# Patient Record
Sex: Female | Born: 1945
Health system: Southern US, Community
[De-identification: ages and names within clinical notes are randomized; demographics above are authoritative.]

## PROBLEM LIST (undated history)

## (undated) DIAGNOSIS — I10 Essential (primary) hypertension: Secondary | ICD-10-CM

## (undated) DIAGNOSIS — E0500 Thyrotoxicosis with diffuse goiter without thyrotoxic crisis or storm: Secondary | ICD-10-CM

## (undated) DIAGNOSIS — Z951 Presence of aortocoronary bypass graft: Secondary | ICD-10-CM

## (undated) DIAGNOSIS — I498 Other specified cardiac arrhythmias: Secondary | ICD-10-CM

## (undated) DIAGNOSIS — F329 Major depressive disorder, single episode, unspecified: Secondary | ICD-10-CM

## (undated) DIAGNOSIS — I2510 Atherosclerotic heart disease of native coronary artery without angina pectoris: Secondary | ICD-10-CM

## (undated) DIAGNOSIS — N183 Chronic kidney disease, stage 3 unspecified: Secondary | ICD-10-CM

## (undated) DIAGNOSIS — E7800 Pure hypercholesterolemia, unspecified: Secondary | ICD-10-CM

## (undated) DIAGNOSIS — J8410 Pulmonary fibrosis, unspecified: Secondary | ICD-10-CM

## (undated) DIAGNOSIS — I509 Heart failure, unspecified: Secondary | ICD-10-CM

## (undated) DIAGNOSIS — E119 Type 2 diabetes mellitus without complications: Secondary | ICD-10-CM

## (undated) DIAGNOSIS — F32A Depression, unspecified: Secondary | ICD-10-CM

## (undated) DIAGNOSIS — J45909 Unspecified asthma, uncomplicated: Secondary | ICD-10-CM

## (undated) DIAGNOSIS — I5030 Unspecified diastolic (congestive) heart failure: Secondary | ICD-10-CM

## (undated) DIAGNOSIS — E785 Hyperlipidemia, unspecified: Secondary | ICD-10-CM

## (undated) DIAGNOSIS — K219 Gastro-esophageal reflux disease without esophagitis: Secondary | ICD-10-CM

## (undated) HISTORY — DX: Pulmonary fibrosis, unspecified: J84.10

## (undated) HISTORY — DX: Presence of aortocoronary bypass graft: Z95.1

## (undated) HISTORY — DX: Thyrotoxicosis with diffuse goiter without thyrotoxic crisis or storm: E05.00

## (undated) HISTORY — DX: Depression, unspecified: F32.A

## (undated) HISTORY — DX: Type 2 diabetes mellitus without complications: E11.9

## (undated) HISTORY — DX: Major depressive disorder, single episode, unspecified: F32.9

## (undated) HISTORY — DX: Gastro-esophageal reflux disease without esophagitis: K21.9

## (undated) HISTORY — DX: Hyperlipidemia, unspecified: E78.5

## (undated) HISTORY — DX: Other specified cardiac arrhythmias: I49.8

## (undated) HISTORY — DX: Essential (primary) hypertension: I10

## (undated) HISTORY — DX: Pure hypercholesterolemia, unspecified: E78.00

---

## 2007-05-20 ENCOUNTER — Ambulatory Visit: Payer: Self-pay | Admitting: Cardiology

## 2007-05-21 ENCOUNTER — Inpatient Hospital Stay (HOSPITAL_COMMUNITY): Admission: AD | Admit: 2007-05-21 | Discharge: 2007-05-23 | Payer: Self-pay | Admitting: Internal Medicine

## 2007-05-21 ENCOUNTER — Ambulatory Visit: Payer: Self-pay | Admitting: Cardiology

## 2007-05-21 ENCOUNTER — Ambulatory Visit: Payer: Self-pay | Admitting: Internal Medicine

## 2007-05-22 ENCOUNTER — Ambulatory Visit: Payer: Self-pay | Admitting: Cardiothoracic Surgery

## 2007-05-22 ENCOUNTER — Encounter: Payer: Self-pay | Admitting: Cardiothoracic Surgery

## 2007-05-23 ENCOUNTER — Encounter: Payer: Self-pay | Admitting: Cardiothoracic Surgery

## 2007-05-29 ENCOUNTER — Inpatient Hospital Stay (HOSPITAL_COMMUNITY): Admission: RE | Admit: 2007-05-29 | Discharge: 2007-06-03 | Payer: Self-pay | Admitting: Cardiothoracic Surgery

## 2007-05-29 HISTORY — PX: CORONARY ARTERY BYPASS GRAFT: SHX141

## 2007-06-13 ENCOUNTER — Ambulatory Visit: Payer: Self-pay | Admitting: Cardiology

## 2007-06-18 ENCOUNTER — Ambulatory Visit: Payer: Self-pay | Admitting: Cardiology

## 2007-06-21 ENCOUNTER — Encounter: Admission: RE | Admit: 2007-06-21 | Discharge: 2007-06-21 | Payer: Self-pay | Admitting: Cardiothoracic Surgery

## 2007-06-21 ENCOUNTER — Ambulatory Visit: Payer: Self-pay | Admitting: Cardiothoracic Surgery

## 2007-08-22 ENCOUNTER — Encounter: Payer: Self-pay | Admitting: Cardiology

## 2007-08-27 ENCOUNTER — Ambulatory Visit: Payer: Self-pay | Admitting: Cardiology

## 2007-09-18 ENCOUNTER — Ambulatory Visit: Payer: Self-pay | Admitting: Cardiology

## 2007-10-03 ENCOUNTER — Ambulatory Visit: Payer: Self-pay | Admitting: Cardiology

## 2008-05-19 ENCOUNTER — Encounter: Payer: Self-pay | Admitting: Physician Assistant

## 2008-05-19 ENCOUNTER — Ambulatory Visit: Payer: Self-pay | Admitting: Cardiology

## 2008-06-02 ENCOUNTER — Encounter: Payer: Self-pay | Admitting: Physician Assistant

## 2008-06-09 IMAGING — CR DG CHEST 1V PORT
1 series · 1 of 1 positions shown · non-contrast
Comparison: 05/30/2007.

CLINICAL DATA: Status post CABG for coronary artery disease.

PORTABLE CHEST - 1 VIEW

[view not recorded]
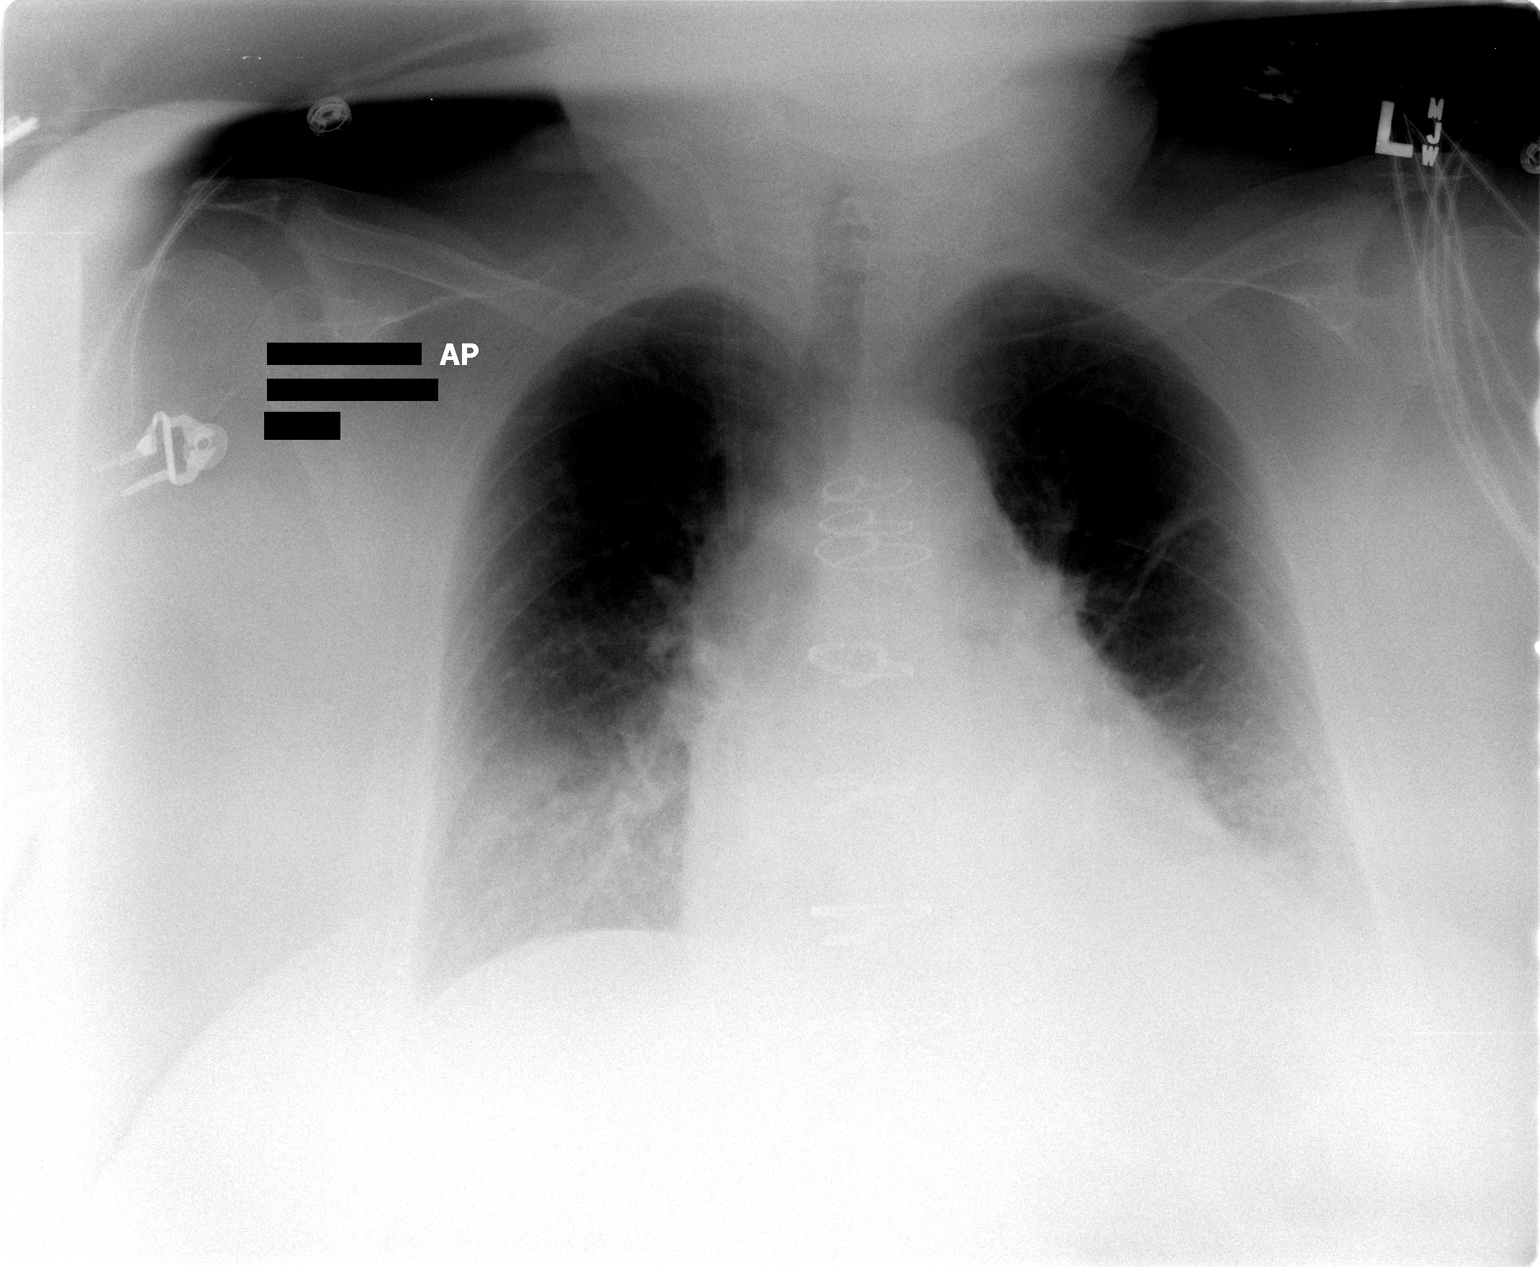

[1 of 1 positions shown; findings below may reference images not displayed]

FINDINGS: Interval removal of the mediastinal and left chest tubes, with no
pneumothorax. Right jugular Swan-Ganz catheter removal with the sheath
remaining. Interval mild left upper lung zone linear atelectasis. Mildly
enlarged cardiac silhouette. Post CABG changes. Stable mild prominence of the
interstitial markings. Increased left lower lobe airspace opacity.  

IMPRESSION

1. Increased left lower lobe atelectasis with interval mild left perihilar
atelectasis.
2. Mild cardiomegaly and mild chronic interstitial lung disease.

## 2009-03-22 ENCOUNTER — Ambulatory Visit: Payer: Self-pay | Admitting: Cardiology

## 2009-03-23 ENCOUNTER — Encounter: Payer: Self-pay | Admitting: Cardiology

## 2009-08-18 DIAGNOSIS — I498 Other specified cardiac arrhythmias: Secondary | ICD-10-CM

## 2009-08-18 DIAGNOSIS — E785 Hyperlipidemia, unspecified: Secondary | ICD-10-CM | POA: Insufficient documentation

## 2009-08-18 DIAGNOSIS — E0500 Thyrotoxicosis with diffuse goiter without thyrotoxic crisis or storm: Secondary | ICD-10-CM | POA: Insufficient documentation

## 2009-08-18 DIAGNOSIS — I10 Essential (primary) hypertension: Secondary | ICD-10-CM

## 2009-10-18 ENCOUNTER — Ambulatory Visit: Payer: Self-pay | Admitting: Cardiology

## 2009-10-18 DIAGNOSIS — J8410 Pulmonary fibrosis, unspecified: Secondary | ICD-10-CM

## 2009-10-18 DIAGNOSIS — R079 Chest pain, unspecified: Secondary | ICD-10-CM

## 2009-10-18 DIAGNOSIS — R609 Edema, unspecified: Secondary | ICD-10-CM | POA: Insufficient documentation

## 2009-10-19 ENCOUNTER — Encounter: Payer: Self-pay | Admitting: Cardiology

## 2009-10-29 ENCOUNTER — Encounter (INDEPENDENT_AMBULATORY_CARE_PROVIDER_SITE_OTHER): Payer: Self-pay | Admitting: *Deleted

## 2010-01-18 ENCOUNTER — Telehealth: Payer: Self-pay | Admitting: Cardiology

## 2010-01-19 ENCOUNTER — Encounter: Payer: Self-pay | Admitting: Cardiology

## 2010-01-19 DIAGNOSIS — E7800 Pure hypercholesterolemia, unspecified: Secondary | ICD-10-CM

## 2010-01-31 ENCOUNTER — Encounter: Payer: Self-pay | Admitting: Cardiology

## 2010-05-26 ENCOUNTER — Ambulatory Visit: Payer: Self-pay | Admitting: Cardiology

## 2010-05-26 ENCOUNTER — Encounter (INDEPENDENT_AMBULATORY_CARE_PROVIDER_SITE_OTHER): Payer: Self-pay | Admitting: *Deleted

## 2010-05-26 DIAGNOSIS — I2510 Atherosclerotic heart disease of native coronary artery without angina pectoris: Secondary | ICD-10-CM | POA: Insufficient documentation

## 2010-05-26 DIAGNOSIS — Z951 Presence of aortocoronary bypass graft: Secondary | ICD-10-CM

## 2010-10-24 ENCOUNTER — Encounter: Payer: Self-pay | Admitting: Cardiology

## 2010-12-16 ENCOUNTER — Encounter: Payer: Self-pay | Admitting: Cardiology

## 2010-12-28 NOTE — Letter (Signed)
Summary: Lexiscan or Dobutamine Pharmacist, community at [facility]  [address]   [place], [place] [address]   Phone: [phone]  Fax: [phone]      [facility]  Lexiscan or Dobutamine Cardiolite Strss Test    [facility]  Appointment Date:_  Appointment Time:_  Your doctor has ordered a CARDIOLITE STRESS TEST using a medication to stimulate exercise so that you will not have to walk on the treadmill to determine the condition of your heart during stress. If you take blood pressure medication, ask your doctor if you should take it the day of your test. You should not have anything to eat or drink at least 4 hours before your test is scheduled, and no caffeine, including decaffeinated tea and coffee, chocolate, and soft drinks for 24 hours before your test.  You will need to register at the Outpatient/Main Entrance at the hospital 15 minutes before your appointment time. It is a good idea to bring a copy of your order with you. They will direct you to the Diagnostic Imaging (Radiology) Department.  You will be asked to undress from the waist up and given a hospital gown to wear, so dress comfortably from the waist down for example: Sweat pants, shorts, or skirt Rubber soled lace up shoes (tennis shoes)  Plan on about three hours from registration to release from the hospital

## 2010-12-28 NOTE — Letter (Signed)
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 [address]   [place], [place] [address]   Phone: [phone]  Fax: [phone]    05/26/2010  TO: Leodis Sias IT MAY CONCERN   RE: [name] [address] [id number]   The above named individual is under my medical care and may return to work on:  Friday, June30, 2011.  She was seen for her 6 month follow up.   If you have any further questions or need additional information, please call.     Sincerely,    Hoover Brunette, LPN

## 2010-12-28 NOTE — Assessment & Plan Note (Signed)
Summary: 6 mo fu per may reminder-srs   Visit Type:  Follow-up Primary Provider:  Hasanaj   History of Present Illness: the patient is a 65 year old female with a history of coronary arteries, status post coronary bypass grafting. During her last office visit we discontinued her beta blocker due to bradycardia. Unfortunately her blood pressure has increased. She denies however any chest pain. 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 She is also not very active and most of the time sits at her job.she also has noticed some swelling in the lower extremities particular towards the evening. She denies any palpitations or syncope. The patient has diabetes and dyslipidemia which is followed by Dr. Olena Leatherwood.  Preventive Screening-Counseling & Management  Alcohol-Tobacco     Smoking Status: quit     Year Quit: 2003  Current Medications (verified): 1)  Lisinopril-Hydrochlorothiazide 20-25 Mg Tabs (Lisinopril-Hydrochlorothiazide) .... Take 1 Tablet By Mouth Once A Day 2)  Metformin Hcl 850 Mg Tabs (Metformin Hcl) .... Take 1 Tablet By Mouth Two Times A Day 3)  Simvastatin 40 Mg Tabs (Simvastatin) .... Take 1 Tablet By Mouth Once A Day 4)  Glimepiride 4 Mg Tabs (Glimepiride) .... Take 1 Tablet By Mouth Once A Day 5)  Aspirin 81 Mg Tbec (Aspirin) .... Take 1 Tablet By Mouth Once A Day 6)  Meloxicam 15 Mg Tabs (Meloxicam) .... Take 1 Tablet By Mouth Once A Day 7)  Fish Oil 1000 Mg Caps (Omega-3 Fatty Acids) .... Take 1 Tablet By Mouth Once A Day 8)  Ocuvite Preservision  Tabs (Multiple Vitamins-Minerals) .... Take 1 Tablet By Mouth Once A Day 9)  Levemir Flexpen 100 Unit/ml Soln (Insulin Detemir) .... Two Times A Day As Directed 10)  Pepcid 20 Mg Tabs (Famotidine) .... Take 1 Tablet By Mouth Once A Day 11)  Ibuprofen 200 Mg Tabs  (Ibuprofen) .... Take 1 Tablet By Mouth Once A Day 12)  Nitrostat 0.4 Mg Subl (Nitroglycerin) .... Use As Directed 13)  Isosorbide Mononitrate Cr 30 Mg Xr24h-Tab (Isosorbide Mononitrate) .... Take 1 Tablet By Mouth Once A Day 14)  Amlodipine Besylate 5 Mg Tabs (Amlodipine Besylate) .... Take 1 Tablet By Mouth Once A Day  Allergies (verified): No Known Drug Allergies  Comments:  Nurse/Medical Assistant: The patient's medication bottles and allergies were reviewed with the patient and were updated in the Medication and Allergy Lists.  Past History:  Past Medical History: Last updated: 10/18/2009 1. Gastroesophageal reflux disease with dysphagia and odynophagia,     resolved post-esophageal dilatation. 2. Coronary artery disease, status post coronary bypass graft in July     2008.     a.     Left internal mammary artery to the left anterior descending      artery, vein graft to the ramus intermediate and obtuse marginal,      vein graft to posterior descending artery. 3. Preserved left ventricular function with an ejection fraction of 60-     65%. 4. Hypertension. 5. Diabetes mellitus. 6. Dyslipidemia. 7. Depression. BRADYCARDIA (ICD-427.89) HYPERLIPIDEMIA-MIXED (ICD-272.4) HYPERTENSION, UNSPECIFIED (ICD-401.9) HYPERTHYROIDISM (GRAVE'S) (ICD-242.00) Depression Diabetes mellitus GERD  Family History: Last updated: 08/18/2009 Family History of Cancer:  Family History of Coronary Artery Disease:   Social History: Last updated: 08/18/2009 Full Time Married  Tobacco Use - Former.  Alcohol Use - no  Risk Factors: Smoking Status: quit (05/26/2010)  Review of Systems       The patient complains of shortness of breath.  The patient denies fatigue, malaise, fever, weight gain/loss, vision loss, decreased hearing, hoarseness, chest pain, palpitations, prolonged cough, wheezing, sleep apnea, coughing up blood, abdominal pain, blood in stool, nausea, vomiting, diarrhea,  heartburn, incontinence, blood in urine, muscle weakness, joint pain, leg swelling, rash, skin lesions, headache, fainting, dizziness, depression, anxiety, enlarged lymph nodes, easy bruising or bleeding, and environmental allergies.    Vital Signs:  Patient profile:   65 year old female Height:      60 inches Weight:      203 pounds O2 Sat:      97 % on Room air Pulse rate:   57 / minute BP sitting:   158 / 81  (left arm) Cuff size:   large  Vitals Entered By: Carlye Grippe (May 26, 2010 9:37 AM)  O2 Flow:  Room air  Physical Exam  Additional Exam:  General: Well-developed, well-nourished in no distress head: Normocephalic and atraumatic eyes PERRLA/EOMI intact, conjunctiva and lids normal nose: No deformity or lesions mouth normal dentition, normal posterior pharynx neck: Supple, no JVD.  No masses, thyromegaly or abnormal cervical nodes lungs: Normal breath sounds bilaterally without wheezing.  Normal percussion heart: regular rate and rhythm with normal S1 and S2, no S3 or S4.  PMI is normal.  No pathological murmurs abdomen: Normal bowel sounds, abdomen is soft and nontender without masses, organomegaly or hernias noted.  No hepatosplenomegaly musculoskeletal: Back normal, normal gait muscle strength and tone normal pulsus: Pulse is normal in all 4 extremities Extremities: No peripheral pitting edema neurologic: Alert and oriented x 3 skin: Intact without lesions or rashes cervical nodes: No significant adenopathy psychologic: Normal affect    Impression & Recommendations:  Problem # 1:  INTERSTITIAL LUNG DISEASE (ICD-515) no clear evidence of interstitial lung disease. The patient had a repeat chest x-ray done which only showed COPD.I do not think she needs a referral at the present time.  Problem # 2:  PURE HYPERCHOLESTEROLEMIA (ICD-272.0) follow up in Dr. Olena Leatherwood Her updated medication list for this problem includes:    Simvastatin 40 Mg Tabs (Simvastatin) .Marland Kitchen...  Take 1 tablet by mouth once a day  Problem # 3:  BRADYCARDIA (ICD-427.89) Assessment: Improved  The following medications were removed from the medication list:    Metoprolol Tartrate 25 Mg Tabs (Metoprolol tartrate) .Marland Kitchen... Take 1 tablet by mouth once a day Her updated medication list for this problem includes:    Lisinopril-hydrochlorothiazide 20-25 Mg Tabs (Lisinopril-hydrochlorothiazide) .Marland Kitchen... Take 1 tablet by mouth once a day    Aspirin 81 Mg Tbec (Aspirin) .Marland Kitchen... Take 1 tablet by mouth once a day    Nitrostat 0.4 Mg Subl (Nitroglycerin) ..... Use as directed    Isosorbide Mononitrate Cr 30 Mg Xr24h-tab (Isosorbide mononitrate) .Marland Kitchen... Take 1 tablet by mouth once a day    Amlodipine Besylate 5 Mg Tabs (Amlodipine besylate) .Marland Kitchen... Take 1 tablet by mouth once a day  Problem # 4:  HYPERTENSION, UNSPECIFIED (ICD-401.9) blood pressure appears to be uncontrolled. We will add amlodipine 5 mg p.o. q. day. The following medications were removed from the medication list:    Metoprolol Tartrate 25 Mg Tabs (Metoprolol tartrate) .Marland Kitchen... Take 1 tablet by mouth once a day Her updated medication list for this problem includes:    Lisinopril-hydrochlorothiazide 20-25 Mg Tabs (Lisinopril-hydrochlorothiazide) .Marland Kitchen... Take 1 tablet by mouth once a day    Aspirin 81 Mg Tbec (Aspirin) .Marland KitchenMarland KitchenMarland KitchenMarland Kitchen  Take 1 tablet by mouth once a day    Amlodipine Besylate 5 Mg Tabs (Amlodipine besylate) .Marland Kitchen... Take 1 tablet by mouth once a day  Problem # 5:  CORONARY ARTERY BYPASS GRAFT, HX OF (ICD-V45.81) the patient had coronary bypass grafting in 2008. She is due for a last stress study in 6 months.  Patient Instructions: 1)  Amlodipine 5 mg daily 2)  Lexiscan stress test in 6 months just before next office visit  3)  Follow up in  6 months Prescriptions: AMLODIPINE BESYLATE 5 MG TABS (AMLODIPINE BESYLATE) Take 1 tablet by mouth once a day  #30 x 6   Entered by:   Hoover Brunette, LPN   Authorized by:   Lewayne Bunting, MD, [facility]   Signed by:    Hoover Brunette, LPN on 16/08/9603   Method used:   Electronically to        [facility] (retail)       [address]       [place], [place]  [address]       Ph: [phone]       Fax: [phone]   RxID:   [id number]   Handout requested.

## 2010-12-28 NOTE — Miscellaneous (Signed)
Summary: Orders Update  Clinical Lists Changes  Problems: Added new problem of PURE HYPERCHOLESTEROLEMIA (ICD-272.0) Added new problem of ENCOUNTER FOR LONG-TERM USE OF OTHER MEDICATIONS (ICD-V58.69) Orders: Added new Test order of T-TSH [phone]) - Signed Added new Test order of T-T4, Free [phone]) - Signed

## 2010-12-28 NOTE — Progress Notes (Signed)
Summary: PHONE: THYROID TESTING  Phone Note Call from Patient Call back at Home Phone [phone]   Caller: Patient Summary of Call: Patient was told to call office in regards to having a thyroid test scheduled. She would like the order to be written and she wants to pick it up. She works for [employer] and she can get all blood work done free.  please call her [phone]. Initial call taken by: Zachary George,  January 18, 2010 2:56 PM  Follow-up for Phone Call        Order T4 and TSH Follow-up by: Lewayne Bunting, MD, [facility],  January 19, 2010 4:05 PM

## 2010-12-29 NOTE — Letter (Signed)
Summary: APPT WITH INST FOR LEXISCAN TEST  APPT WITH INST FOR LEXISCAN TEST   Imported By: Zachary George 12/16/2010 15:19:34  _____________________________________________________________________  External Attachment:    Type:   Image     Comment:   External Document

## 2010-12-29 NOTE — Miscellaneous (Signed)
Summary: Orders Update  Clinical Lists Changes  Orders: Added new Referral order of Nuclear Med (Nuc Med) - Signed 

## 2011-01-03 ENCOUNTER — Telehealth (INDEPENDENT_AMBULATORY_CARE_PROVIDER_SITE_OTHER): Payer: Self-pay | Admitting: *Deleted

## 2011-01-09 ENCOUNTER — Encounter: Payer: Self-pay | Admitting: Cardiology

## 2011-01-09 DIAGNOSIS — I2510 Atherosclerotic heart disease of native coronary artery without angina pectoris: Secondary | ICD-10-CM

## 2011-01-18 NOTE — Progress Notes (Signed)
Summary: stress test question  Phone Note Call from Patient Call back at cell:  [phone]   Summary of Call: Has bacterial infection in left lower shin due to her Diabetes.  PMD (Hasanaj) put on Ciprofloxacen 750mg  two times a day.  Will still be on ABO during time of her stress testing.    Just wanted to make sure okay and would not interfere with stress test.  Advised pt - should not be a problem, but will forward message to MD to be sure.  Having 2 day stress test on 2/13 & 14.  Patient verbalized understanding.  Initial call taken by: Hoover Brunette, LPN,  January 03, 2011 11:08 AM  Follow-up for Phone Call        Yes as long as she doesn't have a high-grade fever and significant pain the should not interfere with stress testing  Follow-up by: Lewayne Bunting, MD, [facility],  January 08, 2011 4:14 AM  Additional Follow-up for Phone Call Additional follow up Details #1::        Patient notified.    Additional Follow-up by: Hoover Brunette, LPN,  January 09, 2011 5:01 PM

## 2011-01-19 ENCOUNTER — Ambulatory Visit: Payer: Self-pay | Admitting: Cardiology

## 2011-03-16 ENCOUNTER — Encounter: Payer: Self-pay | Admitting: Cardiology

## 2011-03-17 ENCOUNTER — Ambulatory Visit (INDEPENDENT_AMBULATORY_CARE_PROVIDER_SITE_OTHER): Payer: 59 | Admitting: Cardiology

## 2011-03-17 VITALS — BP 130/70 | HR 85

## 2011-03-17 DIAGNOSIS — R609 Edema, unspecified: Secondary | ICD-10-CM

## 2011-03-17 DIAGNOSIS — I2510 Atherosclerotic heart disease of native coronary artery without angina pectoris: Secondary | ICD-10-CM

## 2011-03-17 DIAGNOSIS — K219 Gastro-esophageal reflux disease without esophagitis: Secondary | ICD-10-CM

## 2011-03-17 DIAGNOSIS — Z951 Presence of aortocoronary bypass graft: Secondary | ICD-10-CM

## 2011-03-17 NOTE — Patient Instructions (Signed)
   Your physician wants you to follow-up in: 6 months. You will receive a reminder letter in the mail one-two months in advance. If you don't receive a letter, please call our office to schedule the follow-up appointment.  Your physician recommends that you continue on your current medications as directed. Please refer to the Current Medication list given to you today. Your physician has requested that you have a lower or upper extremity venous duplex. This test is an ultrasound of the veins in the legs or arms. It looks at venous blood flow that carries blood from the heart to the legs or arms. Allow one hour for a Lower Venous exam. Allow thirty minutes for an Upper Venous exam. There are no restrictions or special instructions. If the results of your test are normal or stable, you will receive a letter. If they are abnormal, the nurse will contact you by phone.

## 2011-03-18 DIAGNOSIS — K219 Gastro-esophageal reflux disease without esophagitis: Secondary | ICD-10-CM | POA: Insufficient documentation

## 2011-03-18 NOTE — Progress Notes (Signed)
HPI The patient had a recent equivocal LV perfusion study. Ejection fraction was 70%. Study was read by Dr. Diona Browner. There was a small partially reversible defect in the apical anterior and apical septal segments. There was a small partially reversible defect in the basal inferolateral and mid inferolateral segments. This was possibly felt to soft tissue attenuation. Ischemia cannot be completely ruled out. The patient was last seen in June of 2011. She has a history of coronary artery disease and is status post coronary bypass grafting. In the past he had to stop her beta blocker due to bradycardia. Discuss increasing her blood pressure during her last office visit. Chest his of chronic stable shortness of breath on exertion mainly due to deconditioning. 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 She has a history of lower extremity edema in the evening. The patient feels very depressed because her brother died couple months ago. He also had heart disease. He has had open heart surgery x2. The patient however states that she doing well from a cardiovascular perspective. She denies any chest pain or shortness of breath. She has no orthopnea or PND. She has no palpitations or syncope. She does report ongoing swelling in the left lower extremity and some excoriations and raw skin on the left shin.  No Known Allergies  Current Outpatient Prescriptions on File Prior to Visit  Medication Sig Dispense Refill  . amLODipine (NORVASC) 5 MG tablet Take 5 mg by mouth daily.        Marland Kitchen aspirin 81 MG tablet Take 81 mg by mouth daily.        Marland Kitchen glimepiride (AMARYL) 4 MG tablet Take 4 mg by mouth daily before breakfast.        . ibuprofen (ADVIL,MOTRIN) 200 MG tablet Take 200 mg by mouth every 6 (six) hours as needed.        Marland Kitchen lisinopril-hydrochlorothiazide (PRINZIDE,ZESTORETIC) 20-25 MG per tablet Take 1 tablet by mouth daily.        . meloxicam  (MOBIC) 15 MG tablet Take 15 mg by mouth daily.        . Multiple Vitamins-Minerals (OCUVITE PRESERVISION PO) Take 1 tablet by mouth daily.        . nitroGLYCERIN (NITROSTAT) 0.4 MG SL tablet Place 0.4 mg under the tongue every 5 (five) minutes as needed.        . Omega-3 Fatty Acids (FISH OIL) 1000 MG CAPS Take 1 capsule by mouth 2 (two) times daily.       . simvastatin (ZOCOR) 40 MG tablet Take 40 mg by mouth at bedtime.          Past Medical History  Diagnosis Date  . Other specified cardiac dysrhythmias   . Other and unspecified hyperlipidemia   . Unspecified essential hypertension   . 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 Type II or unspecified type diabetes mellitus without mention of complication, not stated as uncontrolled   . GERD (gastroesophageal reflux disease)   . Unspecified essential hypertension   . Depression   . Postinflammatory pulmonary fibrosis   . Pure hypercholesterolemia   . Other specified cardiac dysrhythmias   . Postsurgical aortocoronary bypass status     Past Surgical History  Procedure Date  . Coronary artery bypass graft   05/29/2007     Family History  Problem Relation Age of Onset  . Cancer Other  Family Hx of Cancer  . Coronary artery disease Other     Family Hx of CAD    History   Social History  . Marital Status: Divorced    Spouse Name: N/A    Number of Children: N/A  . Years of Education: N/A   Occupational History  . RETIRED    Social History Main Topics  . Smoking status: Former Smoker    Quit date: 11/27/2001  . Smokeless tobacco: Not on file  . Alcohol Use: No  . Drug Use: No  . Sexually Active: Not on file   Other Topics Concern  . Not on file   Social History Narrative  . No narrative on file   [id number] positives as outlined above. The remainder of the 18  point review of systems is negative   PHYSICAL EXAM There were no vitals taken for this visit. General:  Well-developed, well-nourished in no distress Head: Normocephalic and atraumatic Eyes:PERRLA/EOMI intact, conjunctiva and lids normal Ears: No deformity or lesions Mouth:normal dentition, normal posterior pharynx Neck: Supple, no JVD.  No masses, thyromegaly or abnormal cervical nodes Lungs: Normal breath sounds bilaterally without wheezing.  Normal percussion Cardiac: regular rate and rhythm with normal S1 and S2, no S3 or S4.  PMI is normal.  No pathological murmurs Abdomen: Normal bowel sounds, abdomen is soft and nontender without masses, organomegaly or hernias noted.  No hepatosplenomegaly MSK: Back normal, normal gait muscle strength and tone normal Vascular: Pulse is normal in all 4 extremities Extremities: left lower extremity swelling Neurologic: Alert and oriented x 3 Skin: Intact without lesions or rashes Lymphatics: No significant adenopathy Psychologic: Normal affect   ECG:EKG normal sinus rhythm no acute abnormalities ASSESSMENT AND PLAN

## 2011-03-18 NOTE — Assessment & Plan Note (Signed)
Swelling left lower extremity we'll proceed with Dopplers to rule out DVT.

## 2011-04-11 NOTE — Assessment & Plan Note (Signed)
[facility]                          [facility] OFFICE NOTE   NAME:[name], [name]                    MRN:          [id number]  DATE:05/19/2008                            DOB:          1946/04/19    CARDIOLOGIST:  Learta Codding, MD,FACC   REASON FOR VISIT:  85-month followup.   PRESENT ILLNESS:  [name] is a 65 year old female patient with a  history of CAD status post CABG in July 2008 who presents to office  today for followup.  Overall, she is doing well.  She denies any anginal  symptoms, significant shortness of breath, orthopnea, PND, pedal edema,  syncope, or near-syncope.  She does note some palpitations; she has had  these for years.  They are usually in the mornings.  It sounds as though  she is describing premature atrial versus premature ventricular  contractions.  She denies any lightheadedness or near-syncope with  these.  She does note some fatigue.  This has been minimal over the last  several months.  She also notes symptoms of dysphagia and odynophagia.  She has had significant symptoms of dyspepsia several times a week for  quite some time now.  She takes Tums on an almost daily basis.  She  denies any hematemesis, melena, or hematochezia.  She notes that her son  was recently deployed to [place].  He is a Recruitment consultant and a  Engineer, technical sales with the [employer]   CURRENT MEDICATIONS:  1. Xanax 0.5 mg nightly.  2. Glucophage 850 mg b.i.d.  3. Simvastatin 40 mg nightly.  4. Glimepiride 4 mg daily.  5. Lisinopril HCTZ 20/12.5 mg daily.  6. Metoprolol tartrate 50 mg once daily.  7. Aspirin 81 mg daily.  8. PreserVision daily.  9. Sertraline 50 mg 2 tablets daily.   PHYSICAL EXAMINATION:  GENERAL:  She is a well-nourished, well-developed  female in no acute distress.  VITAL SIGNS:  Blood pressure is 125/66, pulse 51, and weight 206 pounds.  HEENT:  Normal.  NECK:  Without JVD.  CARDIAC:  Normal S1 and S2,  regular rate and rhythm.  LUNGS:  Clear to auscultation bilaterally.  ABDOMEN:  Soft, nontender.  EXTREMITIES:  Without edema.  NEUROLOGIC:  She is alert and oriented x3.  Cranial II-XII grossly  intact.  VASCULAR:  No carotid artery bruits noted bilaterally.   Electrocardiogram reveals sinus bradycardia, heart rate of 50, normal  axis, no acute changes.   IMPRESSION:  1. Gastroesophageal reflux disease with symptoms of dysphagia and      odynophagia.  2. Longstanding palpitations.      a.     Suspect premature atrial contractions versus premature       ventricular contractions  3. Coronary artery disease, status post coronary artery bypass graft      in July 2008.      a.     Left internal mammary artery to the left anterior       descending, vein graft to the ramus intermediate and obtuse       marginal, vein graft to the posterior descending.  4. Preserved left  ventricular function with an ejection fraction of 60-      65%.  5. Hypertension.  6. Diabetes mellitus.  7. Dyslipidemia.  8. Depression.   PLAN:  1. Ms. Huseby presents for routine followup.  She has had some      palpitations, but she has had this for years without change.  She      is currently taking her metoprolol 50 mg once a day.  This is      immediate-release formulation, and I have asked her to change this      to a half a tablet twice a day.  2. She seems to be asymptomatic with her sinus bradycardia.  She does      feel somewhat fatigued.  We will check labs as listed below.  If      she continues to feel fatigued on the change in her metoprolol we      can certainly change that to a lower dosage in the future.  She      knows to contact us.  3. Lab work will be done today:  CMET, CBC, TSH, magnesium, and a      lipid panel.  4. She has dysphagia associated with acid reflux disease.  I have      placed her on Protonix 40 mg a day and recommend that she see      Gastroenterology for further  recommendations.  We will facilitate      that referral.  5. The patient will return in 6 months for routine followup.  She      knows to contact us for sooner followup should she have a change in      her symptoms.      Tereso Newcomer, PA-C  Electronically Signed      Learta Codding, MD,FACC  Electronically Signed   SW/MedQ  DD: 05/19/2008  DT: 05/20/2008  Job #: [id number]

## 2011-04-11 NOTE — Assessment & Plan Note (Signed)
[facility]                          [facility]ICE NOTE   NAME:[name], [name]                    MRN:          [id number]  DATE:06/18/2007                            DOB:          Oct 13, 1946    REFERRING PHYSICIAN:  Lia Hopping   HISTORY OF PRESENT ILLNESS:  The patient is a 65 year old female with  diabetes who was seen at [facility] for new onset of substernal  chest pain. The patient was referred for catheterization and was found  to have significant coronary artery disease involving the left main  coronary artery and the LAD ostium. The patient was referred for  coronary bypass grafting. The patient underwent coronary artery bypass  grafting x4 using a left internal mammary artery to the LAD, sequential  saphenous vein, the ramus intermediate and obtuse marginals, saphenous  vein graft to posterior descending artery. The patient has been doing  well since that time. She denies any shortness of breath, orthopnea or  PND. She does have chest pain upon movement and pressure on the chest.  This appears to be related to a residual sternotomy pain. She also notes  that she gained seven pounds since she discontinued her Lasix.   MEDICATIONS:  1. Aspirin 325 mg a day.  2. Toprol XL 25 mg p.o. daily.  3. Ramipril 2.5 mg p.o. daily.  4. Crestor 10 mg p.o. daily.  5. Glimepiride 4 mg a day.  6. Xanax 0.5 mg a day.  7. Elavil 25 mg p.o. nightly.  8. Glucophage 850 mg p.o. b.i.d.   PHYSICAL EXAMINATION:  VITAL SIGNS: Blood pressure 127/82, heart rate 70  beats per minute, saturation is 95% on room air.  GENERAL: Well-nourished white female in no apparent distress.  NECK: Normal carotid upstroke. No carotid bruits.  HEENT: Normal.  LUNGS:  Clear bilaterally.  HEART: Regular rate and rhythm. Tenderness at the sternotomy site, but  no evidence of inflammation or drainage.  ABDOMEN: Soft.  EXTREMITIES: No cyanosis, clubbing or  edema.   PROBLEM LIST:  1. Coronary artery disease status post coronary artery bypass graft      May 29, 2007.  2. Preserved LV systolic function.  3. Multiple cardiac risk factors including diabetes mellitus,      hypertension and previous tobacco use.  4. Dyslipidemia.   PLAN:  1. The patient does demonstrate some weight gain. The patient will      need 10 mg of Lasix every day for the next 7-10 days. She will      monitor her weight and discontinue if she is back down to her dry      weight.  2. Will also provide the patient with additional pain relief as she      still has some residual sternotomy pain. I have written for a      prescription for Darvocet N100.  3. The patient will be referred for a chest x-ray and P.A. and lateral      post coronary artery bypass grafting and will be reviewed.  4. The patient will followup next month with Dr.  Donata Clay.  Learta Codding, MD,FACC  Electronically Signed    GED/MedQ  DD: 06/18/2007  DT: 06/18/2007  Job #: [id number]   cc:   Lia Hopping

## 2011-04-11 NOTE — Op Note (Signed)
[name], [name]           ACCOUNT NO.:  [id number]   MEDICAL RECORD NO.:  [id number]          PATIENT TYPE:  INP   LOCATION:  2309                         FACILITY:  [facility]   PHYSICIAN:  Kerin Perna, M.D.  DATE OF BIRTH:  12-22-1945   DATE OF PROCEDURE:  05/29/2007  DATE OF DISCHARGE:                               OPERATIVE REPORT   OPERATION:  1. Coronary artery bypass grafting x4 (left internal mammary artery to      the left anterior descending, sequential saphenous vein graft to      ramus intermediate and obtuse marginal, saphenous vein graft to      posterior descending).  2. Endoscopic vein harvest of the greater saphenous vein from the      right leg.   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   SURGEON:  Kerin Perna, M.D.   ASSISTANT:  Danelle Earthly, PA-C and Alm Bustard, [place]   ANESTHESIA:  General.   INDICATIONS:  The patient is a 65 year old obese diabetic with  exertional chest pain.  Cardiac catheterization by Dr. Tonny Bollman  demonstrated a distal 50% left main stenosis with a high-grade stenosis  of the LAD and ramus intermediate and moderate stenosis of the posterior  descending and the distal right coronary artery.  Overall EF was  preserved.  She is felt to be candidate for surgical evaluation.  Prior  to surgery I examined the patient in her hospital room and reviewed  results of the cardiac cath with the patient and family.  I discussed  indications and benefits coronary bypass surgery as well as  alternatives.  I discussed the major aspects of the operation including  the choice of conduit to include internal mammary artery and  endoscopically harvested saphenous vein, location of the surgical  incisions, use of general anesthesia and cardiopulmonary bypass, and  expected of postoperative recovery.  I reviewed the risks of MI, CVA,  bleeding, infection, and death.  The patient  demonstrated her  understanding and agreed to proceed with operation under what I felt was  an informed consent.   OPERATIVE FINDINGS:  The vein was of good quality.  It became fairly  small below the knee as it bifurcated.  The mammary artery was good  vessel with excellent flow.  The LAD was a somewhat short vessel that  bifurcated at the midseptum.  The distal right coronary had several  small branches in the posterolateral distribution which were too small  to graft.  These had approximately 50% stenosis according to the cath  report.  The patient did not require any blood transfusions for the  surgery.   PROCEDURE:  The patient was brought to operative room, placed the  operating table where general anesthesia was induced under invasive  hemodynamic monitoring.  The chest, abdomen and legs were prepped with  Betadine and draped as a sterile field.  The sternal incision was made  as the saphenous vein was harvested endoscopically.  The left internal  mammary artery was harvested as a pedicle graft from its origin at the  subclavian vessels and was a good vessel with excellent flow.  Heparin  was administered and the ACT was documented as being therapeutic.  The  sternal retractor was placed using the deep blades due to the patient's  obese morphology.  Exposure the heart and the ascending aorta was  difficult.  Pursestrings were placed in the ascending aorta and right  atrium.  The patient was cannulated.  After the vein was inspected and  found to be adequate the patient was placed on bypass.  The coronaries  were identified for grafting.  The LAD before its bifurcation, the ramus  intermediate just under the left atrial appendage, and the circumflex  marginal were found to be adequate grafts.  The posterior descending was  distally diseased but was an adequate target.  The vessels beyond the  posterior descending and the posterolateral distribution were too small  to graft.  A  cardioplegia catheter was placed in the ascending aorta.  The patient was cooled to 32 degrees.  Aortic crossclamp was applied and  800 mL of cold blood cardioplegia was delivered with good cardioplegic  arrest.  Cardioplegia was delivered every 20 minutes during the period  of crossclamp.   The distal coronary anastomoses were performed.  The first distal  anastomosis was to the mid posterior descending.  This had a proximal  70% stenosis of thick plaque.  A reverse saphenous vein sewn end-to-side  with running 7-0 Prolene.  There is good flow through graft.  The second  distal anastomosis was the sequential vein grafts during ramus  intermediate continuing to the obtuse marginal.  The ramus intermediate  was 1.5 mm vessel with proximal 80% stenosis.  A side-to-side  anastomosis with the vein was constructed with 7-0 Prolene.  The third  distal anastomosis was the continuation of the sequential vein in the  OM1.  This a 1.5-mm vessel with proximal 70% stenosis and the end of the  vein was sewn end-to-side with running 7-0 Prolene.  There was good flow  through the graft.  The fourth distal anastomosis was to the mid-LAD  before it bifurcated at the mid septum.  The left IMA pedicle was  brought through an opening in the left pericardium was brought down onto  the LAD and sewn end-to-side with running 8-0 Prolene.  There is good  flow through the anastomosis after briefly releasing the pedicle bulldog  on the mammary artery.  The bulldog was reapplied and the pedicle was  secured to epicardium.  Cardioplegia was redosed.   While the crossclamp still in place two proximal vein anastomoses were  performed on the ascending aorta using a 4.0-mm punch running 6-0  Prolene.  Prior to tying down the final proximal anastomosis, air was  vented from the coronaries with the usual de-airing maneuvers on bypass.  The crossclamp was then removed.   The heart resumed a spontaneous rhythm.  Air was  aspirated from the vein  grafts with 27 gauge needle.  The grafts were opened.  Each had good  flow and hemostasis was documented at the proximal distal sites.  The  patient was rewarmed to 37 degrees.  Temporary pacing wires were  applied.  The lungs re-expanded.  The ventilator was resumed.  The  patient was then weaned from bypass without difficulty.  Cardiac output  blood pressure stable.  Protamine was administered without adverse  reaction.  The cannulas were removed.  The mediastinum was irrigated  warm antibiotic irrigation.  Leg incision was  irrigated, closed in a  standard fashion.  The superior pericardial fat was closed over the  aorta.  Two mediastinal and left pleural chest tube were placed, brought  out through separate incisions.  The sternum was closed with interrupted  steel wire.  The pectoralis fascia was closed interrupted and running #1  Vicryl.  Subcutaneous and skin layers were closed in running Vicryl and  sterile dressings were applied.  Total bypass time was 120 minutes,  crossclamp time of 84 minutes.  Due to the patient's obese morphology  and difficult cardiac exposure, redo surgery would be at high risk if at  all possible.  No blood products were used for this operation.      Kerin Perna, M.D.  Electronically Signed     PV/MEDQ  D:  05/29/2007  T:  05/30/2007  Job:  [id number]   cc:   Veverly Fells. Excell Seltzer, MD

## 2011-04-11 NOTE — Assessment & Plan Note (Signed)
[facility]                          [facility]ICE NOTE   NAME:Nellums, Myya                    MRN:          [id number]  DATE:03/22/2009                            DOB:          1946/11/11    PRIMARY CARE PHYSICIAN:  Dr. Lia Hopping.   HISTORY OF PRESENT ILLNESS:  The patient is a 65 year old female with a  history of coronary artery disease and coronary bypass grafting.  The  patient is doing well from cardiac perspective.  She denies any chest  pain, shortness of breath, orthopnea, or PND.  She did have couple of  falls, but she stated it occur each time when she was stripping.  She  had no loss of consciousness.   MEDICATIONS:  1. Xanax 0.5 mg p.o. nightly.  2. Glucophage 850 mg p.o. b.i.d.  3. Simvastatin 40 mg p.o. nightly.  4. Glimepiride 4 mg p.o. daily.  5. Lisinopril and hydrochlorothiazide 20/12.5 mg p.o. daily.  6. Metoprolol tartrate 15 mg p.o. daily.  7. Aspirin 81 mg p.o. daily.  8. Sertraline 50 mg 1 tablet p.o. daily.   PHYSICAL EXAMINATION:  VITAL SIGNS:  Blood pressure 160/65, heart rate  46, respirations are 18, and weight is 207 pounds.  NECK:  Normal carotid upstroke.  No carotid bruits.  LUNGS:  Clear breath sounds bilaterally.  HEART:  Regular rate and rhythm.  Normal S1 and S2.  No murmur, rubs, or  gallops.  ABDOMEN:  Soft and nontender.  No rebound or guarding.  Good bowel  sounds.  EXTREMITIES:  No cyanosis, clubbing, or edema.   PROBLEMS:  1. Gastroesophageal reflux disease with dysphagia and odynophagia,      resolved post-esophageal dilatation.  2. Coronary artery disease, status post coronary bypass graft in July      2008.      a.     Left internal mammary artery to the left anterior descending       artery, vein graft to the ramus intermediate and obtuse marginal,       vein graft to posterior descending artery.  3. Preserved left ventricular function with an ejection fraction of 60-  65%.  4. Hypertension.  5. Diabetes mellitus.  6. Dyslipidemia.  7. Depression.  8. Bradycardia.   PLAN:  1. I have asked the patient to cut her metoprolol in quarters and take      12.5 in the morning and 12.5 in the evening.  She really does not      have symptomatic bradycardia, however.  2. The patient does not need any further ischemia testing.  At the      present time, she has no angina symptoms.  3. The patient does have borderline hypertension and I added 12.5 mg      of hydrochlorothiazide to her lisinopril and hydrochlorothiazide      20/12.5 mg a day, which then can be refilled as 20/25 in the      future.     Learta Codding, MD,FACC  Electronically Signed    GED/MedQ  DD: 03/22/2009  DT: 03/23/2009  Job #: [id number]  cc:   Stoney Bang

## 2011-04-11 NOTE — Discharge Summary (Signed)
[name], FLOOR           ACCOUNT NO.:  [id number]   MEDICAL RECORD NO.:  [id number]          PATIENT TYPE:  INP   LOCATION:  2025                         FACILITY:  [facility]   PHYSICIAN:  Kerin Perna, M.D.  DATE OF BIRTH:  [birth year]   DATE OF ADMISSION:  05/29/2007  DATE OF DISCHARGE:                               DISCHARGE SUMMARY   FINAL DIAGNOSES:  Left main stenosis with class III progressive angina.   HOSPITAL DIAGNOSES:  1. Volume overload postoperatively.  2. Acute blood loss anemia postoperatively.   SECONDARY DIAGNOSES:  1. Diabetes mellitus.  2. History of tobacco use, quit smoking 4 years ago.  3. Status post hysterectomy.  4. Questionable history of left leg deep vein thrombosis during early      pregnancy.   OPERATIONS AND PROCEDURES:  1. Coronary bypass grafting x4 using a left internal mammary artery to      left anterior descending, sequential saphenous vein graft to ramus      intermediate and obtuse marginal, saphenous vein graft to posterior      descending.   HISTORY AND PHYSICAL AND HOSPITAL COURSE:  The patient is a 65 year old  obese diabetic with exertional chest pain.  Cardiac catheterization done  by Dr. Excell Seltzer demonstrated 50% left main stenosis with high-grade  stenosis of the LAD and ramus intermediate and moderate stenosis of  posterior descending and distal right coronary artery.  Overall function  was preserved.  Following catheterization, Dr. Donata Clay was consulted.  Dr. Donata Clay saw and evaluated the patient.  He discussed with the  patient undergoing coronary bypass grafting.  Risks, benefits were  discussed with the patient.  The patient acknowledge her understanding  and agreed to proceed.  Surgery was scheduled for May 29, 2007.  For  details of the patient's past medical history and physical exam, please  see dictated H&P.   The patient was taken to the operating room May 29, 2007, where she  underwent coronary bypass  grafting x4 using a left internal mammary  artery to left anterior descending, sequential saphenous vein graft to  ramus intermediate and obtuse marginal, saphenous vein graft to  posterior descending.  Endoscopic vein harvesting of saphenous vein from  the right leg was done.  The patient tolerated this procedure well and  was transferred to the intensive care unit in stable condition.  Postoperatively, the patient was noted to be hemodynamically stable.  She was extubated evening of surgery.  Post extubation, the patient  noted to be alert and oriented x4, neurologically intact.  The patient's  postoperative course was pretty much unremarkable.   Postop day #1, the patient's vital signs were noted to be stable.  She  was able to be weaned from all drips.  Chest x-ray postoperatively was  stable with no significant effusion.  Chest tubes and catheter were  discontinued   By postop day #2, the patient was out of bed and ambulating well.  She  continued to progress.  She was transferred up to 2000 postop day #3.  During the patient's postoperative course, she did develop slight volume  overload  requiring several doses of Lasix.  The patient was back near  baseline prior to discharge home.  The patient also had acute blood loss  anemia with hemoglobin and hematocrit dropping to 10.8 and 31.5,  respectively, by postop day #4.  The patient was asymptomatic and  remained stable.  The patient's vital signs were continued to be  monitored.  They remained stable.  The patient remained afebrile.  She  was able to be weaned off oxygen, saturating greater than 90% on room  air.   Postoperatively, the patient's blood sugars were followed due to history  of diabetes mellitus.  She was initially started on Lantus insulin but  then restarted on the patient's diabetic p.o. medications..  The  patient's sugars remained stable.  The patient remained in normal sinus  rhythm postoperatively.  Her  pulmonary status remained stable.  She  continued to use her incentive spirometer.  The patient's incisions were  clean, dry and intact and healing well.   Labs on postop day #4 showed a white count 10.16, hemoglobin 7.8,  hematocrit 31.7, platelet count 222.  Sodium of 139, potassium 3.6,  chloride 96, bicarbonate 31, BUN 11, creatinine 0.78, glucose  85.   The patient is ready for discharge home today,  June 03, 2007, postop day  #5.   FOLLOWUP APPOINTMENTS:  A followup appointment has been arranged with  Dr. Donata Clay  for June 25, 2007, at 11 a.m.  The patient is to follow  up with Dr. Excell Seltzer in 2 weeks.  She will contact Dr. Earmon Phoenix office to  make this followup appointment.  Plan for the patient is to follow up in  [place].  She will have a 2-D echocardiogram done at that appointment for  evaluation.   ACTIVITY:  The patient instructed no driving until released to do so, no  lifting over 10 pounds.  The patient is told to ambulate 3-4 times a  day, progress as tolerated, and to continue her breathing exercises.   INCISIONAL CARE:  The patient is told to shower, washing her incisions  using soap and water.  She is to contact the office if she develops any  drainage or opening from her incision sites.   DIET:  The patient instructed on diet to be low-fat, low-salt as  carbohydrate-modified medium calorie diet.   DISCHARGE MEDICATIONS:  1. Toprol XL 25 mg daily.  2. Ramipril 2.5 mg daily.  3. Crestor 10 mg at night.  4. Glimepiride 4 mg daily.  5. Xanax 0.5 mg nightly.  6. Elavil 25 mg nightly.  7. Glucophage 850 mg b.i.d.  8. Lasix 40 mg daily x7 days.  9. Potassium chloride 20 mEq daily x7 days.  10.Oxycodone 5 mg 1-2 tablets q. 4-6 h. p.r.n. pain.      [name], [place]      Kerin Perna, M.D.  Electronically Signed    KMD/MEDQ  D:  06/03/2007  T:  06/03/2007  Job:  [id number]   cc:   Veverly Fells. Excell Seltzer, MD

## 2011-04-11 NOTE — Assessment & Plan Note (Signed)
[facility]                          [facility] NOTE   NAME:[name], [name]                    MRN:          [id number]  DATE:09/18/2007                            DOB:          January 09, 1946    REFERRING PHYSICIAN:  Lia Hopping, M.D.   HISTORY OF PRESENT ILLNESS:  The patient is a 65 year old female with a  history of diabetes mellitus and coronary artery disease.  The patient  is status post coronary artery bypass grafting by Kerin Perna, M.D.  The patient has been doing well.  She reports no substernal chest pain,  shortness of breath, orthopnea, or PND.  Unfortunately her blood  pressure is elevated in the office today. She also has noticed some  increasing peripheral edema.   MEDICATIONS:  1. Aspirin 325 mg daily.  2. Toprol XL 25 mg p.o. daily.  3. Ramipril 2.5 mg daily.  4. Glimepiride 4 mg p.o. daily.  5. Xanax 0.5 mg p.o. daily.  6. Glucophage 850 mg p.o. b.i.d.  7. Simvastatin 40 mg p.o. q.h.s.   PHYSICAL EXAMINATION:  VITAL SIGNS:  Blood pressure 165/80, heart rate  __________ beats per minute, weight 209 pounds.  NECK:  Normal carotid upstroke, no carotid bruits.  LUNGS:  Clear breath sounds bilaterally.  HEART:  Regular rate and rhythm.  Normal S1 and S2.  No murmurs, rubs,  or gallops.  ABDOMEN:  Soft and nontender with no rebound or guarding.  Good bowel  sounds.  EXTREMITIES:  No cyanosis, clubbing, or edema.  NEUROLOGY:  The patient is alert and oriented and grossly nonfocal.   PROBLEM LIST:  1. Coronary artery disease status post coronary artery bypass      grafting.  2. Preserved left ventricular function.  3. Cardiac risk factors.  4. Hypertension, uncontrolled.  5. Diabetes mellitus.  6. Dyslipidemia on statin drug therapy and followed by Dr. Olena Leatherwood.  7. Rule out foreign body reaction secondary to sternal suture.   PLAN:  1. The patient has a small area of tenderness around the sternotomy  site which may well be from a protruding suture.  This possibly      caused a foreign body reaction.  I told the patient to monitor this      for the next couple of weeks and if persistent we will refer her to      Dr. Kerin Perna, M.D.  2. The patient will have her blood pressure medication adjusted and we      will switch her to lisinopril 20/12.5 mg hydrochlorothiazide p.o.      daily.  3. The patient will come back in one week for a nurse visit to have      blood pressure check.  4. The patient can otherwise follow up with [place] in six months.     Learta Codding, MD,FACC  Electronically Signed    GED/MedQ  DD: 09/18/2007  DT: 09/19/2007  Job #: [id number]   cc:   Lia Hopping

## 2011-04-11 NOTE — Assessment & Plan Note (Signed)
OFFICE VISIT   TAYLORMARIE, REGISTER  DOB:  Apr 18, 1946                                        June 21, 2007  CHART #:  [id number]   Ms. Lamp underwent coronary artery grafting x4 on May 29, 2007. She  had a left internal mammary artery to the left anterior descending,  sequential saphenous graft to the ramus intermediate and obtuse  marginal, saphenous vein graft to posterior descending. The patient  tolerated this procedure well and her postoperative course was pretty  much unremarkable. She was discharged to home on postoperative day 5,  June 03, 2007.   The patient has followed up with Dr. Andee Lineman already. At the time of  follow up with her cardiologist, he started her back on Lasix daily due  to edema and a 7 pound weight gain. He also prescribed her 10 days worth  of pain medication.   The patient presents today for her follow up appointment. She states  that she is doing well. She still has slight discomfort in her sternal  incision, but she is taking pain medication.  She denies any shortness  of breath, cough, hemoptysis, palpitations, light headedness, or  dizziness. The patient is ambulating well. She is tolerating a regular  diet. She denies any drainage or opening from any of her incision sites.   PHYSICAL EXAMINATION:  VITAL SIGNS:  Blood pressure 128/78, pulse 64,  respirations 18, O2 saturations 94%.  RESPIRATORY:  Diminished breath sounds bilateral lower lobes.  CARDIAC:  Regular rate and rhythm with S1 and S2 noted.  ABDOMEN:  Bowel sounds x4. Soft and nontender.  EXTREMITIES:  Mild edema noted to the lower extremities. No cyanosis or  clubbing noted. All extremities are warm to touch.  INCISIONS:  All incisions are clean, dry, intact, and healing well.  There are no signs of cellulitis or drainage noted.   STUDIES:  The patient had a chest x-ray done today, June 21, 2007,  showing mild bilateral atelectasis. Otherwise stable. The  patient was to  have an echocardiogram on June 13, 2007. Report is currently not  available.   IMPRESSION:  Ms. Lapier is status post coronary artery bypass  grafting. She is doing well postoperatively. The patient was seen and  evaluated by Dr. Donata Clay.   PLAN:  Continue the patient on all medications. Dr. Donata Clay did  prescribe several more hydrocodone pain pills. The patient is still to  continue ambulating daily. She is told that she is allowed to bathe in a  bathtub and to continue washing her incisions with soap and water. She  is to contact us if she develops any drainage or opening from these  sites. The patient is released to drive. She currently has been told  that she can go back to work on September 11, 2007 if she remains stable.  The patient will be starting cardiac rehab in the next several weeks.  She is to follow up with Dr. Andee Lineman as he instructs. The patient will be  released from Dr. Zenaida Niece Trigt's office and will only need to follow up as  needed.   Kerin Perna, M.D.  Electronically Signed   KMD/MEDQ  D:  06/21/2007  T:  06/22/2007  Job:  [id number]   cc:   Learta Codding, MD,FACC

## 2011-04-11 NOTE — Consult Note (Signed)
[name], [name]           ACCOUNT NO.:  [id number]   MEDICAL RECORD NO.:  [id number]          PATIENT TYPE:  INP   LOCATION:  3733                         FACILITY:  [facility]   PHYSICIAN:  Kerin Perna, M.D.  DATE OF BIRTH:  [birth year]   DATE OF CONSULTATION:  05/22/2007  DATE OF DISCHARGE:                                 CONSULTATION   PRIMARY CARE PHYSICIAN:  Dr. __________ .   REASON FOR CONSULTATION:  Left main and coronary artery disease with  unstable angina.   CHIEF COMPLAINT:  Chest pain.   HISTORY OF PRESENT ILLNESS:  I was asked to evaluate this 65 year old  obese diabetic female for potential surgical coronary revascularization  for a recently diagnosed left main and multivessel coronary disease.  The patient has had symptoms of chest tightness over the left breast  during activity for over a month.  The pain usually resolves with rest.  She denies any nocturnal symptoms.  She was evaluated at [facility] where her cardiac enzymes were nondiagnostic, and she was  subsequently transferred to [facility] yesterday where she underwent  cardiac catheterization by Dr. Excell Seltzer.  This demonstrated a 60% distal  left main stenosis with an 80% ostial LAD stenosis and a 50% distal  stenosis of the right coronary with EF of 70%.  LVEDP of 10.  She was  felt to be a candidate for surgical coronary revascularization based on  her coronary anatomy and symptoms.   There is no history of MI.  Her EKG showed a sinus rhythm, and a 2-D  echo is pending.  Her Doppler shows her carotid arteries to be without  disease and ABIs are 1.0 in each lower extremity.   PAST MEDICAL HISTORY:  1. Diabetes mellitus.  2. History of smoking, quit 4 years ago.  3. Status post hysterectomy.  4. No known drug allergies.  5. Questionable history of left leg DVT during earlier pregnancy.   MEDICATIONS:  Xanax 0.5 q.h.s., Elavil 25 q.h.s. Crestor 10 mg daily,  HCTZ 12.5 a day, Amaryl 4  mg a day, Glucophage 850 t.i.d., Altace 2.5 mg  daily.   ALLERGIES:  None.   SOCIAL HISTORY:  She was a heavy smoker for several years but quit 4  years ago.  She does not use alcohol.  She is married.  She works at the  [employer].  She works 12-hour shifts 3 days a  week.   FAMILY HISTORY:  Positive for lung cancer, COPD and coronary disease.   REVIEW OF SYSTEMS:  CONSTITUTIONAL:  Review is negative fever, weight  loss. ENT:  Review is positive for total dental extraction with upper  and lower plates.  Negative for difficulty swallowing.  THORACIC:  Review is negative chest trauma or abnormal chest x-ray.  CARDIAC:  Review is positive for the findings noted above.  GI:  Review is  negative for blood per rectum, hepatitis or jaundice.  Her record  indicates she had a negative evaluation for gallstones.  ENDOCRINE:  Review is positive diabetes.  Negative for thyroid disease.  NEUROLOGIC:  Review is negative for  stroke or seizure.  VASCULAR:  Review is positive  for DVT history in the left leg, otherwise negative for claudication,  TIA.  BLEEDING:  Review is negative for blood transfusion or bleeding  disorder.   PHYSICAL EXAMINATION:  VITAL SIGNS:  The patient is 5 feet tall.  He is  205 pounds.  Blood pressure 130/80, pulse 80 regular.  GENERAL APPEARANCE:  That of a pleasant middle-aged female in hospital  room following cardiac cath, surrounded by family, in no distress.  HEENT:  Exam is normocephalic.  She is edentulous.  NECK:  Without JVD, mass or bruit.  Lymphatics reveal no palpable  supraclavicular adenopathy.  CHEST:  Without deformity and breath sounds are clear.  CARDIAC:  Exam reveals regular rhythm, no gallop or murmur.  ABDOMEN:  Abdominal exam was obese, soft without organomegaly.  EXTREMITIES:  Reveal positive pulses in all extremities.  No evidence of  venous insufficiency.  No edema, clubbing or cyanosis.  NEUROLOGIC:  Exam is  intact.   IMPRESSION AND PLAN:  The patient has a left main and ostial left  anterior descending (LAD) stenosis with preserved left ventricular (LV)  function.  Would benefit from bypass grafts to the left anterior  descending (LAD), diagonal, obtuse marginal (OM) and PDA.  We will  complete her preoperative evaluation and let the patient go home with a  leave of absence if it is satisfactory with the Cardiology Service and  then return for surgery on July 2.  Thank you for the consultation.      Kerin Perna, M.D.  Electronically Signed     PV/MEDQ  D:  05/22/2007  T:  05/23/2007  Job:  [id number]   cc:   Cecil Cranker, MD, [facility]  [facility] Office

## 2011-04-11 NOTE — Discharge Summary (Signed)
[name], Tanya Tanya           ACCOUNT NO.:  [id number]   MEDICAL RECORD NO.:  [id number]          PATIENT TYPE:  INP   LOCATION:  3733                         FACILITY:  [facility]   PHYSICIAN:  Veverly Fells. Excell Seltzer, MD  DATE OF BIRTH:  08-13-1946   DATE OF ADMISSION:  05/21/2007  DATE OF DISCHARGE:  05/23/2007                               DISCHARGE SUMMARY   PRIMARY CARDIOLOGIST:  Dr. Lewayne Bunting.   PRIMARY CARE Becki Mccaskill:  Dr. Olena Leatherwood in [place].   DISCHARGE DIAGNOSIS:  Unstable angina/coronary artery disease.   SECONDARY DIAGNOSES:  1. Hypertension.  2. Hyperlipidemia.  3. Type 2 diabetes mellitus.  4. Obesity.   ALLERGIES:  No known drug allergies.   PROCEDURE:  Left heart cardiac catheterization.   HISTORY OF PRESENT ILLNESS:  A 65 year old Caucasian female with prior  history of hypertension, hyperlipidemia and diabetes who was recently  admitted to [facility] following a one-month history of  exertional substernal chest discomfort, relieved with rest.  There she  was evaluated by [facility] cardiology and decision was made the transfer  to [facility] for further evaluation.   HOSPITAL COURSE:  Tanya Tanya underwent left heart cardiac  catheterization on June 24 showing a 60% distal left main stenosis as  well as an 80% osteo left anterior descending stenosis.  She otherwise  had nonobstructive coronary artery disease with an ejection fraction of  75%.  It was felt that she would best be managed with bypass surgery,  and was evaluated by Dr. Kathlee Nations Trigt who agreed.  Her surgery has  been scheduled for May 29, 2007.  She has not had any recurrent chest  discomfort and has been placed on aspirin beta blocker, statin, and ACE  inhibitor therapy.  She will be discharged home today with plans to  return on July 2 for coronary artery bypass grafting with Dr. Donata Clay.   DISCHARGE LABS:  Hemoglobin 13,8, hematocrit 40.7, WBC 8.3, platelets  195, MCV 86.8, sodium 132,  potassium 4.1, chloride 99, CO2 26, BUN 15,  creatinine 0.73, glucose 220, total cholesterol 150, triglycerides 230,  HDL 34, LDL 70, calcium 8.6.  Patient had PFTs on July 26 revealing an  FVC of 2.05 (74%) and an FEV1 of 1.43 (70%), an FEV1/FVC of 90%, an FEF  25-75.92 (37%).  These findings were consistent with moderate  obstructive airway disease.   DISPOSITION:  This patient is taking a leave of absence today in good  condition.  Follow up with an appointment.  She will re-present on July  2 for her bypass surgery.  We have a pre-arranged followup with Dr. Lewayne Bunting on July 22 at 1245 p.m.   DISCHARGE MEDICATIONS:  1. Aspirin 325 mg daily.  2. Ramipril 2.5 mg daily.  3. Toprol-XL 50 mg daily.  4. Crestor 10 at bedtime.  5. Nitroglycerin 0.4 mg sublingual p.r.n. chest pain.  6. Amaryl 4 mg daily.  7. Metformin 850 mg b.i.d.  8. Xanax 0.5 mg at bedtime.  9. __________ 25 mg at bedtime.   __________ .  None.   DURATION:  Discharge encountered 40 minutes  including physician time.      Nicolasa Ducking, ANP      Veverly Fells. Excell Seltzer, MD  Electronically Signed    CB/MEDQ  D:  05/23/2007  T:  05/23/2007  Job:  [id number]   cc:   Tanya Tanya, M.D.

## 2011-04-11 NOTE — Cardiovascular Report (Signed)
[name], [name]           ACCOUNT NO.:  [id number]   MEDICAL RECORD NO.:  [id number]          PATIENT TYPE:  INP   LOCATION:  3733                         FACILITY:  [facility]   PHYSICIAN:  Veverly Fells. Excell Seltzer, MD  DATE OF BIRTH:  Dec 27, 1945   DATE OF PROCEDURE:  05/21/2007  DATE OF DISCHARGE:                            CARDIAC CATHETERIZATION   PROCEDURE:  Left heart catheterization, selective coronary angiography,  left ventricular angiography, StarClose of the right femoral artery.   INDICATIONS:  [name] as a 65 year old woman who presented with  unstable angina.  She has multiple cardiac risk factors and was referred  for cardiac catheterization.  She has had chest pain over the last month  at rest and with exertion but her symptoms of been progressive.  Risks  and indications of procedure were explained to the patient.  Informed  consent was obtained.  The right groin was prepped, draped, anesthetized  1% lidocaine.  Using modified Seldinger technique, a 6-French sheath was  placed in the right femoral artery.  Multiple views of the left and  right coronary arteries were taken using standard 6-French Judkins  catheters.  Following selective coronary angiography, an angled pigtail  catheter was inserted into the left ventricle and pressures were  recorded.  The left ventriculogram was performed on pull-back across the  aortic valve was done.  At the conclusion of the procedure, a StarClose  device was used to seal the femoral arteriotomy.  All catheter exchanges  were performed over a guidewire.  There were no immediate complications.   FINDINGS:  Aortic pressure 115/52 with a mean of 76.  Left ventricular  pressure 122/10.   CORONARY ANGIOGRAPHY:  Left main is angiographically normal in its  proximal and mid portion.  It tapers distally and there is a 50-60%  stenosis in the distal left main.   The LAD is a large-caliber vessel in its proximal portion.  The LAD  only  extends down to the mid anterior wall.  There is a brick.  There is a  diagonal branch in the territory of a second diagonal that is the  dominant vessel.  There are two septal perforators.  The ostium of the  LAD has an 80% stenosis involving the true ostium.  It appears very  focal and is difficult to lay out and to lay out by angiography, but  there is clearly a high-grade stenosis there.   The ramus intermedius has 40% stenosis in the mid portion.  It is a  medium caliber vessel.  The left circumflex has no significant  angiographic disease.  It provides a second OM branch site that supplies  a second and third OM branch.  The AV groove circumflex is small.   The right coronary artery is diffusely diseased.  Proximal portion of  the vessel has luminal irregularities mid portion has a 40 at the site  30-40% stenosis.  The vessel branches into a PDA and also provides two  PL branches.  The PDA has a 50% stenosis in its mid portion.  The first  PL branch also has a 50% stenosis.  Left ventricular function assessed by 30-degree RAO left  ventriculography is hyperdynamic.  There are no regional wall motion  abnormalities.  The LVEF is 75%.   ASSESSMENT:  1. Moderate distal left main stenosis.  2. Severe ostial left anterior descending stenosis.  3. Nonobstructive right coronary artery stenosis.  4. Normal to hyperdynamic left ventricular function.   [name] has disease involving the distal left main and ostial left  anterior descending.  I think she requires surgical consultation for  coronary bypass.  If she has any resting symptoms, we will restart her  heparin.      Veverly Fells. Excell Seltzer, MD  Electronically Signed     MDC/MEDQ  D:  05/21/2007  T:  05/22/2007  Job:  [id number]

## 2011-09-12 LAB — CBC
HCT: 30.9 — ABNORMAL LOW
HCT: 31.7 — ABNORMAL LOW
HCT: 31.7 — ABNORMAL LOW
HCT: 31.7 — ABNORMAL LOW
HCT: 32.5 — ABNORMAL LOW
HCT: 32.8 — ABNORMAL LOW
HCT: 33.1 — ABNORMAL LOW
Hemoglobin: 10.3 — ABNORMAL LOW
Hemoglobin: 10.7 — ABNORMAL LOW
Hemoglobin: 10.8 — ABNORMAL LOW
Hemoglobin: 10.8 — ABNORMAL LOW
Hemoglobin: 11 — ABNORMAL LOW
Hemoglobin: 11 — ABNORMAL LOW
Hemoglobin: 11.1 — ABNORMAL LOW
MCHC: 33.3
MCHC: 33.4
MCHC: 33.6
MCHC: 33.8
MCHC: 33.9
MCHC: 33.9
MCHC: 34
MCV: 87.4
MCV: 87.4
MCV: 87.5
MCV: 87.6
MCV: 88
MCV: 88.2
MCV: 88.4
Platelets: 145 — ABNORMAL LOW
Platelets: 146 — ABNORMAL LOW
Platelets: 160
Platelets: 172
Platelets: 175
Platelets: 182
Platelets: 222
RBC: 3.53 — ABNORMAL LOW
RBC: 3.59 — ABNORMAL LOW
RBC: 3.63 — ABNORMAL LOW
RBC: 3.63 — ABNORMAL LOW
RBC: 3.69 — ABNORMAL LOW
RBC: 3.71 — ABNORMAL LOW
RBC: 3.78 — ABNORMAL LOW
RDW: 12.8
RDW: 12.9
RDW: 13
RDW: 13.2
RDW: 13.2
RDW: 13.3
RDW: 13.6
WBC: 10.6 — ABNORMAL HIGH
WBC: 12.6 — ABNORMAL HIGH
WBC: 12.8 — ABNORMAL HIGH
WBC: 14 — ABNORMAL HIGH
WBC: 14.1 — ABNORMAL HIGH
WBC: 15.3 — ABNORMAL HIGH
WBC: 17.5 — ABNORMAL HIGH

## 2011-09-12 LAB — POCT I-STAT 3, ART BLOOD GAS (G3+)
Acid-Base Excess: 1
Acid-base deficit: 1
Acid-base deficit: 1
Acid-base deficit: 4 — ABNORMAL HIGH
Acid-base deficit: 4 — ABNORMAL HIGH
Bicarbonate: 21.3
Bicarbonate: 21.6
Bicarbonate: 23.5
Bicarbonate: 24.1 — ABNORMAL HIGH
Bicarbonate: 25 — ABNORMAL HIGH
Bicarbonate: 25.6 — ABNORMAL HIGH
O2 Saturation: 100
O2 Saturation: 100
O2 Saturation: 100
O2 Saturation: 95
O2 Saturation: 97
O2 Saturation: 97
Operator id: 199901
Operator id: 199901
Operator id: 203371
Operator id: 3291
Operator id: 3291
Operator id: 3291
Patient temperature: 35.1
Patient temperature: 36
Patient temperature: 37.3
TCO2: 22
TCO2: 23
TCO2: 25
TCO2: 25
TCO2: 26
TCO2: 27
pCO2 arterial: 34.7 — ABNORMAL LOW
pCO2 arterial: 35.4
pCO2 arterial: 36.6
pCO2 arterial: 41.1
pCO2 arterial: 43.2
pCO2 arterial: 44.9
pH, Arterial: 7.329 — ABNORMAL LOW
pH, Arterial: 7.355
pH, Arterial: 7.36
pH, Arterial: 7.386
pH, Arterial: 7.429 — ABNORMAL HIGH
pH, Arterial: 7.443 — ABNORMAL HIGH
pO2, Arterial: 252 — ABNORMAL HIGH
pO2, Arterial: 320 — ABNORMAL HIGH
pO2, Arterial: 336 — ABNORMAL HIGH
pO2, Arterial: 73 — ABNORMAL LOW
pO2, Arterial: 82
pO2, Arterial: 94

## 2011-09-12 LAB — PROTIME-INR
INR: 1.5
Prothrombin Time: 18.5 — ABNORMAL HIGH

## 2011-09-12 LAB — BASIC METABOLIC PANEL
BUN: 11
BUN: 5 — ABNORMAL LOW
BUN: 6
BUN: 8
CO2: 25
CO2: 31
CO2: 31
CO2: 32
Calcium: 8.2 — ABNORMAL LOW
Calcium: 8.3 — ABNORMAL LOW
Calcium: 8.7
Calcium: 8.9
Chloride: 102
Chloride: 103
Chloride: 110
Chloride: 96
Creatinine, Ser: 0.63
Creatinine, Ser: 0.65
Creatinine, Ser: 0.76
Creatinine, Ser: 0.78
GFR calc Af Amer: >60
GFR calc Af Amer: >60
GFR calc Af Amer: >60
GFR calc Af Amer: >60
GFR calc non Af Amer: >60
GFR calc non Af Amer: >60
GFR calc non Af Amer: >60
GFR calc non Af Amer: >60
Glucose, Bld: 128 — ABNORMAL HIGH
Glucose, Bld: 64 — ABNORMAL LOW
Glucose, Bld: 85
Glucose, Bld: 90
Potassium: 3.5
Potassium: 3.6
Potassium: 3.7
Potassium: 4.2
Sodium: 135
Sodium: 139
Sodium: 139
Sodium: 139

## 2011-09-12 LAB — POCT I-STAT 4, (NA,K, GLUC, HGB,HCT)
Glucose, Bld: 106 — ABNORMAL HIGH
Glucose, Bld: 106 — ABNORMAL HIGH
Glucose, Bld: 110 — ABNORMAL HIGH
Glucose, Bld: 131 — ABNORMAL HIGH
Glucose, Bld: 147 — ABNORMAL HIGH
Glucose, Bld: 178 — ABNORMAL HIGH
Glucose, Bld: 92
Glucose, Bld: 95
HCT: 20 — ABNORMAL LOW
HCT: 21 — ABNORMAL LOW
HCT: 22 — ABNORMAL LOW
HCT: 24 — ABNORMAL LOW
HCT: 25 — ABNORMAL LOW
HCT: 28 — ABNORMAL LOW
HCT: 35 — ABNORMAL LOW
HCT: 39
Hemoglobin: 11.9 — ABNORMAL LOW
Hemoglobin: 13.3
Hemoglobin: 6.8 — CL
Hemoglobin: 7.1 — CL
Hemoglobin: 7.5 — CL
Hemoglobin: 8.2 — ABNORMAL LOW
Hemoglobin: 8.5 — ABNORMAL LOW
Hemoglobin: 9.5 — ABNORMAL LOW
Operator id: 203371
Operator id: 3291
Operator id: 3291
Operator id: 3291
Operator id: 3291
Operator id: 3291
Operator id: 3291
Operator id: 3291
Potassium: 3.5
Potassium: 3.5
Potassium: 3.6
Potassium: 3.8
Potassium: 3.9
Potassium: 4.3
Potassium: 4.4
Potassium: 4.4
Sodium: 134 — ABNORMAL LOW
Sodium: 137
Sodium: 137
Sodium: 137
Sodium: 138
Sodium: 138
Sodium: 139
Sodium: 140

## 2011-09-12 LAB — CARDIAC PANEL(CRET KIN+CKTOT+MB+TROPI)
CK, MB: 118.4 — ABNORMAL HIGH
Relative Index: 14.1 — ABNORMAL HIGH
Total CK: 839 — ABNORMAL HIGH
Troponin I: 7.76

## 2011-09-12 LAB — CREATININE, SERUM
Creatinine, Ser: 0.6
Creatinine, Ser: 0.76
GFR calc Af Amer: >60
GFR calc Af Amer: >60
GFR calc non Af Amer: >60
GFR calc non Af Amer: >60

## 2011-09-12 LAB — PLATELET COUNT: Platelets: 153

## 2011-09-12 LAB — I-STAT 8, (EC8 V) (CONVERTED LAB)
Acid-Base Excess: 1
BUN: 6
Bicarbonate: 26.4 — ABNORMAL HIGH
Chloride: 99
Glucose, Bld: 180 — ABNORMAL HIGH
HCT: 33 — ABNORMAL LOW
Hemoglobin: 11.2 — ABNORMAL LOW
Operator id: 274841
Potassium: 4
Sodium: 136
TCO2: 28
pCO2, Ven: 46
pH, Ven: 7.367 — ABNORMAL HIGH

## 2011-09-12 LAB — I-STAT EC8
Acid-base deficit: 4 — ABNORMAL HIGH
BUN: 7
Bicarbonate: 21.9
Chloride: 108
Glucose, Bld: 119 — ABNORMAL HIGH
HCT: 30 — ABNORMAL LOW
Hemoglobin: 10.2 — ABNORMAL LOW
Operator id: 199901
Potassium: 4.4
Sodium: 143
TCO2: 23
pCO2 arterial: 41.3
pH, Arterial: 7.332 — ABNORMAL LOW

## 2011-09-12 LAB — POCT I-STAT 3, VENOUS BLOOD GAS (G3P V)
Acid-base deficit: 2
Bicarbonate: 22.5
O2 Saturation: 83
Operator id: 3291
TCO2: 24
pCO2, Ven: 37.7 — ABNORMAL LOW
pH, Ven: 7.383 — ABNORMAL HIGH
pO2, Ven: 48 — ABNORMAL HIGH

## 2011-09-12 LAB — MAGNESIUM
Magnesium: 2.3
Magnesium: 2.7 — ABNORMAL HIGH
Magnesium: 3 — ABNORMAL HIGH

## 2011-09-12 LAB — HEMOGLOBIN AND HEMATOCRIT, BLOOD
HCT: 23 — ABNORMAL LOW
Hemoglobin: 8 — ABNORMAL LOW

## 2011-09-12 LAB — POCT I-STAT GLUCOSE
Glucose, Bld: 156 — ABNORMAL HIGH
Operator id: 114421

## 2011-09-12 LAB — APTT: aPTT: 30

## 2011-09-13 LAB — URINE MICROSCOPIC-ADD ON

## 2011-09-13 LAB — URINALYSIS, ROUTINE W REFLEX MICROSCOPIC
Bilirubin Urine: NEGATIVE
Glucose, UA: NEGATIVE
Hgb urine dipstick: NEGATIVE
Ketones, ur: NEGATIVE
Nitrite: POSITIVE — AB
Protein, ur: NEGATIVE
Specific Gravity, Urine: <1.005 — ABNORMAL LOW
Urobilinogen, UA: 0.2
pH: 6.5

## 2011-09-13 LAB — BLOOD GAS, ARTERIAL
Acid-Base Excess: 0.9
Bicarbonate: 25.2 — ABNORMAL HIGH
Drawn by: 206361
FIO2: 0.21
O2 Saturation: 96.7
Patient temperature: 98.6
TCO2: 26.5
pCO2 arterial: 42
pH, Arterial: 7.396
pO2, Arterial: 82.2

## 2011-09-13 LAB — TYPE AND SCREEN
ABO/RH(D): O POS
Antibody Screen: NEGATIVE

## 2011-09-13 LAB — CBC
HCT: 40.7
HCT: 42.9
Hemoglobin: 14.5
MCHC: 33.8
MCV: 86.8
MCV: 86.8
Platelets: 195
Platelets: 209
RBC: 4.94
RDW: 12.5
RDW: 12.9
WBC: 8.3
WBC: 8.9

## 2011-09-13 LAB — COMPREHENSIVE METABOLIC PANEL
ALT: 26
AST: 11
Albumin: 3.8
Alkaline Phosphatase: 103
BUN: 10
CO2: 23
Calcium: 9.4
Chloride: 105
Creatinine, Ser: 0.63
GFR calc Af Amer: >60
GFR calc non Af Amer: >60
Glucose, Bld: 180 — ABNORMAL HIGH
Potassium: 4.6
Sodium: 135
Total Bilirubin: 0.6
Total Protein: 6.6

## 2011-09-13 LAB — BASIC METABOLIC PANEL
BUN: 15
Chloride: 99
Creatinine, Ser: 0.73
GFR calc non Af Amer: >60
Glucose, Bld: 220 — ABNORMAL HIGH
Potassium: 4.1

## 2011-09-13 LAB — PROTIME-INR
INR: 1
Prothrombin Time: 13.4

## 2011-09-13 LAB — HEMOGLOBIN A1C: Hgb A1c MFr Bld: 9.3 — ABNORMAL HIGH

## 2011-09-13 LAB — LIPID PANEL
Cholesterol: 150
HDL: 34 — ABNORMAL LOW

## 2011-09-13 LAB — APTT: aPTT: 25

## 2011-09-13 LAB — ABO/RH: ABO/RH(D): O POS

## 2011-10-06 ENCOUNTER — Encounter: Payer: Self-pay | Admitting: Cardiology

## 2011-10-06 ENCOUNTER — Ambulatory Visit (INDEPENDENT_AMBULATORY_CARE_PROVIDER_SITE_OTHER): Payer: BC Managed Care – PPO | Admitting: Cardiology

## 2011-10-06 VITALS — BP 163/94 | HR 65 | Ht 60.0 in | Wt 198.0 lb

## 2011-10-06 DIAGNOSIS — Z951 Presence of aortocoronary bypass graft: Secondary | ICD-10-CM

## 2011-10-06 DIAGNOSIS — R609 Edema, unspecified: Secondary | ICD-10-CM

## 2011-10-06 DIAGNOSIS — I10 Essential (primary) hypertension: Secondary | ICD-10-CM

## 2011-10-06 MED ORDER — ACETAMINOPHEN 500 MG PO TABS: 1000.0000 mg | ORAL_TABLET | Freq: Four times a day (QID) | ORAL | Status: AC | PRN

## 2011-10-06 NOTE — Patient Instructions (Signed)
   Hold Ibuprofen and Mobic (as much as possible)  Tylenol ES 500mg  - may take 2 tabs every 6 hours as needed  Your physician wants you to follow up in: 6 months.  You will receive a reminder letter in the mail one-two months in advance.  If you don't receive a letter, please call our office to schedule the follow up appointment

## 2011-10-06 NOTE — Progress Notes (Signed)
CC: Routine followup coronary artery disease  HPI:  The patient is a 65 year old female with history of coronary arteries, status post coronary bypass grafting, diabetes mellitus and hypertension. The patient reports no chest pain or shortness of breath. She has no exertional symptoms. She does have some chest pressure only when lying down but without exertional component. She also refer ports reflux symptoms and stomach pain. She denies any melena hematochezia. She reports no orthopnea or PND.   PMH: reviewed and listed in Problem List in Electronic Records (and see below)  Allergies/SH/FHX : available in Electronic Records for review  Medications: Current Outpatient Prescriptions  Medication Sig Dispense Refill  . ALPRAZolam (XANAX) 0.5 MG tablet Take 0.5 mg by mouth at bedtime as needed.        Marland Kitchen amLODipine (NORVASC) 5 MG tablet Take 5 mg by mouth daily.        Marland Kitchen aspirin 81 MG tablet Take 81 mg by mouth daily.        Marland Kitchen glimepiride (AMARYL) 4 MG tablet Take 4 mg by mouth daily before breakfast.        . insulin glargine (LANTUS) 100 UNIT/ML injection Inject into the skin at bedtime.        Marland Kitchen lisinopril-hydrochlorothiazide (PRINZIDE,ZESTORETIC) 20-25 MG per tablet Take 1 tablet by mouth daily.        . metFORMIN (GLUCOPHAGE) 500 MG tablet Take 500 mg by mouth daily.        . Multiple Vitamins-Minerals (OCUVITE PRESERVISION PO) Take 1 tablet by mouth daily.        . nitroGLYCERIN (NITROSTAT) 0.4 MG SL tablet Place 0.4 mg under the tongue every 5 (five) minutes as needed.        . Omega-3 Fatty Acids (FISH OIL) 1000 MG CAPS Take 1 capsule by mouth 2 (two) times daily.       . ranitidine (ZANTAC) 150 MG tablet Take 150 mg by mouth 2 (two) times daily.        . simvastatin (ZOCOR) 40 MG tablet Take 40 mg by mouth at bedtime.        . traMADol (ULTRAM) 50 MG tablet Take 1 tablet by mouth Three times daily as needed.      . VESICARE 5 MG tablet Take 1 tablet by mouth Daily.      Marland Kitchen  acetaminophen (TYLENOL) 500 MG tablet Take 2 tablets (1,000 mg total) by mouth every 6 (six) hours as needed for pain.        ROS: No nausea or vomiting. No fever or chills.No melena or hematochezia.No bleeding.No claudication  Physical Exam: BP 163/94  Pulse 65  Ht 5' (1.524 m)  Wt 198 lb (89.812 kg)  BMI 38.67 kg/m2 General: Well-nourished white female no apparent distress. Neck: Normal carotid upstroke no carotid bruits. No thyromegaly nonnodular thyroid. JVP is 5 cm Lungs: Clear breath sounds bilaterally without wheezing Cardiac: Regular rate and rhythm with normal S1-S2 no murmur rubs or gallops Vascular: Trace  edema. Normal peripheral pulses bilaterally Skin: Warm and dry  12lead ECG: Limited bedside ECHO:N/A   Assessment and Plan

## 2011-10-06 NOTE — Assessment & Plan Note (Signed)
Possibly salt and water retention from nonsteroidals. Status post lower extremity Dopplers were negative for DVT

## 2011-10-06 NOTE — Assessment & Plan Note (Signed)
No recurrent chest pain. Patient awoke from a cardiac standpoint. Continue risk factor modification and asked to followup with her primary care physician regarding her lipid panel and additional bloodwork.

## 2011-10-06 NOTE — Assessment & Plan Note (Signed)
Blood pressure is poorly controlled and the patient is a combination of ibuprofen and meloxicam. I asked her to switch to high-dose Tylenol and used tramadol when necessary as needed. She also has some GI upset from her ibuprofen in addition I think this is driving her hypertension. The patient will get a blood pressure cuff in followup her blood pressure carefully.

## 2012-04-29 ENCOUNTER — Encounter: Payer: Self-pay | Admitting: Cardiology

## 2012-04-29 ENCOUNTER — Ambulatory Visit (INDEPENDENT_AMBULATORY_CARE_PROVIDER_SITE_OTHER): Payer: BC Managed Care – PPO | Admitting: Cardiology

## 2012-04-29 VITALS — BP 125/76 | HR 66 | Ht 60.0 in | Wt 196.0 lb

## 2012-04-29 DIAGNOSIS — E7800 Pure hypercholesterolemia, unspecified: Secondary | ICD-10-CM

## 2012-04-29 DIAGNOSIS — R079 Chest pain, unspecified: Secondary | ICD-10-CM

## 2012-04-29 DIAGNOSIS — Z951 Presence of aortocoronary bypass graft: Secondary | ICD-10-CM

## 2012-04-29 DIAGNOSIS — I2510 Atherosclerotic heart disease of native coronary artery without angina pectoris: Secondary | ICD-10-CM

## 2012-04-29 MED ORDER — ATORVASTATIN CALCIUM 80 MG PO TABS: 80.0000 mg | ORAL_TABLET | Freq: Every evening | ORAL | Status: DC

## 2012-04-29 NOTE — Progress Notes (Signed)
Tanya Bottoms, MD, [facility] ABIM Board Certified in Adult Cardiovascular Medicine,Internal Medicine and Critical Care Medicine    CC: Followup patient history of ischemic heart disease.  HPI:  The patient is doing well post coronary bypass grafting. She denies any angina or shortness of breath. She has good exercise tolerance. Unfortunately she tripped recently and fell on the right side. X-rays demonstrated no evidence of fracture but the patient is taking tramadol for pain. From a cardiac standpoint is otherwise stable. She denies any palpitations, presyncope or syncope.  PMH: reviewed and listed in Problem List in Electronic Records (and see below) Past Medical History  Diagnosis Date  . Other specified cardiac dysrhythmias   . Other and unspecified hyperlipidemia   . Unspecified essential hypertension   . 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 Type II or unspecified type diabetes mellitus without mention of complication, not stated as uncontrolled   . GERD (gastroesophageal reflux disease)   . Unspecified essential hypertension   . Depression   . Postinflammatory pulmonary fibrosis   . Pure hypercholesterolemia   . Other specified cardiac dysrhythmias   . Postsurgical aortocoronary bypass status    Past Surgical History  Procedure Date  . Coronary artery bypass graft   05/29/2007     Allergies/SH/FHX : available in Electronic Records for review  No Known Allergies History   Social History  . Marital Status: Divorced    Spouse Name: N/A    Number of Children: N/A  . Years of Education: N/A   Occupational History  . RETIRED    Social History Main Topics  . Smoking status: Former Smoker -- 1.0 packs/day for 34 years    Types: Cigarettes    Quit date: 11/27/2001  . Smokeless tobacco: Never Used  . Alcohol Use: No  . Drug Use: No  . Sexually Active: Not on file   Other Topics Concern  . Not on file   Social History Narrative  . No  narrative on file   Family History  Problem Relation Age of Onset  . Cancer Other     Family Hx of Cancer  . Coronary artery disease Other     Family Hx of CAD    Medications: Current Outpatient Prescriptions  Medication Sig Dispense Refill  . acetaminophen (TYLENOL) 500 MG tablet Take 2 tablets (1,000 mg total) by mouth every 6 (six) hours as needed for pain.      Marland Kitchen ALPRAZolam (XANAX) 0.5 MG tablet Take 0.5 mg by mouth at bedtime as needed.        Marland Kitchen amLODipine (NORVASC) 5 MG tablet Take 5 mg by mouth daily.        Marland Kitchen aspirin 81 MG tablet Take 81 mg by mouth daily.        Marland Kitchen atorvastatin (LIPITOR) 80 MG tablet Take 1 tablet (80 mg total) by mouth every evening.  30 tablet  6  . glimepiride (AMARYL) 4 MG tablet Take 4 mg by mouth daily before breakfast.        . insulin glargine (LANTUS) 100 UNIT/ML injection Inject into the skin at bedtime.        Marland Kitchen lisinopril-hydrochlorothiazide (PRINZIDE,ZESTORETIC) 20-25 MG per tablet Take 1 tablet by mouth daily.        . metFORMIN (GLUCOPHAGE) 1000 MG tablet Take 1,000 mg by mouth 2 (two) times daily with a meal.      . Multiple Vitamins-Minerals (OCUVITE PRESERVISION PO) Take 1 tablet by  mouth daily.        . nitroGLYCERIN (NITROSTAT) 0.4 MG SL tablet Place 0.4 mg under the tongue every 5 (five) minutes as needed.        . Omega-3 Fatty Acids (FISH OIL) 1000 MG CAPS Take 1 capsule by mouth 2 (two) times daily.       . ranitidine (ZANTAC) 150 MG tablet Take 150 mg by mouth 2 (two) times daily.        . traMADol (ULTRAM) 50 MG tablet Take 1 tablet by mouth Three times daily as needed.      . VESICARE 5 MG tablet Take 1 tablet by mouth Daily.      Marland Kitchen DISCONTD: atorvastatin (LIPITOR) 40 MG tablet Take 1 tablet by mouth At bedtime.        ROS: No nausea or vomiting. No fever or chills.No melena or hematochezia.No bleeding.No claudication  Physical Exam: BP 125/76  Pulse 66  Ht 5' (1.524 m)  Wt 196 lb (88.905 kg)  BMI 38.28 kg/m2 General:  Well-nourished white female in no distress Neck: No gross stroke and no carotid bruits no thyromegaly nonnodular thyroid Lungs: Clear breath sounds bilaterally without wheezing Cardiac: Regular rate and rhythm with normal S1-S2 no murmur rubs or gallops Vascular: No edema. Normal distal pulses Skin: Warm and dry Physcologic: Normal affect  12lead ECG: Normal sinus rhythm no acute ischemic changes Limited bedside ECHO:N/A No images are attached to the encounter.   Assessment and Plan  CHEST PAIN, EXERTIONAL No recurrent chest pain patient is stable  CORONARY ARTERY BYPASS GRAFT, HX OF Patient has been evaluated with ischemia testing in 2012. No recurrent chest pain continue medical therapy.  PURE HYPERCHOLESTEROLEMIA Followed by the patient's primary care physician but she was inadvertently placed on both Zocor and Lipitor and the patient was not aware of this. I pointed out the problem to her. Because of the fact that she takes amlodipine I stopped her Zocor and increase her Lipitor to 80 mg by mouth daily.    Patient Active Problem List  Diagnoses  . HYPERTHYROIDISM (GRAVE'S)  . PURE HYPERCHOLESTEROLEMIA  . HYPERLIPIDEMIA-MIXED  . HYPERTENSION, UNSPECIFIED  . INTERSTITIAL LUNG DISEASE  . EDEMA LEG  . CHEST PAIN, EXERTIONAL  . CORONARY ARTERY BYPASS GRAFT, HX OF  . GERD (gastroesophageal reflux disease)

## 2012-04-29 NOTE — Assessment & Plan Note (Signed)
Followed by the patient's primary care physician but she was inadvertently placed on both Zocor and Lipitor and the patient was not aware of this. I pointed out the problem to her. Because of the fact that she takes amlodipine I stopped her Zocor and increase her Lipitor to 80 mg by mouth daily.

## 2012-04-29 NOTE — Patient Instructions (Signed)
   Stop Zocor   Change to Lipitor 80mg  every evening Your physician wants you to follow up in:  1 year.  You will receive a reminder letter in the mail one-two months in advance.  If you don't receive a letter, please call our office to schedule the follow up appointment

## 2012-04-29 NOTE — Assessment & Plan Note (Signed)
Patient has been evaluated with ischemia testing in 2012. No recurrent chest pain continue medical therapy.

## 2012-04-29 NOTE — Assessment & Plan Note (Signed)
No recurrent chest pain patient is stable

## 2013-09-06 ENCOUNTER — Other Ambulatory Visit: Payer: Self-pay | Admitting: Cardiology

## 2014-02-16 ENCOUNTER — Telehealth: Payer: Self-pay | Admitting: Cardiology

## 2014-02-16 NOTE — Telephone Encounter (Signed)
Received fax refill request  Rx # [id number] Medication:  Lipitor 80 mg tab Qty 30 Sig:  Take one tablet by mouth in the evening (need office visit) Physician:  Wyline Mood

## 2014-02-16 NOTE — Telephone Encounter (Signed)
Denied.  Discussed with patient.  Last seen 04/2012.  Hasanaj typically does her follow up labs for cholesterol medication.  She will contact his office for the refill request below.  OV scheduled for 03/03/2014 with Dr. Wyline Mood.

## 2014-03-03 ENCOUNTER — Encounter: Payer: Self-pay | Admitting: Cardiology

## 2014-03-03 ENCOUNTER — Ambulatory Visit (INDEPENDENT_AMBULATORY_CARE_PROVIDER_SITE_OTHER): Payer: BC Managed Care – PPO | Admitting: Cardiology

## 2014-03-03 VITALS — BP 150/77 | HR 66 | Ht 60.0 in | Wt 190.8 lb

## 2014-03-03 DIAGNOSIS — E785 Hyperlipidemia, unspecified: Secondary | ICD-10-CM

## 2014-03-03 DIAGNOSIS — I10 Essential (primary) hypertension: Secondary | ICD-10-CM

## 2014-03-03 DIAGNOSIS — I2510 Atherosclerotic heart disease of native coronary artery without angina pectoris: Secondary | ICD-10-CM

## 2014-03-03 MED ORDER — AMLODIPINE BESYLATE 10 MG PO TABS
10.0000 mg | ORAL_TABLET | Freq: Every day | ORAL | Status: DC
Start: 2014-03-03 — End: 2016-09-21

## 2014-03-03 NOTE — Patient Instructions (Signed)
Your physician recommends that you schedule a follow-up appointment in: 4 months with Dr. Wyline Mood. You should receive a letter in the mail in 2 months. If you do not receive this letter by June 2015 call our office to schedule this appointment.   Your physician has recommended you make the following change in your medication:  Increase: Amlodipine (Norvasc) 10 MG take 1 tablet by mouth once daily  Continue all other medications the same.   Your physician has requested you monitor and record your blood pressure and take readings with you to your visit with Dr. Olena Leatherwood.  Your physician has recommended you try the DASH diet. You have been given information today about this diet.

## 2014-03-03 NOTE — Progress Notes (Signed)
Clinical Summary [name] is a 68 y.o.female last seen by Dr [name], this is our first visit together. She is seen for the following medical problems.  1. CAD - prior CABG 2008 4 vessel (LIMA-LAD, SVG to ramus, SVG to OM, SVG to PDA) in the setting of progressing angina. LVEF was normal at that time by cath LV gram.  - denies any significant chest pain. Reports some occasional "twinges" , mild tightness in chest. Lasts just a few minutes. Can occur with rest or exertion. No other associated symptoms. Occurs approx 2-3 times a week. Stable frequency, stable severity.  - mixed compliance with meds, often difficult to remember given her work schedule. - stable exercise tolerance  2. Hyperlipidemia - compliant with lipitor - no recent panel in ours system  3. HTN - does not check regularly - mixed compliance with meds   Past Medical History  Diagnosis Date  . Other specified cardiac dysrhythmias   . Other and unspecified hyperlipidemia   . Unspecified essential hypertension   . 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 Type II or unspecified type diabetes mellitus without mention of complication, not stated as uncontrolled   . GERD (gastroesophageal reflux disease)   . Unspecified essential hypertension   . Depression   . Postinflammatory pulmonary fibrosis   . Pure hypercholesterolemia   . Other specified cardiac dysrhythmias   . Postsurgical aortocoronary bypass status      No Known Allergies   Current Outpatient Prescriptions  Medication Sig Dispense Refill  . ALPRAZolam (XANAX) 0.5 MG tablet Take 0.5 mg by mouth at bedtime as needed.        Marland Kitchen. amLODipine (NORVASC) 5 MG tablet Take 5 mg by mouth daily.        Marland Kitchen. aspirin 81 MG tablet Take 81 mg by mouth daily.        Marland Kitchen. atorvastatin (LIPITOR) 80 MG tablet TAKE ONE TABLET BY MOUTH IN THE EVENING  30 tablet  0  . glimepiride (AMARYL) 4 MG tablet Take 4 mg by mouth daily before breakfast.         . insulin glargine (LANTUS) 100 UNIT/ML injection Inject into the skin at bedtime.        Marland Kitchen. lisinopril-hydrochlorothiazide (PRINZIDE,ZESTORETIC) 20-25 MG per tablet Take 1 tablet by mouth daily.        . metFORMIN (GLUCOPHAGE) 1000 MG tablet Take 1,000 mg by mouth 2 (two) times daily with a meal.      . Multiple Vitamins-Minerals (OCUVITE PRESERVISION PO) Take 1 tablet by mouth daily.        . nitroGLYCERIN (NITROSTAT) 0.4 MG SL tablet Place 0.4 mg under the tongue every 5 (five) minutes as needed.        . Omega-3 Fatty Acids (FISH OIL) 1000 MG CAPS Take 1 capsule by mouth 2 (two) times daily.       . ranitidine (ZANTAC) 150 MG tablet Take 150 mg by mouth 2 (two) times daily.        . traMADol (ULTRAM) 50 MG tablet Take 1 tablet by mouth Three times daily as needed.      . VESICARE 5 MG tablet Take 1 tablet by mouth Daily.       No current facility-administered medications for this visit.     Past Surgical History  Procedure Laterality Date  . Coronary artery bypass graft    05/29/2007      No Known Allergies  Family History  Problem Relation Age of Onset  . Cancer Other     Family Hx of Cancer  . Coronary artery disease Other     Family Hx of CAD     Social History [name] reports that she quit smoking about 12 years ago. Her smoking use included Cigarettes. She has a 34 pack-year smoking history. She has never used smokeless tobacco. [name] reports that she does not drink alcohol.   Review of Systems CONSTITUTIONAL: No weight loss, fever, chills, weakness or fatigue.  HEENT: Eyes: No visual loss, blurred vision, double vision or yellow sclerae.No hearing loss, sneezing, congestion, runny nose or sore throat.  SKIN: No rash or itching.  CARDIOVASCULAR: per HPI RESPIRATORY: No shortness of breath, cough or sputum.  GASTROINTESTINAL: No anorexia, nausea, vomiting or diarrhea. No abdominal pain or blood.  GENITOURINARY: No burning on urination, no  polyuria NEUROLOGICAL: No headache, dizziness, syncope, paralysis, ataxia, numbness or tingling in the extremities. No change in bowel or bladder control.  MUSCULOSKELETAL: No muscle, back pain, joint pain or stiffness.  LYMPHATICS: No enlarged nodes. No history of splenectomy.  PSYCHIATRIC: No history of depression or anxiety.  ENDOCRINOLOGIC: No reports of sweating, cold or heat intolerance. No polyuria or polydipsia.  Marland Kitchen   Physical Examination p 66 bp 150/70 Wt 190 lbs BMI 37 Gen: resting comfortably, no acute distress HEENT: no scleral icterus, pupils equal round and reactive, no palptable cervical adenopathy,  CV: RRR, no mr/g, no jVD, no carotid bruits Resp: Clear to auscultation bilaterally GI: abdomen is soft, non-tender, non-distended, normal bowel sounds, no hepatosplenomegaly MSK: extremities are warm, no edema.  Skin: warm, no rash Neuro:  no focal deficits Psych: appropriate affect   Diagnostic Studies Cath 04/2007 FINDINGS: Aortic pressure 115/52 with a mean of 76. Left ventricular  pressure 122/10.  CORONARY ANGIOGRAPHY: Left main is angiographically normal in its  proximal and mid portion. It tapers distally and there is a 50-60%  stenosis in the distal left main.  The LAD is a large-caliber vessel in its proximal portion. The LAD only  extends down to the mid anterior wall. There is a brick. There is a  diagonal [name] in the territory of a second diagonal that is the  dominant vessel. There are two septal perforators. The ostium of the  LAD has an 80% stenosis involving the true ostium. It appears very  focal and is difficult to lay out and to lay out by angiography, but  there is clearly a high-grade stenosis there.  The ramus intermedius has 40% stenosis in the mid portion. It is a  medium caliber vessel. The left circumflex has no significant  angiographic disease. It provides a second OM [name] site that supplies  a second and third OM [name]. The AV groove  circumflex is small.  The right coronary artery is diffusely diseased. Proximal portion of  the vessel has luminal irregularities mid portion has a 40 at the site  30-40% stenosis. The vessel branches into a PDA and also provides two  PL branches. The PDA has a 50% stenosis in its mid portion. The first  PL Deriana Vanderhoef also has a 50% stenosis.  Left ventricular function assessed by 30-degree RAO left  ventriculography is hyperdynamic. There are no regional wall motion  abnormalities. The LVEF is 75%.  ASSESSMENT:  1. Moderate distal left main stenosis.  2. Severe ostial left anterior descending stenosis.  3. Nonobstructive right coronary artery stenosis.  4. Normal to hyperdynamic left  ventricular function.  Ms. Scorsone has disease involving the distal left main and ostial left  anterior descending. I think she requires surgical consultation for  coronary bypass. If she has any resting symptoms, we will restart her  heparin.   Assessment and Plan  1. CAD - mild somewhat atypical chest pain that is overall stable - will increase norvasc as additional antianginal, I suspect she has not been on beta blocker in the past due to low normal resting heart rate - if symptoms progress, consider ischemic eval at that time  2. HTN - above goal, goal given her DM is <130/80 - given info on DASH diet, follow bp with norvasc increase - she will keep bp log and take with her to pcp next week  3. Hyperlipidemia - states she has upcoming labs with pcp, will f/u results - continue current statin for now.    F/u 4 months   Antoine Poche, M.D., F.A.C.C.

## 2014-09-04 ENCOUNTER — Ambulatory Visit (INDEPENDENT_AMBULATORY_CARE_PROVIDER_SITE_OTHER): Payer: BC Managed Care – PPO | Admitting: Cardiology

## 2014-09-04 ENCOUNTER — Encounter: Payer: Self-pay | Admitting: Cardiology

## 2014-09-04 VITALS — BP 153/80 | HR 58 | Ht 60.0 in | Wt 191.8 lb

## 2014-09-04 DIAGNOSIS — I2510 Atherosclerotic heart disease of native coronary artery without angina pectoris: Secondary | ICD-10-CM

## 2014-09-04 DIAGNOSIS — I10 Essential (primary) hypertension: Secondary | ICD-10-CM

## 2014-09-04 DIAGNOSIS — E785 Hyperlipidemia, unspecified: Secondary | ICD-10-CM

## 2014-09-04 NOTE — Patient Instructions (Signed)
There were no changes to your medications. Continue as directed. Please record your blood pressure on BP log given. You may return it at your next office visit with Dr. Olena Leatherwood Your physician wants you to follow up in: 6 months.  You will receive a reminder letter in the mail one-two months in advance.  If you don't receive a letter, please call our office to schedule the follow up appointment.

## 2014-09-04 NOTE — Progress Notes (Signed)
Clinical Summary [name] is a 68 y.o.female seen today for follow up of the following medical problems.   1. 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 LVEF was normal at that time by cath LV gram.  - denies any significant chest pain. Reports some occasional "twinges" , mild tightness in chest. Lasts just a few minutes. Can occur with rest or exertion. No other associated symptoms. Occurs approx 2-3 times a week. Stable frequency, stable severity.  - mixed compliance with meds, often difficult to remember given her work schedule.  - stable exercise tolerance    2. Hyperlipidemia  - compliant with lipitor    3. HTN  - does not check regularly at home - mixed compliance with meds   Past Medical History  Diagnosis Date  . Other specified cardiac dysrhythmias(427.89)   . Other and unspecified hyperlipidemia   . Unspecified essential hypertension   . 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 Type II or unspecified type diabetes mellitus without mention of complication, not stated as uncontrolled   . GERD (gastroesophageal reflux disease)   . Unspecified essential hypertension   . Depression   . Postinflammatory pulmonary fibrosis   . Pure hypercholesterolemia   . Other specified cardiac dysrhythmias(427.89)   . Postsurgical aortocoronary bypass status      No Known Allergies   Current Outpatient Prescriptions  Medication Sig Dispense Refill  . ALPRAZolam (XANAX) 0.5 MG tablet Take 0.5 mg by mouth as needed for sleep.       Marland Kitchen amLODipine (NORVASC) 10 MG tablet Take 1 tablet (10 mg total) by mouth daily.  30 tablet  6  . aspirin 81 MG tablet Take 81 mg by mouth daily.        Marland Kitchen atorvastatin (LIPITOR) 80 MG tablet TAKE ONE TABLET BY MOUTH IN THE EVENING  30 tablet  0  . glimepiride (AMARYL) 4 MG tablet Take 4 mg by mouth daily before breakfast.        . insulin  aspart (NOVOLOG) 100 UNIT/ML FlexPen Inject 10 Units into the skin 3 (three) times daily with meals.      . insulin glargine (LANTUS) 100 UNIT/ML injection Inject 40 Units into the skin at bedtime.       Marland Kitchen lisinopril-hydrochlorothiazide (PRINZIDE,ZESTORETIC) 20-25 MG per tablet Take 1 tablet by mouth daily.        . metFORMIN (GLUCOPHAGE) 1000 MG tablet Take 1,000 mg by mouth 2 (two) times daily with a meal.      . Multiple Vitamins-Minerals (OCUVITE PRESERVISION PO) Take 1 tablet by mouth daily.        . nitroGLYCERIN (NITROSTAT) 0.4 MG SL tablet Place 0.4 mg under the tongue every 5 (five) minutes as needed.        . Omega-3 Fatty Acids (FISH OIL) 1000 MG CAPS Take 1 capsule by mouth 2 (two) times daily.       Marland Kitchen omeprazole (PRILOSEC) 20 MG capsule Take 20 mg by mouth daily.      . traMADol (ULTRAM) 50 MG tablet Take 1 tablet by mouth as needed.        No current facility-administered medications for this visit.     Past Surgical History  Procedure Laterality Date  . Coronary artery bypass graft    05/29/2007      No Known Allergies  Family History  Problem Relation Age of Onset  . Cancer Other     Family Hx of Cancer  . Coronary artery disease Other     Family Hx of CAD     Social History [name] reports that she quit smoking about 12 years ago. Her smoking use included Cigarettes. She has a 34 pack-year smoking history. She has never used smokeless tobacco. [name] reports that she does not drink alcohol.   Review of Systems CONSTITUTIONAL: No weight loss, fever, chills, weakness or fatigue.  HEENT: Eyes: No visual loss, blurred vision, double vision or yellow sclerae.No hearing loss, sneezing, congestion, runny nose or sore throat.  SKIN: No rash or itching.  CARDIOVASCULAR: per HPI RESPIRATORY: No shortness of breath, cough or sputum.  GASTROINTESTINAL: No anorexia, nausea, vomiting or diarrhea. No abdominal pain or blood.  GENITOURINARY: No burning on  urination, no polyuria NEUROLOGICAL: No headache, dizziness, syncope, paralysis, ataxia, numbness or tingling in the extremities. No change in bowel or bladder control.  MUSCULOSKELETAL: No muscle, back pain, joint pain or stiffness.  LYMPHATICS: No enlarged nodes. No history of splenectomy.  PSYCHIATRIC: No history of depression or anxiety.  ENDOCRINOLOGIC: No reports of sweating, cold or heat intolerance. No polyuria or polydipsia.  Marland Kitchen   Physical Examination Filed Vitals:   09/04/14 1542  BP: 153/80  Pulse: 58   Filed Weights   09/04/14 1542  Weight: 191 lb 12.8 oz (87 kg)    Gen: resting comfortably, no acute distress HEENT: no scleral icterus, pupils equal round and reactive, no palptable cervical adenopathy,  CV: RRR, no m/r/g, no JVD, no carotid bruits Resp: Clear to auscultation bilaterally GI: abdomen is soft, non-tender, non-distended, normal bowel sounds, no hepatosplenomegaly MSK: extremities are warm, no edema. Right chestwall is tender to palpation Skin: warm, no rash Neuro:  no focal deficits Psych: appropriate affect   Diagnostic Studies Cath 04/2007  FINDINGS: Aortic pressure 115/52 with a mean of 76. Left ventricular  pressure 122/10.  CORONARY ANGIOGRAPHY: Left main is angiographically normal in its  proximal and mid portion. It tapers distally and there is a 50-60%  stenosis in the distal left main.  The LAD is a large-caliber vessel in its proximal portion. The LAD only  extends down to the mid anterior wall. There is a brick. There is a  diagonal [name] in the territory of a second diagonal that is the  dominant vessel. There are two septal perforators. The ostium of the  LAD has an 80% stenosis involving the true ostium. It appears very  focal and is difficult to lay out and to lay out by angiography, but  there is clearly a high-grade stenosis there.  The ramus intermedius has 40% stenosis in the mid portion. It is a  medium caliber vessel. The left  circumflex has no significant  angiographic disease. It provides a second OM [name] site that supplies  a second and third OM [name]. The AV groove circumflex is small.  The right coronary artery is diffusely diseased. Proximal portion of  the vessel has luminal irregularities mid portion has a 40 at the site  30-40% stenosis. The vessel branches into a PDA and also provides two  PL branches. The PDA has a 50% stenosis in its mid portion. The first  PL Elainah Rhyne also has a 50% stenosis.  Left ventricular function assessed by 30-degree RAO left  ventriculography is hyperdynamic. There are no regional wall motion  abnormalities. The LVEF is 75%.  ASSESSMENT:  1. Moderate distal left main stenosis.  2. Severe ostial left anterior descending stenosis.  3. Nonobstructive right coronary artery stenosis.  4. Normal to hyperdynamic left ventricular function.  [name] has disease involving the distal left main and ostial left  anterior descending. I think she requires surgical consultation for  coronary bypass. If she has any resting symptoms, we will restart her  heparin.     Assessment and Plan  1. CAD  - mild atypical chest pain that is overall stable, reproducible with palpation in clinic - continue current medical therapy  2. HTN  - above goal, goal given her DM is <130/80  -she reports her bps' often are elevated in clinic but normal at home, she will keep a bp log until her appointment next month with her pcp. Continue current meds for now  3. Hyperlipidemia  - continue high dose statin in setting of known CAD     F/u 6 months   Antoine PocheJonathan F. Devina Bezold, M.D.

## 2015-03-29 ENCOUNTER — Encounter: Payer: Self-pay | Admitting: Cardiology

## 2015-03-29 ENCOUNTER — Ambulatory Visit (INDEPENDENT_AMBULATORY_CARE_PROVIDER_SITE_OTHER): Payer: BLUE CROSS/BLUE SHIELD | Admitting: Cardiology

## 2015-03-29 VITALS — BP 145/75 | HR 67 | Ht 60.0 in | Wt 198.8 lb

## 2015-03-29 DIAGNOSIS — Z951 Presence of aortocoronary bypass graft: Secondary | ICD-10-CM

## 2015-03-29 MED ORDER — LISINOPRIL 40 MG PO TABS: 40.0000 mg | ORAL_TABLET | Freq: Every day | ORAL | Status: DC

## 2015-03-29 NOTE — Patient Instructions (Signed)
Your physician wants you to follow-up in: 6 MONTHS WITH DR. BRANCH You will receive a reminder letter in the mail two months in advance. If you don't receive a letter, please call our office to schedule the follow-up appointment.  Your physician has recommended you make the following change in your medication:   STOP PRINZIDE  START LISINOPRIL 40 MG DAILY  CONTINUE ALL OTHER MEDICATIONS AS DIRECTED  Your physician recommends that you return for lab work in: 2 WEEKS BMP  Thank you for choosing [facility]!!

## 2015-03-29 NOTE — Progress Notes (Signed)
Clinical Summary [name] is a 69 y.o.female  Seen today for follow up of the following medical problems.   1. 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 LVEF was normal at that time by cath LV gram.  - has had atypical chest pain for quite some time. Sharp pain that lasts just a few seconds, worst with palpation. No SOB, no DOE - compliant with meds:    2. Hyperlipidemia  - compliant with lipitor    3. HTN  -compliant with meds    Past Medical History  Diagnosis Date  . Other specified cardiac dysrhythmias(427.89)   . Other and unspecified hyperlipidemia   . Unspecified essential hypertension   . 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 Type II or unspecified type diabetes mellitus without mention of complication, not stated as uncontrolled   . GERD (gastroesophageal reflux disease)   . Unspecified essential hypertension   . Depression   . Postinflammatory pulmonary fibrosis   . Pure hypercholesterolemia   . Other specified cardiac dysrhythmias(427.89)   . Postsurgical aortocoronary bypass status      No Known Allergies   Current Outpatient Prescriptions  Medication Sig Dispense Refill  . ALPRAZolam (XANAX) 0.5 MG tablet Take 0.5 mg by mouth as needed for sleep.     Marland Kitchen amLODipine (NORVASC) 10 MG tablet Take 1 tablet (10 mg total) by mouth daily. 30 tablet 6  . aspirin 81 MG tablet Take 81 mg by mouth daily.      Marland Kitchen atorvastatin (LIPITOR) 80 MG tablet TAKE ONE TABLET BY MOUTH IN THE EVENING 30 tablet 0  . glimepiride (AMARYL) 4 MG tablet Take 4 mg by mouth daily before breakfast.      . insulin aspart (NOVOLOG) 100 UNIT/ML FlexPen Inject 10 Units into the skin 3 (three) times daily with meals.    . insulin glargine (LANTUS) 100 UNIT/ML injection Inject 40 Units into the skin at bedtime.     Marland Kitchen lisinopril-hydrochlorothiazide (PRINZIDE,ZESTORETIC) 20-25 MG  per tablet Take 1 tablet by mouth daily.      . metFORMIN (GLUCOPHAGE) 1000 MG tablet Take 1,000 mg by mouth 2 (two) times daily with a meal.    . Multiple Vitamins-Minerals (OCUVITE PRESERVISION PO) Take 1 tablet by mouth daily.      . nitroGLYCERIN (NITROSTAT) 0.4 MG SL tablet Place 0.4 mg under the tongue every 5 (five) minutes as needed.      . Omega-3 Fatty Acids (FISH OIL) 1000 MG CAPS Take 1 capsule by mouth 2 (two) times daily.     Marland Kitchen omeprazole (PRILOSEC) 20 MG capsule Take 20 mg by mouth daily.    . traMADol (ULTRAM) 50 MG tablet Take 1 tablet by mouth as needed.      No current facility-administered medications for this visit.     Past Surgical History  Procedure Laterality Date  . Coronary artery bypass graft    05/29/2007      No Known Allergies    Family History  Problem Relation Age of Onset  . Cancer Other     Family Hx of Cancer  . Coronary artery disease Other     Family Hx of CAD     Social History [name] reports that she quit smoking about 13 years ago. Her smoking use included Cigarettes. She has a 34 pack-year smoking history. She has  never used smokeless tobacco. [name] reports that she does not drink alcohol.   Review of Systems CONSTITUTIONAL: No weight loss, fever, chills, weakness or fatigue.  HEENT: Eyes: No visual loss, blurred vision, double vision or yellow sclerae.No hearing loss, sneezing, congestion, runny nose or sore throat.  SKIN: No rash or itching.  CARDIOVASCULAR: per HPI RESPIRATORY: No shortness of breath, cough or sputum.  GASTROINTESTINAL: No anorexia, nausea, vomiting or diarrhea. No abdominal pain or blood.  GENITOURINARY: No burning on urination, no polyuria NEUROLOGICAL: No headache, dizziness, syncope, paralysis, ataxia, numbness or tingling in the extremities. No change in bowel or bladder control.  MUSCULOSKELETAL: No muscle, back pain, joint pain or stiffness.  LYMPHATICS: No enlarged nodes. No history of  splenectomy.  PSYCHIATRIC: No history of depression or anxiety.  ENDOCRINOLOGIC: No reports of sweating, cold or heat intolerance. No polyuria or polydipsia.  Marland Kitchen   Physical Examination p 67 bp 145/75 Wt 198 lbs BMI 39 Gen: resting comfortably, no acute distress HEENT: no scleral icterus, pupils equal round and reactive, no palptable cervical adenopathy,  CV: RRR, no m/r/g, no JVD, no carotid bruits Resp: Clear to auscultation bilaterally GI: abdomen is soft, non-tender, non-distended, normal bowel sounds, no hepatosplenomegaly MSK: extremities are warm, no edema.  Skin: warm, no rash Neuro:  no focal deficits Psych: appropriate affect   Diagnostic Studies  Cath 04/2007  FINDINGS: Aortic pressure 115/52 with a mean of 76. Left ventricular  pressure 122/10.  CORONARY ANGIOGRAPHY: Left main is angiographically normal in its  proximal and mid portion. It tapers distally and there is a 50-60%  stenosis in the distal left main.  The LAD is a large-caliber vessel in its proximal portion. The LAD only  extends down to the mid anterior wall. There is a brick. There is a  diagonal Kishawn Pickar in the territory of a second diagonal that is the  dominant vessel. There are two septal perforators. The ostium of the  LAD has an 80% stenosis involving the true ostium. It appears very  focal and is difficult to lay out and to lay out by angiography, but  there is clearly a high-grade stenosis there.  The ramus intermedius has 40% stenosis in the mid portion. It is a  medium caliber vessel. The left circumflex has no significant  angiographic disease. It provides a second OM [place] site that supplies  a second and third OM Marion Seese. The AV groove circumflex is small.  The right coronary artery is diffusely diseased. Proximal portion of  the vessel has luminal irregularities mid portion has a 40 at the site  30-40% stenosis. The vessel branches into a PDA and also provides two  PL  branches. The PDA has a 50% stenosis in its mid portion. The first  PL Alinda Egolf also has a 50% stenosis.  Left ventricular function assessed by 30-degree RAO left  ventriculography is hyperdynamic. There are no regional wall motion  abnormalities. The LVEF is 75%.  ASSESSMENT:  1. Moderate distal left main stenosis.  2. Severe ostial left anterior descending stenosis.  3. Nonobstructive right coronary artery stenosis.  4. Normal to hyperdynamic left ventricular function.  [name] has disease involving the distal left main and ostial left  anterior descending. I think she requires surgical consultation for  coronary bypass. If she has any resting symptoms, we will restart her  heparin.     Assessment and Plan   1. CAD  - no current symptoms - continue current medical therapy  2.  HTN  - above goal - appears she is on both HCTZ and chlorthatlidone. Will stop her prinzide and write separate prescription for lisinopril for high dose of  daily. Repeat BMET in 2 weeks. Goal BP given her DM is <130/80  3. Hyperlipidemia  - continue high dose statin in setting of known CAD  - request most recent labs from pcp    Antoine Poche, M.D.

## 2015-04-27 ENCOUNTER — Telehealth: Payer: Self-pay | Admitting: Cardiology

## 2015-04-27 NOTE — Telephone Encounter (Signed)
Patient calls stating that she is retaining fluid in both legs since 4 days ago.

## 2015-04-27 NOTE — Telephone Encounter (Signed)
Spoke with patient and she informed nurse that she has not been taking the chlorthalidone because she thought she was advised to stop taking it. Patient advised to start taking the chlorthalidone and to contact our office if she didn't have improvement with retaining fluid. Patient verbalized understanding of plan.

## 2015-04-27 NOTE — Telephone Encounter (Signed)
Patient said that since her prinizide was stopped and she was placed on lisinopril, she has been retaining fluid in both of her legs everyday. No c/o sob, dizziness or chest pain. Patient said she weighed 195 lbs prior to the med change and now weighs 203 lbs that increased within 2 days. Patient said she wears compression stockings and has not increased her salt intake.

## 2015-04-27 NOTE — Telephone Encounter (Signed)
Is she still taking her chlorthatlidone? If so then can start lasix 20mg  daily prn swelling, would take for next 3 days and then prn after that.

## 2016-07-17 ENCOUNTER — Other Ambulatory Visit: Payer: Self-pay | Admitting: *Deleted

## 2016-07-17 MED ORDER — LISINOPRIL 40 MG PO TABS: 40.0000 mg | ORAL_TABLET | Freq: Every day | ORAL | 0 refills | Status: DC

## 2016-08-31 ENCOUNTER — Other Ambulatory Visit: Payer: Self-pay | Admitting: Cardiology

## 2016-09-21 ENCOUNTER — Ambulatory Visit (INDEPENDENT_AMBULATORY_CARE_PROVIDER_SITE_OTHER): Payer: BLUE CROSS/BLUE SHIELD | Admitting: Cardiology

## 2016-09-21 ENCOUNTER — Encounter: Payer: Self-pay | Admitting: *Deleted

## 2016-09-21 ENCOUNTER — Encounter: Payer: Self-pay | Admitting: Cardiology

## 2016-09-21 VITALS — BP 121/74 | HR 67 | Ht 60.0 in | Wt 197.4 lb

## 2016-09-21 DIAGNOSIS — E782 Mixed hyperlipidemia: Secondary | ICD-10-CM

## 2016-09-21 DIAGNOSIS — I10 Essential (primary) hypertension: Secondary | ICD-10-CM

## 2016-09-21 DIAGNOSIS — I2510 Atherosclerotic heart disease of native coronary artery without angina pectoris: Secondary | ICD-10-CM

## 2016-09-21 DIAGNOSIS — R0602 Shortness of breath: Secondary | ICD-10-CM | POA: Diagnosis not present

## 2016-09-21 NOTE — Progress Notes (Signed)
Clinical Summary Ms. Amie CritchleyBlackwell is a 70 y.o.female Seen today for follow up of the following medical problems.   1. 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 LVEF was normal at that time by cath LV gram.  - has had atypical chest pain for quite some time. Sharp pain that lasts just a few seconds, worst with palpation. No SOB, no DOE - compliant with meds.  - atypical chest pain unchanged since last visit. Reports the other night episode was longer than normal, lasted about 15 minutes. Has had some DOE, example taking out trash or bringing groceries in. Chronic LE edema unchanged. - compliant with meds   2. Hyperlipidemia  - compliant with lipitor    3. HTN  -compliant with meds   SH: works at [employer].  Past Medical History:  Diagnosis Date  . Depression   . GERD (gastroesophageal reflux disease)   . Other and unspecified hyperlipidemia   . Other specified cardiac dysrhythmias(427.89)   . Other specified cardiac dysrhythmias(427.89)   . Postinflammatory pulmonary fibrosis   . Postsurgical aortocoronary bypass status   . Pure hypercholesterolemia   . 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 Type II or unspecified type diabetes mellitus without mention of complication, not stated as uncontrolled   . Unspecified essential hypertension   . Unspecified essential hypertension      No Known Allergies   Current Outpatient Prescriptions  Medication Sig Dispense Refill  . ALPRAZolam (XANAX) 0.5 MG tablet Take 0.5 mg by mouth as needed for sleep.     Marland Kitchen. amLODipine (NORVASC) 10 MG tablet Take 1 tablet (10 mg total) by mouth daily. 30 tablet 6  . aspirin 81 MG tablet Take 81 mg by mouth daily.      Marland Kitchen. atorvastatin (LIPITOR) 80 MG tablet TAKE ONE TABLET BY MOUTH IN THE EVENING 30 tablet 0  . chlorthalidone (HYGROTON) 25 MG tablet Take 25 mg by mouth daily.      Marland Kitchen. glimepiride (AMARYL) 4 MG tablet Take 4 mg by mouth daily before breakfast.      . insulin aspart (NOVOLOG) 100 UNIT/ML FlexPen Inject 10 Units into the skin 3 (three) times daily with meals.    . insulin glargine (LANTUS) 100 UNIT/ML injection Inject 40 Units into the skin at bedtime.     Marland Kitchen. lisinopril (PRINIVIL,ZESTRIL) 40 MG tablet TAKE ONE TABLET BY MOUTH ONCE DAILY *NEED  OFFICE  VISIT* 30 tablet 0  . metFORMIN (GLUCOPHAGE) 1000 MG tablet Take 1,000 mg by mouth 2 (two) times daily with a meal.    . Multiple Vitamins-Minerals (OCUVITE PRESERVISION PO) Take 1 tablet by mouth daily.      . nitroGLYCERIN (NITROSTAT) 0.4 MG SL tablet Place 0.4 mg under the tongue every 5 (five) minutes as needed.      . Omega-3 Fatty Acids (FISH OIL) 1000 MG CAPS Take 1 capsule by mouth 2 (two) times daily.     Marland Kitchen. omeprazole (PRILOSEC) 20 MG capsule Take 20 mg by mouth daily.    . traMADol (ULTRAM) 50 MG tablet Take 1 tablet by mouth as needed.      No current facility-administered medications for this visit.      Past Surgical History:  Procedure Laterality Date  . CORONARY ARTERY BYPASS GRAFT    05/29/2007      No Known Allergies  Family History  Problem Relation Age of Onset  . Cancer Other     Family Hx of Cancer  . Coronary artery disease Other     Family Hx of CAD     Social History Ms. Quarles reports that she quit smoking about 14 years ago. Her smoking use included Cigarettes. She has a 34.00 pack-year smoking history. She has never used smokeless tobacco. Ms. Pasley reports that she does not drink alcohol.   Review of Systems CONSTITUTIONAL: No weight loss, fever, chills, weakness or fatigue.  HEENT: Eyes: No visual loss, blurred vision, double vision or yellow sclerae.No hearing loss, sneezing, congestion, runny nose or sore throat.  SKIN: No rash or itching.  CARDIOVASCULAR: per hpi RESPIRATORY: +SOB GASTROINTESTINAL: No anorexia, nausea, vomiting or diarrhea. No  abdominal pain or blood.  GENITOURINARY: No burning on urination, no polyuria NEUROLOGICAL: No headache, dizziness, syncope, paralysis, ataxia, numbness or tingling in the extremities. No change in bowel or bladder control.  MUSCULOSKELETAL: No muscle, back pain, joint pain or stiffness.  LYMPHATICS: No enlarged nodes. No history of splenectomy.  PSYCHIATRIC: No history of depression or anxiety.  ENDOCRINOLOGIC: No reports of sweating, cold or heat intolerance. No polyuria or polydipsia.  Marland Kitchen   Physical Examination Vitals:   09/21/16 1322  BP: 121/74  Pulse: 67   Vitals:   09/21/16 1322  Weight: 197 lb 6.4 oz (89.5 kg)  Height: 5' (1.524 m)    Gen: resting comfortably, no acute distress HEENT: no scleral icterus, pupils equal round and reactive, no palptable cervical adenopathy,  CV: RRR, no m/r/g, no jvd Resp: Clear to auscultation bilaterally GI: abdomen is soft, non-tender, non-distended, normal bowel sounds, no hepatosplenomegaly MSK: extremities are warm, no edema.  Skin: warm, no rash Neuro:  no focal deficits Psych: appropriate affect   Diagnostic Studies Cath 04/2007  FINDINGS: Aortic pressure 115/52 with a mean of 76. Left ventricular  pressure 122/10.  CORONARY ANGIOGRAPHY: Left main is angiographically normal in its  proximal and mid portion. It tapers distally and there is a 50-60%  stenosis in the distal left main.  The LAD is a large-caliber vessel in its proximal portion. The LAD only  extends down to the mid anterior wall. There is a brick. There is a  diagonal Ariel Dimitri in the territory of a second diagonal that is the  dominant vessel. There are two septal perforators. The ostium of the  LAD has an 80% stenosis involving the true ostium. It appears very  focal and is difficult to lay out and to lay out by angiography, but  there is clearly a high-grade stenosis there.  The ramus intermedius has 40% stenosis in the mid portion. It is a  medium  caliber vessel. The left circumflex has no significant  angiographic disease. It provides a second OM [place] Wh[place]ide site that supplies  a second and third OM Yarixa Lightcap. The AV groove circumflex is small.  The right coronary artery is diffusely diseased. Proximal portion of  the vessel has luminal irregularities mid portion has a 40 at the site  30-40% stenosis. The vessel branches into a PDA and also provides two  PL branches. The PDA has a 50% stenosis in its mid portion. The first  PL [place] Clasby also has a 50% stenosis.  Left ventricular function assessed by 30-degree RAO left  ventriculography is hyperdynamic. There are no regional wall motion  abnormalities. The LVEF is 75%.  ASSESSMENT:  1. Moderate distal left main stenosis.  2. Severe ostial left  anterior descending stenosis.  3. Nonobstructive right coronary artery stenosis.  4. Normal to hyperdynamic left ventricular function.  Ms. Tokarczyk has disease involving the distal left main and ostial left  anterior descending. I think she requires surgical consultation for  coronary bypass. If she has any resting symptoms, we will restart her  heparin.    Assessment and Plan  1. CAD  - recent symptoms of chest pain and SOB - EKG in clinic NSR without acute ischemic changes - we will obtain echo initallly, pending results likely consider lexiscan MPI.   2. HTN  - at goal, continue current meds  3. Hyperlipidemia  - continue high dose statin in setting of known CAD     F/u pending test results.    Antoine Poche, M.D

## 2016-09-21 NOTE — Patient Instructions (Signed)
Your physician recommends that you schedule a follow-up appointment TO BE DETERMINED AFTER TEST RESULTS  Your physician recommends that you continue on your current medications as directed. Please refer to the Current Medication list given to you today.  Your physician has requested that you have an echocardiogram. Echocardiography is a painless test that uses sound waves to create images of your heart. It provides your doctor with information about the size and shape of your heart and how well your heart's chambers and valves are working. This procedure takes approximately one hour. There are no restrictions for this procedure.   Thank you for choosing [facility]!!

## 2016-10-04 ENCOUNTER — Ambulatory Visit (INDEPENDENT_AMBULATORY_CARE_PROVIDER_SITE_OTHER): Payer: BLUE CROSS/BLUE SHIELD

## 2016-10-04 ENCOUNTER — Other Ambulatory Visit: Payer: Self-pay

## 2016-10-04 DIAGNOSIS — R0602 Shortness of breath: Secondary | ICD-10-CM

## 2016-10-04 LAB — ECHOCARDIOGRAM COMPLETE
CHL CUP DOP CALC LVOT VTI: 29.6 cm
CHL CUP MV DEC (S): 248
CHL CUP STROKE VOLUME: 42 mL
E decel time: 248 msec
E/e' ratio: 6.78
FS: 49 % — AB (ref 28–44)
IV/PV OW: 1.5
LA ID, A-P, ES: 41 mm
LA diam index: 2.2 cm/m2
LAVOL: 35.5 mL
LAVOLA4C: 28.7 mL
LAVOLIN: 19.1 mL/m2
LDCA: 2.84 cm2
LEFT ATRIUM END SYS DIAM: 41 mm
LV E/e'average: 6.78
LV sys vol index: 5 mL/m2
LV sys vol: 9 mL — AB (ref 14–42)
LVDIAVOL: 51 mL (ref 46–106)
LVDIAVOLIN: 27 mL/m2
LVEEMED: 6.78
LVELAT: 12.6 cm/s
LVOT peak grad rest: 5 mmHg
LVOT peak vel: 116 cm/s
LVOTD: 19 mm
LVOTSV: 84 mL
MV pk E vel: 85.4 m/s
MVPG: 3 mmHg
MVPKAVEL: 69.8 m/s
PW: 6.17 mm — AB (ref 0.6–1.1)
Simpson's disk: 82
TAPSE: 8.7 mm
TDI e' lateral: 12.6
TDI e' medial: 5.9

## 2016-10-05 ENCOUNTER — Telehealth: Payer: Self-pay | Admitting: *Deleted

## 2016-10-05 ENCOUNTER — Encounter: Payer: Self-pay | Admitting: *Deleted

## 2016-10-05 DIAGNOSIS — R079 Chest pain, unspecified: Secondary | ICD-10-CM

## 2016-10-05 NOTE — Telephone Encounter (Signed)
-----   Message from Antoine Poche, MD sent at 10/05/2016  2:50 PM EST ----- Echo looks good, nothing to exlapin her symptoms. Please order a lexiscan for chest pain/SOB, does not need to hold any meds   Dominga Ferry MD

## 2016-10-05 NOTE — Telephone Encounter (Signed)
Pt aware and agreeable to stress test - placed orders and will forward to schedulers - pt can go Friday of next week or the following Wednesday

## 2016-10-09 ENCOUNTER — Other Ambulatory Visit: Payer: Self-pay

## 2016-10-09 DIAGNOSIS — R072 Precordial pain: Secondary | ICD-10-CM

## 2016-10-10 ENCOUNTER — Encounter (HOSPITAL_COMMUNITY): Payer: BLUE CROSS/BLUE SHIELD

## 2016-10-10 ENCOUNTER — Encounter (HOSPITAL_COMMUNITY): Payer: Self-pay

## 2016-10-10 ENCOUNTER — Encounter (HOSPITAL_COMMUNITY)
Admission: RE | Admit: 2016-10-10 | Discharge: 2016-10-10 | Disposition: A | Discharge status: Home / Self Care | Payer: BLUE CROSS/BLUE SHIELD | Source: Ambulatory Visit | Attending: Internal Medicine | Admitting: Internal Medicine

## 2016-10-10 ENCOUNTER — Inpatient Hospital Stay (HOSPITAL_COMMUNITY): Admission: RE | Admit: 2016-10-10 | Payer: BLUE CROSS/BLUE SHIELD | Source: Ambulatory Visit

## 2016-10-10 DIAGNOSIS — R072 Precordial pain: Secondary | ICD-10-CM | POA: Diagnosis present

## 2016-10-10 LAB — NM MYOCAR MULTI W/SPECT W/WALL MOTION / EF
CHL CUP NUCLEAR SDS: 1
CHL CUP RESTING HR STRESS: 57 {beats}/min
LV dias vol: 43 mL (ref 46–106)
LVSYSVOL: 7 mL
Peak HR: 93 {beats}/min
RATE: 0.46
SRS: 4
SSS: 5
TID: 0.97

## 2016-10-10 MED ORDER — SODIUM CHLORIDE 0.9% FLUSH
INTRAVENOUS | Status: AC
  Administered 2016-10-10: 10 mL via INTRAVENOUS
  Filled 2016-10-10: qty 10

## 2016-10-10 MED ORDER — TECHNETIUM TC 99M TETROFOSMIN IV KIT
30.0000 | PACK | Freq: Once | INTRAVENOUS | Status: AC | PRN
  Administered 2016-10-10: 32 via INTRAVENOUS

## 2016-10-10 MED ORDER — REGADENOSON 0.4 MG/5ML IV SOLN
INTRAVENOUS | Status: AC
  Administered 2016-10-10: 0.4 mg via INTRAVENOUS
  Filled 2016-10-10: qty 5

## 2016-10-10 MED ORDER — TECHNETIUM TC 99M TETROFOSMIN IV KIT
10.0000 | PACK | Freq: Once | INTRAVENOUS | Status: AC | PRN
  Administered 2016-10-10: 11 via INTRAVENOUS

## 2016-10-10 MED ORDER — SODIUM CHLORIDE 0.9% FLUSH
INTRAVENOUS | Status: AC
  Filled 2016-10-10: qty 120

## 2016-10-11 ENCOUNTER — Encounter: Payer: Self-pay | Admitting: *Deleted

## 2016-10-12 ENCOUNTER — Other Ambulatory Visit: Payer: Self-pay | Admitting: Cardiology

## 2016-11-17 ENCOUNTER — Other Ambulatory Visit: Payer: Self-pay | Admitting: Cardiology

## 2016-11-24 ENCOUNTER — Other Ambulatory Visit: Payer: Self-pay | Admitting: Cardiology

## 2016-12-06 ENCOUNTER — Telehealth: Payer: Self-pay | Admitting: *Deleted

## 2016-12-06 NOTE — Telephone Encounter (Signed)
Scheduled for 01/25/17 next available

## 2016-12-06 NOTE — Telephone Encounter (Signed)
-----   Message from Antoine Poche, MD sent at 12/06/2016  2:30 PM EST ----- Can f/u 1 month  Dominga Ferry MD ----- Message ----- From: Albertine Patricia, CMA Sent: 11/24/2016  11:27 AM To: Antoine Poche, MD  When did you want to f/u with this pt?  Mckennah Kretchmer

## 2017-01-24 ENCOUNTER — Encounter: Payer: Self-pay | Admitting: *Deleted

## 2017-01-25 ENCOUNTER — Ambulatory Visit: Payer: BLUE CROSS/BLUE SHIELD | Admitting: Cardiology

## 2017-02-08 ENCOUNTER — Encounter: Payer: Self-pay | Admitting: Cardiology

## 2017-02-08 ENCOUNTER — Ambulatory Visit (INDEPENDENT_AMBULATORY_CARE_PROVIDER_SITE_OTHER): Payer: BLUE CROSS/BLUE SHIELD | Admitting: Cardiology

## 2017-02-08 VITALS — BP 147/69 | HR 65 | Ht 60.0 in | Wt 198.2 lb

## 2017-02-08 DIAGNOSIS — E119 Type 2 diabetes mellitus without complications: Secondary | ICD-10-CM | POA: Diagnosis not present

## 2017-02-08 DIAGNOSIS — E782 Mixed hyperlipidemia: Secondary | ICD-10-CM | POA: Diagnosis not present

## 2017-02-08 DIAGNOSIS — I2510 Atherosclerotic heart disease of native coronary artery without angina pectoris: Secondary | ICD-10-CM | POA: Diagnosis not present

## 2017-02-08 DIAGNOSIS — R079 Chest pain, unspecified: Secondary | ICD-10-CM | POA: Diagnosis not present

## 2017-02-08 DIAGNOSIS — I10 Essential (primary) hypertension: Secondary | ICD-10-CM | POA: Diagnosis not present

## 2017-02-08 NOTE — Patient Instructions (Signed)
Your physician wants you to follow-up in: 6 months with Dr. Wyline Mood. You will receive a reminder letter in the mail two months in advance. If you don't receive a letter, please call our office to schedule the follow-up appointment.  Your physician recommends that you continue on your current medications as directed. Please refer to the Current Medication list given to you today.  Your physician has requested that you regularly monitor and record your blood pressure readings at home for one week and call or bring in readings. Please use the same machine at the same time of day to check your readings and record them to bring to your follow-up visit.

## 2017-02-08 NOTE — Progress Notes (Signed)
Clinical Summary [name] is a 70 y.o.female seen today for follow up of the following medical problems.   1. 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 LVEF was normal at that time by cath LV gram.  - has had atypical chest pain for quite some time. Sharp pain that lasts just a few seconds, worst with palpation. No SOB, no DOE - echo 09/2016 LVEF 75-80%, no WMAs, cannot eval diastolic function. - 09/2016 Lexiscan MPI: no ischemia.    - some SOB she relates to recent bronchitis. Occasional chest pain at times, sporadic. Typically right sided. Overall stable x 2 years.  - compliant with meds  2. Hyperlipidemia  - compliant with statin therapy   3. HTN  -compliant with meds - checks at bp at home, 140s/60s.   4. DM2 - last 06/2016 Hgb A1c 13.  - followed by pcp    SH: works at [employer].   Past Medical History:  Diagnosis Date  . Depression   . GERD (gastroesophageal reflux disease)   . Other and unspecified hyperlipidemia   . Other specified cardiac dysrhythmias(427.89)   . Other specified cardiac dysrhythmias(427.89)   . Postinflammatory pulmonary fibrosis (HCC)   . Postsurgical aortocoronary bypass status   . Pure hypercholesterolemia   . 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 Type II or unspecified type diabetes mellitus without mention of complication, not stated as uncontrolled   . Unspecified essential hypertension   . Unspecified essential hypertension      No Known Allergies   Current Outpatient Prescriptions  Medication Sig Dispense Refill  . ALPRAZolam (XANAX) 0.5 MG tablet Take 0.5 mg by mouth as needed for sleep.     Marland Kitchen amLODipine (NORVASC) 5 MG tablet Take 5 mg by mouth daily.    Marland Kitchen aspirin 81 MG tablet Take 81 mg by mouth daily.      Marland Kitchen atorvastatin (LIPITOR) 40 MG tablet Take 1 tablet by mouth daily.  0  .  chlorthalidone (HYGROTON) 25 MG tablet Take 25 mg by mouth daily.    Marland Kitchen glimepiride (AMARYL) 4 MG tablet Take 4 mg by mouth daily before breakfast.      . insulin aspart (NOVOLOG) 100 UNIT/ML FlexPen Inject 15 Units into the skin 3 (three) times daily with meals.     . insulin glargine (LANTUS) 100 UNIT/ML injection Inject 40 Units into the skin at bedtime.     Marland Kitchen lisinopril (PRINIVIL,ZESTRIL) 40 MG tablet TAKE ONE TABLET BY MOUTH ONCE DAILY NEEDS  OFFICE  VISIT 30 tablet 0  . lisinopril (PRINIVIL,ZESTRIL) 40 MG tablet TAKE ONE TABLET BY MOUTH ONCE DAILY NEEDS  OFFICE  VISIT 30 tablet 0  . metFORMIN (GLUCOPHAGE) 1000 MG tablet Take 1,000 mg by mouth 2 (two) times daily with a meal.    . nitroGLYCERIN (NITROSTAT) 0.4 MG SL tablet Place 0.4 mg under the tongue every 5 (five) minutes as needed.      Marland Kitchen omeprazole (PRILOSEC) 20 MG capsule Take 20 mg by mouth daily.    . traMADol (ULTRAM) 50 MG tablet Take 1 tablet by mouth as needed.      No current facility-administered medications for this visit.      Past Surgical History:  Procedure Laterality Date  . CORONARY ARTERY BYPASS GRAFT    05/29/2007      No Known Allergies  Family History  Problem Relation Age of Onset  . Cancer Other     Family Hx of Cancer  . Coronary artery disease Other     Family Hx of CAD     Social History [name] reports that she quit smoking about 15 years ago. Her smoking use included Cigarettes. She has a 34.00 pack-year smoking history. She has never used smokeless tobacco. [name] reports that she does not drink alcohol.   Review of Systems CONSTITUTIONAL: No weight loss, fever, chills, weakness or fatigue.  HEENT: Eyes: No visual loss, blurred vision, double vision or yellow sclerae.No hearing loss, sneezing, congestion, runny nose or sore throat.  SKIN: No rash or itching.  CARDIOVASCULAR: per hpi RESPIRATORY: No shortness of breath, cough or sputum.  GASTROINTESTINAL: No anorexia,  nausea, vomiting or diarrhea. No abdominal pain or blood.  GENITOURINARY: No burning on urination, no polyuria NEUROLOGICAL: No headache, dizziness, syncope, paralysis, ataxia, numbness or tingling in the extremities. No change in bowel or bladder control.  MUSCULOSKELETAL: No muscle, back pain, joint pain or stiffness.  LYMPHATICS: No enlarged nodes. No history of splenectomy.  PSYCHIATRIC: No history of depression or anxiety.  ENDOCRINOLOGIC: No reports of sweating, cold or heat intolerance. No polyuria or polydipsia.  Marland Kitchen   Physical Examination Vitals:   02/08/17 1339  BP: (!) 147/69  Pulse: 65   Vitals:   02/08/17 1339  Weight: 198 lb 3.2 oz (89.9 kg)  Height: 5' (1.524 m)    Gen: resting comfortably, no acute distress HEENT: no scleral icterus, pupils equal round and reactive, no palptable cervical adenopathy,  CV: RRR, no m/r/g, no jvd Resp: Clear to auscultation bilaterally GI: abdomen is soft, non-tender, non-distended, normal bowel sounds, no hepatosplenomegaly MSK: extremities are warm, no edema.  Skin: warm, no rash Neuro:  no focal deficits Psych: appropriate affect   Diagnostic Studies  Cath 04/2007 FINDINGS: Aortic pressure 115/52 with a mean of 76. Left ventricular  pressure 122/10.  CORONARY ANGIOGRAPHY: Left main is angiographically normal in its  proximal and mid portion. It tapers distally and there is a 50-60%  stenosis in the distal left main.  The LAD is a large-caliber vessel in its proximal portion. The LAD only  extends down to the mid anterior wall. There is a brick. There is a  diagonal branch in the territory of a second diagonal that is the  dominant vessel. There are two septal perforators. The ostium of the  LAD has an 80% stenosis involving the true ostium. It appears very  focal and is difficult to lay out and to lay out by angiography, but  there is clearly a high-grade stenosis there.  The ramus intermedius has 40% stenosis  in the mid portion. It is a  medium caliber vessel. The left circumflex has no significant  angiographic disease. It provides a second OM branch site that supplies  a second and third OM branch. The AV groove circumflex is small.  The right coronary artery is diffusely diseased. Proximal portion of  the vessel has luminal irregularities mid portion has a 40 at the site  30-40% stenosis. The vessel branches into a PDA and also provides two  PL branches. The PDA has a 50% stenosis in its mid portion. The first  PL branch also has a 50% stenosis.  Left ventricular function assessed by 30-degree RAO left  ventriculography is hyperdynamic. There are no regional wall motion  abnormalities. The LVEF is 75%.  ASSESSMENT:  1. Moderate distal  left main stenosis.  2. Severe ostial left anterior descending stenosis.  3. Nonobstructive right coronary artery stenosis.  4. Normal to hyperdynamic left ventricular function.  [name] has disease involving the distal left main and ostial left  anterior descending. I think she requires surgical consultation for  coronary bypass. If she has any resting symptoms, we will restart her  heparin.  09/2016 echo Study Conclusions  - Left ventricle: The cavity size was normal. Wall thickness was   normal. Systolic function was vigorous. The estimated ejection   fraction was in the range of 75% to 80%. Wall motion was normal;   there were no regional wall motion abnormalities. The study is   not technically sufficient to allow evaluation of LV diastolic   function. - Mitral valve: There was trivial regurgitation. - Right atrium: Central venous pressure (est): 3 mm Hg. - Tricuspid valve: There was trivial regurgitation. - Pulmonary arteries: PA peak pressure: 18 mm Hg (S). - Pericardium, extracardiac: There was no pericardial effusion.  Impressions:  - Normal LV wall thickness with LVEF 75-80%. Indeterminate   diastolic  function. Normal left atrial chamber size with trivial   mitral regurgitation. Trivial tricuspid regurgitation with normal   estimated PASP.   09/2016 MPI  The study is normal.  This is a low risk study.  The left ventricular ejection fraction is hyperdynamic (>65%).  There was no ST segment deviation noted during stress.   Normal resting and stress perfusion. No ischemia or infarction EF 84%    Assessment and Plan   1. CAD  - recent negative stress test. Occasional atypical chest pain - continue current meds   2. HTN  - bp's above goal given her DM2. She will submit bp log, if above 130/80 on average will need medication titration. Consider adding norvasc.    3. Hyperlipidemia  - continue high dose statin, request labs from pcp  4. DM2 - not at goal, pcp working on medication changes   F/u 6 months        Antoine Poche, M.D.

## 2017-08-27 ENCOUNTER — Ambulatory Visit: Payer: BLUE CROSS/BLUE SHIELD | Admitting: Cardiology

## 2017-08-27 ENCOUNTER — Encounter: Payer: Self-pay | Admitting: Cardiology

## 2017-08-27 NOTE — Progress Notes (Deleted)
Clinical Summary Ms. Eckholm is a 71 y.o.female  seen today for follow up of the following medical problems.   1. 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 LVEF was normal at that time by cath LV gram.  - has had atypical chest pain for quite some time. Sharp pain that lasts just a few seconds, worst with palpation. No SOB, no DOE - echo 09/2016 LVEF 75-80%, no WMAs, cannot eval diastolic function. - 09/2016 Lexiscan MPI: no ischemia.    - some SOB she relates to recent bronchitis. Occasional chest pain at times, sporadic. Typically right sided. Overall stable x 2 years.  - compliant with meds  2. Hyperlipidemia  - compliant with statin therapy   3. HTN  -compliant with meds - checks at bp at home, 140s/60s.   4. DM2 - last 06/2016 Hgb A1c 13.  - followed by pcp    SH: works at [employer].   Past Medical History:  Diagnosis Date  . Depression   . GERD (gastroesophageal reflux disease)   . Other and unspecified hyperlipidemia   . Other specified cardiac dysrhythmias(427.89)   . Other specified cardiac dysrhythmias(427.89)   . Postinflammatory pulmonary fibrosis (HCC)   . Postsurgical aortocoronary bypass status   . Pure hypercholesterolemia   . 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 Type II or unspecified type diabetes mellitus without mention of complication, not stated as uncontrolled   . Unspecified essential hypertension   . Unspecified essential hypertension      No Known Allergies   Current Outpatient Prescriptions  Medication Sig Dispense Refill  . ALPRAZolam (XANAX) 0.5 MG tablet Take 0.5 mg by mouth as needed for sleep.     Marland Kitchen amLODipine (NORVASC) 5 MG tablet Take 5 mg by mouth daily.    Marland Kitchen aspirin 81 MG tablet Take 81 mg by mouth daily.      Marland Kitchen atorvastatin (LIPITOR) 40 MG tablet Take 1 tablet by mouth daily.  0  .  chlorthalidone (HYGROTON) 25 MG tablet Take 25 mg by mouth daily.    Marland Kitchen glimepiride (AMARYL) 4 MG tablet Take 4 mg by mouth daily before breakfast.      . insulin aspart (NOVOLOG) 100 UNIT/ML FlexPen Inject 15 Units into the skin 3 (three) times daily with meals.     . insulin glargine (LANTUS) 100 UNIT/ML injection Inject 40 Units into the skin at bedtime.     Marland Kitchen lisinopril (PRINIVIL,ZESTRIL) 40 MG tablet TAKE ONE TABLET BY MOUTH ONCE DAILY NEEDS  OFFICE  VISIT 30 tablet 0  . lisinopril (PRINIVIL,ZESTRIL) 40 MG tablet TAKE ONE TABLET BY MOUTH ONCE DAILY NEEDS  OFFICE  VISIT 30 tablet 0  . metFORMIN (GLUCOPHAGE) 1000 MG tablet Take 1,000 mg by mouth 2 (two) times daily with a meal.    . nitroGLYCERIN (NITROSTAT) 0.4 MG SL tablet Place 0.4 mg under the tongue every 5 (five) minutes as needed.      Marland Kitchen omeprazole (PRILOSEC) 20 MG capsule Take 20 mg by mouth daily.    . traMADol (ULTRAM) 50 MG tablet Take 1 tablet by mouth as needed.      No current facility-administered medications for this visit.      Past Surgical History:  Procedure Laterality Date  . CORONARY ARTERY BYPASS GRAFT    05/29/2007      No Known Allergies  Family History  Problem Relation Age of Onset  . Cancer Other        Family Hx of Cancer  . Coronary artery disease Other        Family Hx of CAD     Social History Ms. Fleisher reports that she quit smoking about 15 years ago. Her smoking use included Cigarettes. She has a 34.00 pack-year smoking history. She has never used smokeless tobacco. Ms. Dauenhauer reports that she does not drink alcohol.   Review of Systems CONSTITUTIONAL: No weight loss, fever, chills, weakness or fatigue.  HEENT: Eyes: No visual loss, blurred vision, double vision or yellow sclerae.No hearing loss, sneezing, congestion, runny nose or sore throat.  SKIN: No rash or itching.  CARDIOVASCULAR:  RESPIRATORY: No shortness of breath, cough or sputum.  GASTROINTESTINAL: No anorexia,  nausea, vomiting or diarrhea. No abdominal pain or blood.  GENITOURINARY: No burning on urination, no polyuria NEUROLOGICAL: No headache, dizziness, syncope, paralysis, ataxia, numbness or tingling in the extremities. No change in bowel or bladder control.  MUSCULOSKELETAL: No muscle, back pain, joint pain or stiffness.  LYMPHATICS: No enlarged nodes. No history of splenectomy.  PSYCHIATRIC: No history of depression or anxiety.  ENDOCRINOLOGIC: No reports of sweating, cold or heat intolerance. No polyuria or polydipsia.  Marland Kitchen   Physical Examination There were no vitals filed for this visit. There were no vitals filed for this visit.  Gen: resting comfortably, no acute distress HEENT: no scleral icterus, pupils equal round and reactive, no palptable cervical adenopathy,  CV Resp: Clear to auscultation bilaterally GI: abdomen is soft, non-tender, non-distended, normal bowel sounds, no hepatosplenomegaly MSK: extremities are warm, no edema.  Skin: warm, no rash Neuro:  no focal deficits Psych: appropriate affect   Diagnostic Studies Cath 04/2007 FINDINGS: Aortic pressure 115/52 with a mean of 76. Left ventricular  pressure 122/10.  CORONARY ANGIOGRAPHY: Left main is angiographically normal in its  proximal and mid portion. It tapers distally and there is a 50-60%  stenosis in the distal left main.  The LAD is a large-caliber vessel in its proximal portion. The LAD only  extends down to the mid anterior wall. There is a brick. There is a  diagonal Eveleigh Crumpler in the territory of a second diagonal that is the  dominant vessel. There are two septal perforators. The ostium of the  LAD has an 80% stenosis involving the true ostium. It appears very  focal and is difficult to lay out and to lay out by angiography, but  there is clearly a high-grade stenosis there.  The ramus intermedius has 40% stenosis in the mid portion. It is a  medium caliber vessel. The left circumflex has no  significant  angiographic disease. It provides a second OM Hector Taft site that supplies  a second and third OM Baden Betsch. The AV groove circumflex is small.  The right coronary artery is diffusely diseased. Proximal portion of  the vessel has luminal irregularities mid portion has a 40 at the site  30-40% stenosis. The vessel branches into a PDA and also provides two  PL branches. The PDA has a 50% stenosis in its mid portion. The first  PL Rogina Schiano also has a 50% stenosis.  Left ventricular function assessed by 30-degree RAO left  ventriculography is hyperdynamic. There are no regional wall motion  abnormalities. The LVEF is 75%.  ASSESSMENT:  1. Moderate distal left main stenosis.  2. Severe ostial left anterior descending stenosis.  3. Nonobstructive right coronary artery stenosis.  4. Normal to hyperdynamic left ventricular function.  Ms. Marmo has disease involving the distal left main and ostial left  anterior descending. I think she requires surgical consultation for  coronary bypass. If she has any resting symptoms, we will restart her  heparin.  09/2016 echo Study Conclusions  - Left ventricle: The cavity size was normal. Wall thickness was normal. Systolic function was vigorous. The estimated ejection fraction was in the range of 75% to 80%. Wall motion was normal; there were no regional wall motion abnormalities. The study is not technically sufficient to allow evaluation of LV diastolic function. - Mitral valve: There was trivial regurgitation. - Right atrium: Central venous pressure (est): 3 mm Hg. - Tricuspid valve: There was trivial regurgitation. - Pulmonary arteries: PA peak pressure: 18 mm Hg (S). - Pericardium, extracardiac: There was no pericardial effusion.  Impressions:  - Normal LV wall thickness with LVEF 75-80%. Indeterminate diastolic function. Normal left atrial chamber size with trivial mitral regurgitation. Trivial  tricuspid regurgitation with normal estimated PASP.   09/2016 MPI  The study is normal.  This is a low risk study.  The left ventricular ejection fraction is hyperdynamic (>65%).  There was no ST segment deviation noted during stress.  Normal resting and stress perfusion. No ischemia or infarction EF 84%     Assessment and Plan   1. CAD  - recent negative stress test. Occasional atypical chest pain - continue current meds   2. HTN  - bp's above goal given her DM2. She will submit bp log, if above 130/80 on average will need medication titration. Consider adding norvasc.    3. Hyperlipidemia  - continue high dose statin, request labs from pcp  4. DM2 - not at goal, pcp working on medication changes   F/u 6 months       Antoine Poche, M.D., F.A.C.C.

## 2017-10-20 IMAGING — NM NM MYOCAR MULTI W/SPECT W/WALL MOTION & EF
2 series · 12 of 12 positions shown · non-contrast
Comparison: none

[Series 1: rest · 8.28mm/px · 6 of 64 frames shown]
[frame 6/64]
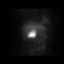
[frame 16/64]
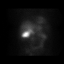
[frame 27/64]
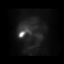
[frame 38/64]
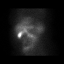
[frame 48/64]
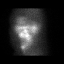
[frame 59/64]
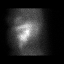

[Series 2: stress gated · 8.28mm/px · 6 of 64 frames shown]
[frame 6/64]
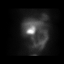
[frame 16/64]
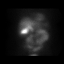
[frame 27/64]
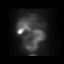
[frame 38/64]
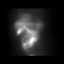
[frame 48/64]
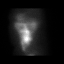
[frame 59/64]
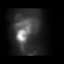

[12 of 12 positions shown; findings below may reference images not displayed]

Canned report from images found in remote index.

Refer to host system for actual result text.

## 2017-12-03 ENCOUNTER — Ambulatory Visit (INDEPENDENT_AMBULATORY_CARE_PROVIDER_SITE_OTHER): Payer: Commercial Managed Care - PPO | Admitting: Cardiology

## 2017-12-03 ENCOUNTER — Other Ambulatory Visit: Payer: Self-pay

## 2017-12-03 ENCOUNTER — Encounter: Payer: Self-pay | Admitting: *Deleted

## 2017-12-03 ENCOUNTER — Encounter: Payer: Self-pay | Admitting: Cardiology

## 2017-12-03 VITALS — BP 148/80 | HR 66 | Ht 60.0 in | Wt 178.0 lb

## 2017-12-03 DIAGNOSIS — I2510 Atherosclerotic heart disease of native coronary artery without angina pectoris: Secondary | ICD-10-CM

## 2017-12-03 DIAGNOSIS — E782 Mixed hyperlipidemia: Secondary | ICD-10-CM | POA: Diagnosis not present

## 2017-12-03 DIAGNOSIS — I10 Essential (primary) hypertension: Secondary | ICD-10-CM | POA: Diagnosis not present

## 2017-12-03 NOTE — Patient Instructions (Signed)

## 2017-12-03 NOTE — Progress Notes (Signed)
Clinical Summary [name] is a 72 y.o.female seen today for follow up of the following medical problems.   1. 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 LVEF was normal at that time by cath LV gram.  - has had atypical chest pain for quite some time. Sharp pain that lasts just a few seconds, worst with palpation. No SOB, no DOE - echo 09/2016 LVEF 75-80%, no WMAs, cannot eval diastolic function. - 09/2016 Lexiscan MPI: no ischemia.    - still with chest pain at times, unchanged in character. Less frequent since last visit - compliant with meds.   2. Hyperlipidemia  -she is compliant with meds   3. HTN  -compliant with meds - home bps around 120s/60s  4. Dizziness - mainly occurs with walking or turning quickly. Never occurs laying flat.  - can occur with standing.  - water bottles x <1 per day, no coffee, no tea, regular cokes small cans x 2, OJ x 1 glass, no alcohol.  - uncontrolled diabetes also risk for increased diuresis due to blood sugars, autonomic dysfunction.   SH: works at [employer].   Past Medical History:  Diagnosis Date  . Depression   . GERD (gastroesophageal reflux disease)   . Other and unspecified hyperlipidemia   . Other specified cardiac dysrhythmias(427.89)   . Other specified cardiac dysrhythmias(427.89)   . Postinflammatory pulmonary fibrosis (HCC)   . Postsurgical aortocoronary bypass status   . Pure hypercholesterolemia   . 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 Type II or unspecified type diabetes mellitus without mention of complication, not stated as uncontrolled   . Unspecified essential hypertension   . Unspecified essential hypertension      No Known Allergies   Current Outpatient Medications  Medication Sig Dispense Refill  . ALPRAZolam (XANAX) 0.5 MG tablet Take 0.5 mg by mouth as needed for sleep.      Marland Kitchen amLODipine (NORVASC) 5 MG tablet Take 5 mg by mouth daily.    Marland Kitchen aspirin 81 MG tablet Take 81 mg by mouth daily.      Marland Kitchen atorvastatin (LIPITOR) 40 MG tablet Take 1 tablet by mouth daily.  0  . chlorthalidone (HYGROTON) 25 MG tablet Take 25 mg by mouth daily.    Marland Kitchen glimepiride (AMARYL) 4 MG tablet Take 4 mg by mouth daily before breakfast.      . insulin aspart (NOVOLOG) 100 UNIT/ML FlexPen Inject 15 Units into the skin 3 (three) times daily with meals.     . insulin glargine (LANTUS) 100 UNIT/ML injection Inject 40 Units into the skin at bedtime.     Marland Kitchen lisinopril (PRINIVIL,ZESTRIL) 40 MG tablet TAKE ONE TABLET BY MOUTH ONCE DAILY NEEDS  OFFICE  VISIT 30 tablet 0  . lisinopril (PRINIVIL,ZESTRIL) 40 MG tablet TAKE ONE TABLET BY MOUTH ONCE DAILY NEEDS  OFFICE  VISIT 30 tablet 0  . metFORMIN (GLUCOPHAGE) 1000 MG tablet Take 1,000 mg by mouth 2 (two) times daily with a meal.    . nitroGLYCERIN (NITROSTAT) 0.4 MG SL tablet Place 0.4 mg under the tongue every 5 (five) minutes as needed.      Marland Kitchen omeprazole (PRILOSEC) 20 MG capsule Take 20 mg by mouth daily.    . traMADol (ULTRAM) 50 MG tablet Take 1 tablet by mouth as needed.      No current facility-administered  medications for this visit.         No Known Allergies    Family History  Problem Relation Age of Onset  . Cancer Other        Family Hx of Cancer  . Coronary artery disease Other        Family Hx of CAD     Social History [name] reports that she quit smoking about 16 years ago. Her smoking use included cigarettes. She has a 34.00 pack-year smoking history. she has never used smokeless tobacco. [name] reports that she does not drink alcohol.   Review of Systems CONSTITUTIONAL: No weight loss, fever, chills, weakness or fatigue.  HEENT: Eyes: No visual loss, blurred vision, double vision or yellow sclerae.No hearing loss, sneezing, congestion, runny nose or sore throat.  SKIN: No rash or itching.    CARDIOVASCULAR: per hpi RESPIRATORY: No shortness of breath, cough or sputum.  GASTROINTESTINAL: No anorexia, nausea, vomiting or diarrhea. No abdominal pain or blood.  GENITOURINARY: No burning on urination, no polyuria NEUROLOGICAL: +dizziness.  MUSCULOSKELETAL: No muscle, back pain, joint pain or stiffness.  LYMPHATICS: No enlarged nodes. No history of splenectomy.  PSYCHIATRIC: No history of depression or anxiety.  ENDOCRINOLOGIC: No reports of sweating, cold or heat intolerance. No polyuria or polydipsia.  Marland Kitchen   Physical Examination Vitals:   12/03/17 1404  BP: (!) 148/80  Pulse: 66  SpO2: 95%   Vitals:   12/03/17 1404  Weight: 178 lb (80.7 kg)  Height: 5' (1.524 m)    Gen: resting comfortably, no acute distress HEENT: no scleral icterus, pupils equal round and reactive, no palptable cervical adenopathy,  CV: RRR, no m/rg, no jvd Resp: Clear to auscultation bilaterally GI: abdomen is soft, non-tender, non-distended, normal bowel sounds, no hepatosplenomegaly MSK: extremities are warm, no edema.  Skin: warm, no rash Neuro:  no focal deficits Psych: appropriate affect   Diagnostic Studies  Cath 04/2007 FINDINGS: Aortic pressure 115/52 with a mean of 76. Left ventricular  pressure 122/10.  CORONARY ANGIOGRAPHY: Left main is angiographically normal in its  proximal and mid portion. It tapers distally and there is a 50-60%  stenosis in the distal left main.  The LAD is a large-caliber vessel in its proximal portion. The LAD only  extends down to the mid anterior wall. There is a brick. There is a  diagonal Roselyn Doby in the territory of a second diagonal that is the  dominant vessel. There are two septal perforators. The ostium of the  LAD has an 80% stenosis involving the true ostium. It appears very  focal and is difficult to lay out and to lay out by angiography, but  there is clearly a high-grade stenosis there.  The ramus intermedius has 40% stenosis  in the mid portion. It is a  medium caliber vessel. The left circumflex has no significant  angiographic disease. It provides a second OM Laurine Kuyper site that supplies  a second and third OM Ryka Beighley. The AV groove circumflex is small.  The right coronary artery is diffusely diseased. Proximal portion of  the vessel has luminal irregularities mid portion has a 40 at the site  30-40% stenosis. The vessel branches into a PDA and also provides two  PL branches. The PDA has a 50% stenosis in its mid portion. The first  PL Mykel Sponaugle also has a 50% stenosis.  Left ventricular function assessed by 30-degree RAO left  ventriculography is hyperdynamic. There are no regional wall motion  abnormalities. The LVEF  is 75%.  ASSESSMENT:  1. Moderate distal left main stenosis.  2. Severe ostial left anterior descending stenosis.  3. Nonobstructive right coronary artery stenosis.  4. Normal to hyperdynamic left ventricular function.  [name] has disease involving the distal left main and ostial left  anterior descending. I think she requires surgical consultation for  coronary bypass. If she has any resting symptoms, we will restart her  heparin.  09/2016 echo Study Conclusions  - Left ventricle: The cavity size was normal. Wall thickness was normal. Systolic function was vigorous. The estimated ejection fraction was in the range of 75% to 80%. Wall motion was normal; there were no regional wall motion abnormalities. The study is not technically sufficient to allow evaluation of LV diastolic function. - Mitral valve: There was trivial regurgitation. - Right atrium: Central venous pressure (est): 3 mm Hg. - Tricuspid valve: There was trivial regurgitation. - Pulmonary arteries: PA peak pressure: 18 mm Hg (S). - Pericardium, extracardiac: There was no pericardial effusion.  Impressions:  - Normal LV wall thickness with LVEF 75-80%. Indeterminate diastolic  function. Normal left atrial chamber size with trivial mitral regurgitation. Trivial tricuspid regurgitation with normal estimated PASP.   09/2016 MPI  The study is normal.  This is a low risk study.  The left ventricular ejection fraction is hyperdynamic (>65%).  There was no ST segment deviation noted during stress.  Normal resting and stress perfusion. No ischemia or infarction EF 84%    Assessment and Plan  1. CAD  - atypical chest pain, previous stress test without ischemia. - continue to monitor at this time.    2. HTN  - elevated in clinic, home numbers are at goal - continue current meds  3. Hyperlipidemia  - continue statin, request pcp labs     F/u 6 months       Antoine Poche, M.D., F.A.C.C.

## 2017-12-05 ENCOUNTER — Encounter: Payer: Self-pay | Admitting: Cardiology

## 2018-06-18 ENCOUNTER — Encounter: Payer: Self-pay | Admitting: Cardiology

## 2018-06-18 ENCOUNTER — Other Ambulatory Visit: Payer: Self-pay

## 2018-06-18 ENCOUNTER — Ambulatory Visit (INDEPENDENT_AMBULATORY_CARE_PROVIDER_SITE_OTHER): Payer: Commercial Managed Care - PPO | Admitting: Cardiology

## 2018-06-18 VITALS — BP 152/74 | HR 58 | Ht 60.0 in | Wt 188.0 lb

## 2018-06-18 DIAGNOSIS — I2510 Atherosclerotic heart disease of native coronary artery without angina pectoris: Secondary | ICD-10-CM

## 2018-06-18 DIAGNOSIS — E782 Mixed hyperlipidemia: Secondary | ICD-10-CM | POA: Diagnosis not present

## 2018-06-18 DIAGNOSIS — I10 Essential (primary) hypertension: Secondary | ICD-10-CM | POA: Diagnosis not present

## 2018-06-18 NOTE — Patient Instructions (Signed)

## 2018-06-18 NOTE — Progress Notes (Signed)
Clinical Summary [name] is a 72 y.o.female seen today for follow up of the following medical problems.     1. 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 LVEF was normal at that time by cath LV gram.  - has had atypical chest pain for quite some time. Sharp pain that lasts just a few seconds, worst with palpation. No SOB, no DOE - echo 09/2016 LVEF 75-80%, no WMAs, cannot eval diastolic function. - 09/2016 Lexiscan MPI: no ischemia.    - chronic pain unchanged. No recent SOb/DOE - compliant with meds   2. Hyperlipidemia  -compliant with meds   3. HTN   - home bp's 130s/60s. She is compliant with meds  4. Dizziness - mainly occurs with walking or turning quickly. Never occurs laying flat.  - can occur with standing.  - water bottles x <1 per day, no coffee, no tea, regular cokes small cans x 2, OJ x 1 glass, no alcohol.  - uncontrolled diabetes also risk for increased diuresis due to blood sugars, autonomic dysfunction.   - denies any recent symptoms.      SH: works at [employer].       Past Medical History:  Diagnosis Date  . Depression   . GERD (gastroesophageal reflux disease)   . Other and unspecified hyperlipidemia   . Other specified cardiac dysrhythmias(427.89)   . Other specified cardiac dysrhythmias(427.89)   . Postinflammatory pulmonary fibrosis (HCC)   . Postsurgical aortocoronary bypass status   . Pure hypercholesterolemia   . 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 Type II or unspecified type diabetes mellitus without mention of complication, not stated as uncontrolled   . Unspecified essential hypertension   . Unspecified essential hypertension      No Known Allergies   Current Outpatient Medications  Medication Sig Dispense Refill  . ALPRAZolam (XANAX) 0.5 MG tablet Take 0.5 mg by mouth as needed for  sleep.     Marland Kitchen amLODipine (NORVASC) 5 MG tablet Take 5 mg by mouth daily.    Marland Kitchen aspirin 81 MG tablet Take 81 mg by mouth daily.      Marland Kitchen atorvastatin (LIPITOR) 40 MG tablet Take 1 tablet by mouth daily.  0  . chlorthalidone (HYGROTON) 25 MG tablet Take 25 mg by mouth daily.    Marland Kitchen glimepiride (AMARYL) 4 MG tablet Take 4 mg by mouth daily before breakfast.      . insulin aspart (NOVOLOG) 100 UNIT/ML FlexPen Inject 15 Units into the skin 3 (three) times daily with meals.     . insulin glargine (LANTUS) 100 UNIT/ML injection Inject 40 Units into the skin at bedtime.     Marland Kitchen lisinopril (PRINIVIL,ZESTRIL) 40 MG tablet TAKE ONE TABLET BY MOUTH ONCE DAILY NEEDS  OFFICE  VISIT 30 tablet 0  . metFORMIN (GLUCOPHAGE) 1000 MG tablet Take 1,000 mg by mouth 2 (two) times daily with a meal.    . nitroGLYCERIN (NITROSTAT) 0.4 MG SL tablet Place 0.4 mg under the tongue every 5 (five) minutes as needed.      Marland Kitchen omeprazole (PRILOSEC) 20 MG capsule Take 20 mg by mouth daily.    . traMADol (ULTRAM) 50 MG tablet Take 1 tablet by mouth as needed.      No current facility-administered medications for this visit.      Past Surgical History:  Procedure  Laterality Date  . CORONARY ARTERY BYPASS GRAFT    05/29/2007      No Known Allergies    Family History  Problem Relation Age of Onset  . Cancer Other        Family Hx of Cancer  . Coronary artery disease Other        Family Hx of CAD     Social History [name] reports that she quit smoking about 16 years ago. Her smoking use included cigarettes. She has a 34.00 pack-year smoking history. She has never used smokeless tobacco. [name] reports that she does not drink alcohol.   Review of Systems CONSTITUTIONAL: No weight loss, fever, chills, weakness or fatigue.  HEENT: Eyes: No visual loss, blurred vision, double vision or yellow sclerae.No hearing loss, sneezing, congestion, runny nose or sore throat.  SKIN: No rash or itching.  CARDIOVASCULAR:  per hpi RESPIRATORY: No shortness of breath, cough or sputum.  GASTROINTESTINAL: No anorexia, nausea, vomiting or diarrhea. No abdominal pain or blood.  GENITOURINARY: No burning on urination, no polyuria NEUROLOGICAL: No headache, dizziness, syncope, paralysis, ataxia, numbness or tingling in the extremities. No change in bowel or bladder control.  MUSCULOSKELETAL: No muscle, back pain, joint pain or stiffness.  LYMPHATICS: No enlarged nodes. No history of splenectomy.  PSYCHIATRIC: No history of depression or anxiety.  ENDOCRINOLOGIC: No reports of sweating, cold or heat intolerance. No polyuria or polydipsia.  Marland Kitchen   Physical Examination Vitals:   06/18/18 1251  BP: (!) 152/74  Pulse: (!) 58  SpO2: 99%   Vitals:   06/18/18 1251  Weight: 188 lb (85.3 kg)  Height: 5' (1.524 m)    Gen: resting comfortably, no acute distress HEENT: no scleral icterus, pupils equal round and reactive, no palptable cervical adenopathy,  CV: RRR, no m/r/g, no jvd Resp: Clear to auscultation bilaterally GI: abdomen is soft, non-tender, non-distended, normal bowel sounds, no hepatosplenomegaly MSK: extremities are warm, no edema.  Skin: warm, no rash Neuro:  no focal deficits Psych: appropriate affect   Diagnostic Studies Cath 04/2007 FINDINGS: Aortic pressure 115/52 with a mean of 76. Left ventricular  pressure 122/10.  CORONARY ANGIOGRAPHY: Left main is angiographically normal in its  proximal and mid portion. It tapers distally and there is a 50-60%  stenosis in the distal left main.  The LAD is a large-caliber vessel in its proximal portion. The LAD only  extends down to the mid anterior wall. There is a brick. There is a  diagonal Dejai Schubach in the territory of a second diagonal that is the  dominant vessel. There are two septal perforators. The ostium of the  LAD has an 80% stenosis involving the true ostium. It appears very  focal and is difficult to lay out and to lay out by  angiography, but  there is clearly a high-grade stenosis there.  The ramus intermedius has 40% stenosis in the mid portion. It is a  medium caliber vessel. The left circumflex has no significant  angiographic disease. It provides a second OM Kyl Givler site that supplies  a second and third OM Tyquon Near. The AV groove circumflex is small.  The right coronary artery is diffusely diseased. Proximal portion of  the vessel has luminal irregularities mid portion has a 40 at the site  30-40% stenosis. The vessel branches into a PDA and also provides two  PL branches. The PDA has a 50% stenosis in its mid portion. The first  PL Yuvan Medinger also has a 50% stenosis.  Left ventricular function assessed by 30-degree RAO left  ventriculography is hyperdynamic. There are no regional wall motion  abnormalities. The LVEF is 75%.  ASSESSMENT:  1. Moderate distal left main stenosis.  2. Severe ostial left anterior descending stenosis.  3. Nonobstructive right coronary artery stenosis.  4. Normal to hyperdynamic left ventricular function.  [name] has disease involving the distal left main and ostial left  anterior descending. I think she requires surgical consultation for  coronary bypass. If she has any resting symptoms, we will restart her  heparin.  09/2016 echo Study Conclusions  - Left ventricle: The cavity size was normal. Wall thickness was normal. Systolic function was vigorous. The estimated ejection fraction was in the range of 75% to 80%. Wall motion was normal; there were no regional wall motion abnormalities. The study is not technically sufficient to allow evaluation of LV diastolic function. - Mitral valve: There was trivial regurgitation. - Right atrium: Central venous pressure (est): 3 mm Hg. - Tricuspid valve: There was trivial regurgitation. - Pulmonary arteries: PA peak pressure: 18 mm Hg (S). - Pericardium, extracardiac: There was no pericardial  effusion.  Impressions:  - Normal LV wall thickness with LVEF 75-80%. Indeterminate diastolic function. Normal left atrial chamber size with trivial mitral regurgitation. Trivial tricuspid regurgitation with normal estimated PASP.   09/2016 MPI  The study is normal.  This is a low risk study.  The left ventricular ejection fraction is hyperdynamic (>65%).  There was no ST segment deviation noted during stress.  Normal resting and stress perfusion. No ischemia or infarction EF 84%    Assessment and Plan   1. CAD  - atypical chest pain, previous stress test without ischemia. -chronic aytpical symptoms unchanged, continue current medical therapy.    2. HTN  - elevated in clinic, home numbers are at goal - continue current meds  3. Hyperlipidemia  - request pcp labs, continue statin.        Antoine Poche, M.D.

## 2018-06-28 ENCOUNTER — Encounter: Payer: Self-pay | Admitting: Cardiology

## 2018-12-11 ENCOUNTER — Encounter: Payer: Self-pay | Admitting: *Deleted

## 2018-12-12 ENCOUNTER — Encounter: Payer: Self-pay | Admitting: Cardiology

## 2018-12-12 ENCOUNTER — Ambulatory Visit (INDEPENDENT_AMBULATORY_CARE_PROVIDER_SITE_OTHER): Payer: Commercial Managed Care - PPO | Admitting: Cardiology

## 2018-12-12 VITALS — BP 123/67 | HR 65 | Ht 60.0 in | Wt 187.4 lb

## 2018-12-12 DIAGNOSIS — I10 Essential (primary) hypertension: Secondary | ICD-10-CM | POA: Diagnosis not present

## 2018-12-12 DIAGNOSIS — E782 Mixed hyperlipidemia: Secondary | ICD-10-CM

## 2018-12-12 DIAGNOSIS — I2510 Atherosclerotic heart disease of native coronary artery without angina pectoris: Secondary | ICD-10-CM

## 2018-12-12 NOTE — Patient Instructions (Signed)

## 2018-12-12 NOTE — Progress Notes (Signed)
Clinical Summary Ms. Digiacomo is a 73 y.o.female seen today for follow up of the following medical problems.     1. 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 LVEF was normal at that time by cath LV gram.  - has had atypical chest pain for quite some time. Sharp pain that lasts just a few seconds, worst with palpation. No SOB, no DOE - echo 09/2016 LVEF 75-80%, no WMAs, cannot eval diastolic function. - 09/2016 Lexiscan MPI: no ischemia.  - denies any recent chest pain   2. Hyperlipidemia  -labs followed by pcp - compliant with statin   3. HTN  - compliant with meds   4. Abdominal pain - admit to [facility] Jan 2020 with abdominal pain, N/V/D - unclear why but troponins checked. 0.06-->neg. WBC in the 20s. CXR no acute process. No cardiac symptoms      SH: works at [employer].   Past Medical History:  Diagnosis Date  . Depression   . GERD (gastroesophageal reflux disease)   . Other and unspecified hyperlipidemia   . Other specified cardiac dysrhythmias(427.89)   . Other specified cardiac dysrhythmias(427.89)   . Postinflammatory pulmonary fibrosis (HCC)   . Postsurgical aortocoronary bypass status   . Pure hypercholesterolemia   . 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 Type II or unspecified type diabetes mellitus without mention of complication, not stated as uncontrolled   . Unspecified essential hypertension   . Unspecified essential hypertension      No Known Allergies   Current Outpatient Medications  Medication Sig Dispense Refill  . amLODipine (NORVASC) 5 MG tablet Take 5 mg by mouth daily.    Marland Kitchen aspirin 81 MG tablet Take 81 mg by mouth daily.      Marland Kitchen atorvastatin (LIPITOR) 40 MG tablet Take 1 tablet by mouth daily.  0  . chlorthalidone (HYGROTON) 25 MG tablet Take 25 mg by mouth daily.    Marland Kitchen glimepiride (AMARYL) 4 MG  tablet Take 4 mg by mouth daily before breakfast.      . insulin aspart (NOVOLOG) 100 UNIT/ML FlexPen Inject 15 Units into the skin 3 (three) times daily with meals.     . insulin glargine (LANTUS) 100 UNIT/ML injection Inject 40 Units into the skin at bedtime.     Marland Kitchen lisinopril (PRINIVIL,ZESTRIL) 40 MG tablet TAKE ONE TABLET BY MOUTH ONCE DAILY NEEDS  OFFICE  VISIT 30 tablet 0  . metFORMIN (GLUCOPHAGE) 1000 MG tablet Take 1,000 mg by mouth 2 (two) times daily with a meal.    . nitroGLYCERIN (NITROSTAT) 0.4 MG SL tablet Place 0.4 mg under the tongue every 5 (five) minutes as needed.      Marland Kitchen omeprazole (PRILOSEC) 20 MG capsule Take 20 mg by mouth daily.    . traMADol (ULTRAM) 50 MG tablet Take 1 tablet by mouth as needed.      No current facility-administered medications for this visit.      Past Surgical History:  Procedure Laterality Date  . CORONARY ARTERY BYPASS GRAFT    05/29/2007      No Known Allergies    Family History  Problem Relation Age of Onset  . Cancer Other        Family Hx of Cancer  . Coronary artery disease Other        Family Hx of CAD  Social History Ms. Amie CritchleyBlackwell reports that she quit smoking about 17 years ago. Her smoking use included cigarettes. She has a 34.00 pack-year smoking history. She has never used smokeless tobacco. Ms. Amie CritchleyBlackwell reports no history of alcohol use.   Review of Systems CONSTITUTIONAL: No weight loss, fever, chills, weakness or fatigue.  HEENT: Eyes: No visual loss, blurred vision, double vision or yellow sclerae.No hearing loss, sneezing, congestion, runny nose or sore throat.  SKIN: No rash or itching.  CARDIOVASCULAR: per hpi RESPIRATORY: No shortness of breath, cough or sputum.  GASTROINTESTINAL: No anorexia, nausea, vomiting or diarrhea. No abdominal pain or blood.  GENITOURINARY: No burning on urination, no polyuria NEUROLOGICAL: No headache, dizziness, syncope, paralysis, ataxia, numbness or tingling in the  extremities. No change in bowel or bladder control.  MUSCULOSKELETAL: No muscle, back pain, joint pain or stiffness.  LYMPHATICS: No enlarged nodes. No history of splenectomy.  PSYCHIATRIC: No history of depression or anxiety.  ENDOCRINOLOGIC: No reports of sweating, cold or heat intolerance. No polyuria or polydipsia.  Marland Kitchen.   Physical Examination Today's Vitals   12/12/18 1254  BP: 123/67  Pulse: 65  SpO2: 95%  Weight: 187 lb 6.4 oz (85 kg)  Height: 5' (1.524 m)   Body mass index is 36.6 kg/m. Vitals:   12/12/18 1254  Weight: 187 lb 6.4 oz (85 kg)  Height: 5' (1.524 m)    Gen: resting comfortably, no acute distress HEENT: no scleral icterus, pupils equal round and reactive, no palptable cervical adenopathy,  CV: RRR, no m/r/g, no jvd Resp: Clear to auscultation bilaterally GI: abdomen is soft, non-tender, non-distended, normal bowel sounds, no hepatosplenomegaly MSK: extremities are warm, no edema.  Skin: warm, no rash Neuro:  no focal deficits Psych: appropriate affect   Diagnostic Studies Cath 04/2007 FINDINGS: Aortic pressure 115/52 with a mean of 76. Left ventricular  pressure 122/10.  CORONARY ANGIOGRAPHY: Left main is angiographically normal in its  proximal and mid portion. It tapers distally and there is a 50-60%  stenosis in the distal left main.  The LAD is a large-caliber vessel in its proximal portion. The LAD only  extends down to the mid anterior wall. There is a brick. There is a  diagonal Stuti Sandin in the territory of a second diagonal that is the  dominant vessel. There are two septal perforators. The ostium of the  LAD has an 80% stenosis involving the true ostium. It appears very  focal and is difficult to lay out and to lay out by angiography, but  there is clearly a high-grade stenosis there.  The ramus intermedius has 40% stenosis in the mid portion. It is a  medium caliber vessel. The left circumflex has no significant  angiographic  disease. It provides a second OM Calvyn Kurtzman site that supplies  a second and third OM Lynett Brasil. The AV groove circumflex is small.  The right coronary artery is diffusely diseased. Proximal portion of  the vessel has luminal irregularities mid portion has a 40 at the site  30-40% stenosis. The vessel branches into a PDA and also provides two  PL branches. The PDA has a 50% stenosis in its mid portion. The first  PL Eyleen Rawlinson also has a 50% stenosis.  Left ventricular function assessed by 30-degree RAO left  ventriculography is hyperdynamic. There are no regional wall motion  abnormalities. The LVEF is 75%.  ASSESSMENT:  1. Moderate distal left main stenosis.  2. Severe ostial left anterior descending stenosis.  3. Nonobstructive right coronary artery stenosis.  4. Normal to hyperdynamic left ventricular function.  Ms. Bartholf has disease involving the distal left main and ostial left  anterior descending. I think she requires surgical consultation for  coronary bypass. If she has any resting symptoms, we will restart her  heparin.  09/2016 echo Study Conclusions  - Left ventricle: The cavity size was normal. Wall thickness was normal. Systolic function was vigorous. The estimated ejection fraction was in the range of 75% to 80%. Wall motion was normal; there were no regional wall motion abnormalities. The study is not technically sufficient to allow evaluation of LV diastolic function. - Mitral valve: There was trivial regurgitation. - Right atrium: Central venous pressure (est): 3 mm Hg. - Tricuspid valve: There was trivial regurgitation. - Pulmonary arteries: PA peak pressure: 18 mm Hg (S). - Pericardium, extracardiac: There was no pericardial effusion.  Impressions:  - Normal LV wall thickness with LVEF 75-80%. Indeterminate diastolic function. Normal left atrial chamber size with trivial mitral regurgitation. Trivial tricuspid regurgitation with  normal estimated PASP.   09/2016 MPI  The study is normal.  This is a low risk study.  The left ventricular ejection fraction is hyperdynamic (>65%).  There was no ST segment deviation noted during stress.  Normal resting and stress perfusion. No ischemia or infarction EF 84%     Assessment and Plan   1. CAD  -no recent symptoms, continue current meds - mild trop elevation of 0.06 in setting of severe gastroenteritis, WBC 20s, and no cardiac symptoms that resolved. Inapparopriate troponin check of no significance, no further workup.    2. HTN  -at goal, continue current meds  3. Hyperlipidemia  - request pcp labs, continue statin  F/u 6 months   Antoine Poche, M.D

## 2018-12-19 ENCOUNTER — Encounter: Payer: Self-pay | Admitting: *Deleted

## 2019-04-25 ENCOUNTER — Encounter: Payer: Self-pay | Admitting: Cardiology

## 2019-07-04 ENCOUNTER — Encounter: Payer: Self-pay | Admitting: Cardiology

## 2019-07-04 ENCOUNTER — Telehealth (INDEPENDENT_AMBULATORY_CARE_PROVIDER_SITE_OTHER): Payer: Commercial Managed Care - PPO | Admitting: Cardiology

## 2019-07-04 ENCOUNTER — Telehealth: Payer: Self-pay | Admitting: *Deleted

## 2019-07-04 ENCOUNTER — Telehealth: Payer: Self-pay | Admitting: Cardiology

## 2019-07-04 ENCOUNTER — Encounter: Payer: Self-pay | Admitting: *Deleted

## 2019-07-04 VITALS — BP 168/64 | HR 70 | Ht 60.0 in | Wt 195.0 lb

## 2019-07-04 DIAGNOSIS — Z951 Presence of aortocoronary bypass graft: Secondary | ICD-10-CM

## 2019-07-04 DIAGNOSIS — R0602 Shortness of breath: Secondary | ICD-10-CM

## 2019-07-04 DIAGNOSIS — I10 Essential (primary) hypertension: Secondary | ICD-10-CM

## 2019-07-04 DIAGNOSIS — I2510 Atherosclerotic heart disease of native coronary artery without angina pectoris: Secondary | ICD-10-CM

## 2019-07-04 DIAGNOSIS — E782 Mixed hyperlipidemia: Secondary | ICD-10-CM

## 2019-07-04 DIAGNOSIS — E785 Hyperlipidemia, unspecified: Secondary | ICD-10-CM

## 2019-07-04 DIAGNOSIS — Z79899 Other long term (current) drug therapy: Secondary | ICD-10-CM

## 2019-07-04 NOTE — Patient Instructions (Signed)
Medication Instructions:   Your physician recommends that you continue on your current medications as directed. Please refer to the Current Medication list given to you today.  Labwork:  NONE  Testing/Procedures: Your physician has requested that you have an echocardiogram. Echocardiography is a painless test that uses sound waves to create images of your heart. It provides your doctor with information about the size and shape of your heart and how well your heart's chambers and valves are working. This procedure takes approximately one hour. There are no restrictions for this procedure.  Follow-Up:  Your physician recommends that you schedule a follow-up appointment in: pending test result.  Any Other Special Instructions Will Be Listed Below (If Applicable).  If you need a refill on your cardiac medications before your next appointment, please call your pharmacy. 

## 2019-07-04 NOTE — Progress Notes (Signed)
Virtual Visit via Telephone Note   This visit type was conducted due to national recommendations for restrictions regarding the COVID-19 Pandemic (e.g. social distancing) in an effort to limit this patient's exposure and mitigate transmission in our community.  Due to her co-morbid illnesses, this patient is at least at moderate risk for complications without adequate follow up.  This format is felt to be most appropriate for this patient at this time.  The patient did not have access to video technology/had technical difficulties with video requiring transitioning to audio format only (telephone).  All issues noted in this document were discussed and addressed.  No physical exam could be performed with this format.  Please refer to the patient's chart for her  consent to telehealth for [facility].   Date:  07/04/2019   ID:  [name], DOB 1946-10-03, MRN [id number]  Patient Location: Home Provider Location: Office  PCP:  Toma Deiters, MD  Cardiologist:  Dina Rich, MD  Electrophysiologist:  None   Evaluation Performed:  Follow-Up Visit  Chief Complaint:  DOE  History of Present Illness:    [name] is a 73 y.o. female seen today for follow up of the following medical problems.    1. 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 LVEF was normal at that time by cath LV gram.  - has had atypical chest pain for quite some time. Sharp pain that lasts just a few seconds, worst with palpation. No SOB, no DOE - echo 09/2016 LVEF 75-80%, no WMAs, cannot eval diastolic function. - 09/2016 Lexiscan MPI: no ischemia.  - recent SOB/DOE x 2 months. Mainly occurs with activity. No recent chest pain. No cough.Some wheezing, some sinus congestion. Has had some swelling in legs which has worsened. Wearing some compression socks.  - no othopnea.    2. Hyperlipidemia  -labs followed by  pcp, compliant with statin   3. HTN  - compliant with meds, has not taken meds yet today however       SH: works at [employer].  The patient does not have symptoms concerning for COVID-19 infection (fever, chills, cough, or new shortness of breath).    Past Medical History:  Diagnosis Date  . Depression   . GERD (gastroesophageal reflux disease)   . Other and unspecified hyperlipidemia   . Other specified cardiac dysrhythmias(427.89)   . Other specified cardiac dysrhythmias(427.89)   . Postinflammatory pulmonary fibrosis (HCC)   . Postsurgical aortocoronary bypass status   . Pure hypercholesterolemia   . 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 Type II or unspecified type diabetes mellitus without mention of complication, not stated as uncontrolled   . Unspecified essential hypertension   . Unspecified essential hypertension    Past Surgical History:  Procedure Laterality Date  . CORONARY ARTERY BYPASS GRAFT    05/29/2007      Current Meds  Medication Sig  . amLODipine (NORVASC) 5 MG tablet Take 5 mg by mouth daily.  Marland Kitchen aspirin 81 MG tablet Take 81 mg by mouth daily.    Marland Kitchen atorvastatin (LIPITOR) 40 MG tablet Take 1 tablet by mouth daily.  . chlorthalidone (HYGROTON) 25 MG tablet Take 25 mg by mouth daily.  . Insulin Disposable Pump (OMNIPOD 10 PACK) MISC by Does not apply route. Pump for Humalog - doses her as needed.  Marland Kitchen lisinopril (PRINIVIL,ZESTRIL) 40  MG tablet TAKE ONE TABLET BY MOUTH ONCE DAILY NEEDS  OFFICE  VISIT  . nitroGLYCERIN (NITROSTAT) 0.4 MG SL tablet Place 0.4 mg under the tongue every 5 (five) minutes as needed.    Marland Kitchen omeprazole (PRILOSEC) 20 MG capsule Take 20 mg by mouth daily.  . traMADol (ULTRAM) 50 MG tablet Take 1 tablet by mouth as needed.      Allergies:   Patient has no known allergies.   Social History   Tobacco Use  . Smoking status: Former Smoker    Packs/day: 1.00    Years: 34.00    Pack  years: 34.00    Types: Cigarettes    Quit date: 11/27/2001    Years since quitting: 17.6  . Smokeless tobacco: Never Used  Substance Use Topics  . Alcohol use: No    Alcohol/week: 0.0 standard drinks  . Drug use: No     Family Hx: The patient's family history includes Cancer in an other family member; Coronary artery disease in an other family member.  ROS:   Please see the history of present illness.     All other systems reviewed and are negative.   Prior CV studies:   The following studies were reviewed today:  Cath 04/2007 FINDINGS: Aortic pressure 115/52 with a mean of 76. Left ventricular  pressure 122/10.  CORONARY ANGIOGRAPHY: Left main is angiographically normal in its  proximal and mid portion. It tapers distally and there is a 50-60%  stenosis in the distal left main.  The LAD is a large-caliber vessel in its proximal portion. The LAD only  extends down to the mid anterior wall. There is a brick. There is a  diagonal Fabyan Loughmiller in the territory of a second diagonal that is the  dominant vessel. There are two septal perforators. The ostium of the  LAD has an 80% stenosis involving the true ostium. It appears very  focal and is difficult to lay out and to lay out by angiography, but  there is clearly a high-grade stenosis there.  The ramus intermedius has 40% stenosis in the mid portion. It is a  medium caliber vessel. The left circumflex has no significant  angiographic disease. It provides a second OM Shomari Matusik site that supplies  a second and third OM Elmire Amrein. The AV groove circumflex is small.  The right coronary artery is diffusely diseased. Proximal portion of  the vessel has luminal irregularities mid portion has a 40 at the site  30-40% stenosis. The vessel branches into a PDA and also provides two  PL branches. The PDA has a 50% stenosis in its mid portion. The first  PL Avyon Herendeen also has a 50% stenosis.  Left ventricular function assessed by  30-degree RAO left  ventriculography is hyperdynamic. There are no regional wall motion  abnormalities. The LVEF is 75%.  ASSESSMENT:  1. Moderate distal left main stenosis.  2. Severe ostial left anterior descending stenosis.  3. Nonobstructive right coronary artery stenosis.  4. Normal to hyperdynamic left ventricular function.  Ms. Gonet has disease involving the distal left main and ostial left  anterior descending. I think she requires surgical consultation for  coronary bypass. If she has any resting symptoms, we will restart her  heparin.  09/2016 echo Study Conclusions  - Left ventricle: The cavity size was normal. Wall thickness was normal. Systolic function was vigorous. The estimated ejection fraction was in the range of 75% to 80%. Wall motion was normal; there were no regional wall motion abnormalities.  The study is not technically sufficient to allow evaluation of LV diastolic function. - Mitral valve: There was trivial regurgitation. - Right atrium: Central venous pressure (est): 3 mm Hg. - Tricuspid valve: There was trivial regurgitation. - Pulmonary arteries: PA peak pressure: 18 mm Hg (S). - Pericardium, extracardiac: There was no pericardial effusion.  Impressions:  - Normal LV wall thickness with LVEF 75-80%. Indeterminate diastolic function. Normal left atrial chamber size with trivial mitral regurgitation. Trivial tricuspid regurgitation with normal estimated PASP.   09/2016 MPI  The study is normal.  This is a low risk study.  The left ventricular ejection fraction is hyperdynamic (>65%).  There was no ST segment deviation noted during stress.  Normal resting and stress perfusion. No ischemia or infarction EF 84%   Labs/Other Tests and Data Reviewed:    EKG:  No ECG reviewed.  Recent Labs: No results found for requested labs within last 8760 hours.   Recent Lipid Panel Lab Results  Component  Value Date/Time   CHOL  05/22/2007 03:45 AM    150        ATP III CLASSIFICATION:  <200     mg/dL   Desirable  960-454200-239  mg/dL   Borderline High  >=098>=240    mg/dL   High   TRIG 119230 (H) 14/78/295606/25/2008 03:45 AM   HDL 34 (L) 05/22/2007 03:45 AM   CHOLHDL 4.4 05/22/2007 03:45 AM   LDLCALC  05/22/2007 03:45 AM    70        Total Cholesterol/HDL:CHD Risk Coronary Heart Disease Risk Table                     Men   Women  1/2 Average Risk   3.4   3.3    Wt Readings from Last 3 Encounters:  07/04/19 195 lb (88.5 kg)  12/12/18 187 lb 6.4 oz (85 kg)  06/18/18 188 lb (85.3 kg)     Objective:    Vital Signs:  BP (!) 168/64   Pulse 70   Ht 5' (1.524 m)   Wt 195 lb (88.5 kg)   BMI 38.08 kg/m    Today's Vitals   07/04/19 1038  BP: (!) 168/64  Pulse: 70  Weight: 195 lb (88.5 kg)  Height: 5' (1.524 m)   Body mass index is 38.08 kg/m.  Normal affect. Normal speech pattern and tone. Comfortable, no apparent distress. No audible signs of SOB or wheezing.   ASSESSMENT & PLAN:    1. CAD  -recent SOB/DOE of unclear etiology. No recent chest pain - obtain echo to evaluate for change in cardiac function - pending results would consider nuclear stress test   2. HTN  -elevated but has not taken meds yet, at last visit Jan 2020 she was at goal - continue current meds  3. Hyperlipidemia  - continue statin  F/u 6 months  COVID-19 Education: The signs and symptoms of COVID-19 were discussed with the patient and how to seek care for testing (follow up with PCP or arrange E-visit).  The importance of social distancing was discussed today.  Time:   Today, I have spent 22 minutes with the patient with telehealth technology discussing the above problems.     Medication Adjustments/Labs and Tests Ordered: Current medicines are reviewed at length with the patient today.  Concerns regarding medicines are outlined above.   Tests Ordered: No orders of the defined types were placed in  this encounter.   Medication Changes: No  orders of the defined types were placed in this encounter.   Follow Up:  Pending echo results, likely stress test after echo  Signed, Dina RichBranch, Ranvir Renovato, MD  07/04/2019 12:25 PM    [facility]

## 2019-07-04 NOTE — Telephone Encounter (Signed)
The patient verbally consented for a telehealth phone visit with [facility] and understands that his/her insurance company will be billed for the encounter.  Vitals & medications reviewed.  

## 2019-07-04 NOTE — Telephone Encounter (Signed)
°  Precert needed for: Echo   Location:  [facility]    Date: Jul 10, 2019

## 2019-07-09 ENCOUNTER — Telehealth: Payer: Self-pay | Admitting: Cardiology

## 2019-07-09 NOTE — Telephone Encounter (Signed)

## 2019-07-10 ENCOUNTER — Ambulatory Visit (INDEPENDENT_AMBULATORY_CARE_PROVIDER_SITE_OTHER): Payer: Commercial Managed Care - PPO

## 2019-07-10 ENCOUNTER — Other Ambulatory Visit: Payer: Self-pay

## 2019-07-10 DIAGNOSIS — R0602 Shortness of breath: Secondary | ICD-10-CM

## 2019-07-18 ENCOUNTER — Telehealth: Payer: Self-pay

## 2019-07-18 NOTE — Telephone Encounter (Signed)
Patient of Dr Harl Bowie called in requesting recent echo results. Her echo was completed on 07/10/19. This has yet to be resulted.

## 2019-07-18 NOTE — Telephone Encounter (Signed)
I just interpreted the echocardiogram.  Pumping function of heart is normal.  There is some stiffness with relaxation attributable to hypertension and aging.

## 2019-07-18 NOTE — Telephone Encounter (Signed)
Called patient with results. Patient verbalized understanding. 

## 2019-07-22 ENCOUNTER — Telehealth: Payer: Self-pay | Admitting: *Deleted

## 2019-07-22 ENCOUNTER — Telehealth: Payer: Self-pay | Admitting: Cardiology

## 2019-07-22 ENCOUNTER — Encounter: Payer: Self-pay | Admitting: *Deleted

## 2019-07-22 NOTE — Telephone Encounter (Signed)
-----   Message from Arnoldo Lenis, MD sent at 07/18/2019  2:05 PM EDT ----- Echo reviewed, I know Dr Raliegh Ip also resulted to the patient. Can we follow up on her symptoms. Is her breathing any better or worst, any chest pain since our last visit. Is her leg swelling any better or worst? Additional info Will help decide if we need to consider any additional testing.

## 2019-07-22 NOTE — Telephone Encounter (Signed)
Patient informed. Instructions read and mailed to patient.

## 2019-07-22 NOTE — Telephone Encounter (Signed)
-----   Message from Herminio Commons, MD sent at 07/22/2019  3:27 PM EDT ----- Let's get a [place] for [place]. ----- Message ----- From: Merlene Laughter, RN Sent: 07/22/2019   3:17 PM EDT To: Arnoldo Lenis, MD  Reports sob is about the same Reports swelling has improved since she has not been at work Denies chest pain since last visit

## 2019-07-22 NOTE — Telephone Encounter (Signed)
Reports breathing is not any better and is about the same. Reports swelling is better because she has not been working this week. Denies chest pain since last visit.

## 2019-07-22 NOTE — Telephone Encounter (Signed)
Pre-cert Verification for the following procedure    Lexiscan for DOE. Scheduled for 07-28-2019 at [facility].

## 2019-07-22 NOTE — Telephone Encounter (Signed)
Given ongoing exertional dyspnea, let us proceed with a Lexiscan Myoview.

## 2019-07-28 ENCOUNTER — Telehealth: Payer: Self-pay | Admitting: *Deleted

## 2019-07-28 ENCOUNTER — Encounter (HOSPITAL_COMMUNITY): Payer: Commercial Managed Care - PPO

## 2019-07-28 DIAGNOSIS — R079 Chest pain, unspecified: Secondary | ICD-10-CM

## 2019-07-30 NOTE — Telephone Encounter (Signed)
Error

## 2019-08-01 ENCOUNTER — Telehealth: Payer: Self-pay | Admitting: Cardiology

## 2019-08-01 ENCOUNTER — Encounter (HOSPITAL_BASED_OUTPATIENT_CLINIC_OR_DEPARTMENT_OTHER)
Admission: RE | Admit: 2019-08-01 | Discharge: 2019-08-01 | Disposition: A | Discharge status: Home / Self Care | Payer: Commercial Managed Care - PPO | Source: Ambulatory Visit | Attending: Cardiology | Admitting: Cardiology

## 2019-08-01 ENCOUNTER — Ambulatory Visit (HOSPITAL_COMMUNITY)
Admission: RE | Admit: 2019-08-01 | Discharge: 2019-08-01 | Disposition: A | Discharge status: Home / Self Care | Payer: Commercial Managed Care - PPO | Source: Ambulatory Visit | Attending: Cardiology | Admitting: Cardiology

## 2019-08-01 ENCOUNTER — Encounter (HOSPITAL_COMMUNITY): Payer: Self-pay

## 2019-08-01 ENCOUNTER — Other Ambulatory Visit: Payer: Self-pay

## 2019-08-01 DIAGNOSIS — R079 Chest pain, unspecified: Secondary | ICD-10-CM

## 2019-08-01 HISTORY — DX: Unspecified asthma, uncomplicated: J45.909

## 2019-08-01 LAB — NM MYOCAR MULTI W/SPECT W/WALL MOTION / EF
LV dias vol: 81 mL (ref 46–106)
LV sys vol: 26 mL
Peak HR: 81 {beats}/min
RATE: 0.35
Rest HR: 60 {beats}/min
SDS: 4
SRS: 4
SSS: 8
TID: 1.14

## 2019-08-01 MED ORDER — TECHNETIUM TC 99M TETROFOSMIN IV KIT
30.0000 | PACK | Freq: Once | INTRAVENOUS | Status: AC | PRN
  Administered 2019-08-01: 32.4 via INTRAVENOUS

## 2019-08-01 MED ORDER — SODIUM CHLORIDE 0.9% FLUSH
INTRAVENOUS | Status: AC
Start: 2019-08-01 — End: 2019-08-01
  Administered 2019-08-01: 10:00:00 1 mL via INTRAVENOUS
  Filled 2019-08-01: qty 10

## 2019-08-01 MED ORDER — NITROGLYCERIN 0.4 MG SL SUBL: 0.4000 mg | SUBLINGUAL_TABLET | SUBLINGUAL | 3 refills | Status: DC | PRN

## 2019-08-01 MED ORDER — FUROSEMIDE 40 MG PO TABS: ORAL_TABLET | ORAL | 1 refills | Status: DC

## 2019-08-01 MED ORDER — TECHNETIUM TC 99M TETROFOSMIN IV KIT
10.0000 | PACK | Freq: Once | INTRAVENOUS | Status: AC | PRN
  Administered 2019-08-01: 09:00:00 11 via INTRAVENOUS

## 2019-08-01 MED ORDER — REGADENOSON 0.4 MG/5ML IV SOLN
INTRAVENOUS | Status: AC
Start: 2019-08-01 — End: 2019-08-01
  Administered 2019-08-01: 10:00:00 0.08 mg via INTRAVENOUS
  Filled 2019-08-01: qty 5

## 2019-08-01 NOTE — Telephone Encounter (Signed)
Pt called stating she's had 7lb weight gain since last week.   [phone]

## 2019-08-01 NOTE — Telephone Encounter (Signed)
Can we start lasix 40mg  prn swelling, I would have her take over the weekend and then prn after that. Call us and update [place] on symptoms Tuesday after the holiday, if severe symptoms over the holiday needs to come to ER. Had stress test today it looks like, it will take a day to get those results.    Zandra Abts MD

## 2019-08-01 NOTE — Telephone Encounter (Signed)
Pt voiced understanding - will update [place] on Tuesday

## 2019-08-01 NOTE — Telephone Encounter (Signed)
Pt has 7lbs weight gain since last week - L leg swollen "all the time anyway" has some swelling in her right leg after sitting at work a while - c/o SOB (had stress test this morning) and last 2 nights c/o "uncomfortable pains" in the left side of her chest lasting 2-3 minutes for an hour - feels washed out and tired

## 2019-08-06 ENCOUNTER — Telehealth: Payer: Self-pay | Admitting: *Deleted

## 2019-08-06 DIAGNOSIS — I10 Essential (primary) hypertension: Secondary | ICD-10-CM

## 2019-08-06 NOTE — Telephone Encounter (Signed)
Pt aware - denies SOB and has lost about 6lbs since starting lasix and hasn't taken any lasix in 2 days weight today is 198lbs - when did you want to f/u with this pt?

## 2019-08-06 NOTE — Telephone Encounter (Signed)
F/U scheduled - pt will have labs done at [facility] next week - will mail lab orders

## 2019-08-06 NOTE — Telephone Encounter (Signed)
-----   Message from Arnoldo Lenis, MD sent at 08/05/2019 12:11 PM EDT ----- Stress test looks good, no evidence of blockages. She had called just prior to the holiday about weight gain and SOb, we started lasix. How are her symptoms doing? Ongoing SOB? Did her weight improve, and what is her current weight   Zandra Abts MD

## 2019-08-06 NOTE — Telephone Encounter (Signed)
Needs a BMET/Mg next week, f/u with me 6 weeks   Zandra Abts MD

## 2019-09-03 ENCOUNTER — Telehealth: Payer: Self-pay | Admitting: *Deleted

## 2019-09-03 NOTE — Telephone Encounter (Signed)
-----   Message from Arnoldo Lenis, MD sent at 09/03/2019  9:51 AM EDT ----- Labs look good, no changes   Zandra Abts MD

## 2019-09-03 NOTE — Telephone Encounter (Signed)
Pt aware.

## 2019-10-02 ENCOUNTER — Encounter: Payer: Self-pay | Admitting: Cardiology

## 2019-10-02 ENCOUNTER — Ambulatory Visit (INDEPENDENT_AMBULATORY_CARE_PROVIDER_SITE_OTHER): Payer: Commercial Managed Care - PPO | Admitting: Cardiology

## 2019-10-02 ENCOUNTER — Other Ambulatory Visit: Payer: Self-pay

## 2019-10-02 VITALS — BP 150/71 | HR 63 | Ht 60.0 in | Wt 206.4 lb

## 2019-10-02 DIAGNOSIS — I5033 Acute on chronic diastolic (congestive) heart failure: Secondary | ICD-10-CM | POA: Diagnosis not present

## 2019-10-02 DIAGNOSIS — I2510 Atherosclerotic heart disease of native coronary artery without angina pectoris: Secondary | ICD-10-CM

## 2019-10-02 MED ORDER — FUROSEMIDE 40 MG PO TABS: 40.0000 mg | ORAL_TABLET | Freq: Every day | ORAL | 1 refills | Status: DC

## 2019-10-02 NOTE — Patient Instructions (Signed)
Your physician recommends that you schedule a follow-up appointment in: [place] has recommended you make the following change in your medication:   STOP CHLORTHALIDONE   CHANGE LASIX 40 MG DAILY  PLEASE CALL us ON Monday WITH AN UPDATE ON YOUR DAILY WEIGHTS   Thank you for choosing [facility]!!

## 2019-10-02 NOTE — Progress Notes (Signed)
Clinical Summary [name] is a 73 y.o.female seen today for follow up of the following medical problems.This is a focused visit on recent issues with DOE.     1. DOE/ Acute on chronic diastolic HF - last visit reported recent SOB/DOE x 2 months. Mainly occurs with activity. No recent chest pain. No cough.Some wheezing, some sinus congestion. Has had some swelling in legs which has worsened. Wearing some compression socks.  - no othopnea.    06/2019 echo LVEF >65%, grade II diastolic dysfunction 01/91 nuclear stress: no clear ischemia.   - we started lasix, lost 6 lbs with improved symptoms. Follow up labs looked fine. Edema briefly improved but has returned.  - weight 06/2019 195 last visit, up to 203 lbs.          SH: works at [employer]. Past Medical History:  Diagnosis Date  . Asthma    Childhood  . Depression   . GERD (gastroesophageal reflux disease)   . Other and unspecified hyperlipidemia   . Other specified cardiac dysrhythmias(427.89)   . Other specified cardiac dysrhythmias(427.89)   . Postinflammatory pulmonary fibrosis ([place])   . Postsurgical aortocoronary bypass status   . Pure hypercholesterolemia   . 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 Type II or unspecified type diabetes mellitus without mention of complication, not stated as uncontrolled   . Unspecified essential hypertension   . Unspecified essential hypertension      No Known Allergies   Current Outpatient Medications  Medication Sig Dispense Refill  . amLODipine (NORVASC) 5 MG tablet Take 5 mg by mouth daily.    Marland Kitchen aspirin 81 MG tablet Take 81 mg by mouth daily.      Marland Kitchen atorvastatin (LIPITOR) 40 MG tablet Take 1 tablet by mouth daily.  0  . chlorthalidone (HYGROTON) 25 MG tablet Take 25 mg by mouth daily.    . furosemide (LASIX) 40 MG tablet TAKE 1 TABLET DAILY FOR 3 DAYS THEN TAKE DAILY AS NEEDED 90 tablet 1  . Insulin Disposable Pump  (OMNIPOD 10 PACK) MISC by Does not apply route. Pump for Humalog - doses her as needed.    Marland Kitchen lisinopril (PRINIVIL,ZESTRIL) 40 MG tablet TAKE ONE TABLET BY MOUTH ONCE DAILY NEEDS  OFFICE  VISIT 30 tablet 0  . nitroGLYCERIN (NITROSTAT) 0.4 MG SL tablet Place 1 tablet (0.4 mg total) under the tongue every 5 (five) minutes as needed. 25 tablet 3  . omeprazole (PRILOSEC) 20 MG capsule Take 20 mg by mouth daily.    . traMADol (ULTRAM) 50 MG tablet Take 1 tablet by mouth as needed.      No current facility-administered medications for this visit.      Past Surgical History:  Procedure Laterality Date  . CORONARY ARTERY BYPASS GRAFT    05/29/2007      No Known Allergies    Family History  Problem Relation Age of Onset  . Cancer Other        Family Hx of Cancer  . Coronary artery disease Other        Family Hx of CAD     Social History [name] reports that she quit smoking about 17 years ago. Her smoking use included cigarettes. She has a 34.00 pack-year smoking history. She has never used smokeless tobacco. [name] reports no history of alcohol use.   Review of Systems CONSTITUTIONAL: No weight loss, fever, chills, weakness or fatigue.  HEENT: Eyes:  No visual loss, blurred vision, double vision or yellow sclerae.No hearing loss, sneezing, congestion, runny nose or sore throat.  SKIN: No rash or itching.  CARDIOVASCULAR: per hpi RESPIRATORY: No shortness of breath, cough or sputum.  GASTROINTESTINAL: No anorexia, nausea, vomiting or diarrhea. No abdominal pain or blood.  GENITOURINARY: No burning on urination, no polyuria NEUROLOGICAL: No headache, dizziness, syncope, paralysis, ataxia, numbness or tingling in the extremities. No change in bowel or bladder control.  MUSCULOSKELETAL: No muscle, back pain, joint pain or stiffness.  LYMPHATICS: No enlarged nodes. No history of splenectomy.  PSYCHIATRIC: No history of depression or anxiety.  ENDOCRINOLOGIC: No reports of  sweating, cold or heat intolerance. No polyuria or polydipsia.  Marland Kitchen   Physical Examination Today's Vitals   10/02/19 1415  BP: (!) 150/71  Pulse: 63  SpO2: 98%  Weight: 206 lb 6.4 oz (93.6 kg)  Height: 5' (1.524 m)   Body mass index is 40.31 kg/m.  Gen: resting comfortably, no acute distress HEENT: no scleral icterus, pupils equal round and reactive, no palptable cervical adenopathy,  CV: RRR, no m/r/g, no jvd Resp: Clear to auscultation bilaterally GI: abdomen is soft, non-tender, non-distended, normal bowel sounds, no hepatosplenomegaly MSK: extremities are warm, 2+ bilateral LE edema Skin: warm, no rash Neuro:  no focal deficits Psych: appropriate affect   Diagnostic Studies Cath 04/2007 FINDINGS: Aortic pressure 115/52 with a mean of 76. Left ventricular  pressure 122/10.  CORONARY ANGIOGRAPHY: Left main is angiographically normal in its  proximal and mid portion. It tapers distally and there is a 50-60%  stenosis in the distal left main.  The LAD is a large-caliber vessel in its proximal portion. The LAD only  extends down to the mid anterior wall. There is a brick. There is a  diagonal branch in the territory of a second diagonal that is the  dominant vessel. There are two septal perforators. The ostium of the  LAD has an 80% stenosis involving the true ostium. It appears very  focal and is difficult to lay out and to lay out by angiography, but  there is clearly a high-grade stenosis there.  The ramus intermedius has 40% stenosis in the mid portion. It is a  medium caliber vessel. The left circumflex has no significant  angiographic disease. It provides a second OM branch site that supplies  a second and third OM branch. The AV groove circumflex is small.  The right coronary artery is diffusely diseased. Proximal portion of  the vessel has luminal irregularities mid portion has a 40 at the site  30-40% stenosis. The vessel branches into a PDA and  also provides two  PL branches. The PDA has a 50% stenosis in its mid portion. The first  PL branch also has a 50% stenosis.  Left ventricular function assessed by 30-degree RAO left  ventriculography is hyperdynamic. There are no regional wall motion  abnormalities. The LVEF is 75%.  ASSESSMENT:  1. Moderate distal left main stenosis.  2. Severe ostial left anterior descending stenosis.  3. Nonobstructive right coronary artery stenosis.  4. Normal to hyperdynamic left ventricular function.  [name] has disease involving the distal left main and ostial left  anterior descending. I think she requires surgical consultation for  coronary bypass. If she has any resting symptoms, we will restart her  heparin.  09/2016 echo Study Conclusions  - Left ventricle: The cavity size was normal. Wall thickness was normal. Systolic function was vigorous. The estimated ejection fraction was in  the range of 75% to 80%. Wall motion was normal; there were no regional wall motion abnormalities. The study is not technically sufficient to allow evaluation of LV diastolic function. - Mitral valve: There was trivial regurgitation. - Right atrium: Central venous pressure (est): 3 mm Hg. - Tricuspid valve: There was trivial regurgitation. - Pulmonary arteries: PA peak pressure: 18 mm Hg (S). - Pericardium, extracardiac: There was no pericardial effusion.  Impressions:  - Normal LV wall thickness with LVEF 75-80%. Indeterminate diastolic function. Normal left atrial chamber size with trivial mitral regurgitation. Trivial tricuspid regurgitation with normal estimated PASP.   09/2016 MPI  The study is normal.  This is a low risk study.  The left ventricular ejection fraction is hyperdynamic (>65%).  There was no ST segment deviation noted during stress.  Normal resting and stress perfusion. No ischemia or infarction EF 84%   06/2019 echo 1. The left  ventricle has hyperdynamic systolic function, with an ejection fraction of >65%. The cavity size was normal. Moderate basal septal hypertrophy. Otherwise mild concentric LVH of remaining myocardium. Left ventricular diastolic Doppler  parameters are consistent with pseudonormalization. Elevated left ventricular end-diastolic pressure.  2. The mitral valve is grossly normal.  3. The tricuspid valve is grossly normal.  4. The aortic valve was not well visualized.  5. The aorta is normal unless otherwise noted.    Assessment and Plan   1. Acute on chronic diastolic HF - echo showed normal LVEF, grade II diastolic dysfunction. Significant volume overload on exam with weight gain - taking lasix just prn, about 4 days a week - stop chlorthalidone, start lasix 40mg  daily. She will call us Monday to update [place] on weights and swelling. Weights by home scale today were 203 lbs. Once diuresing will need repeat BMET/Mg   F/u 6 weeks virtual   Antoine Poche, M.D.

## 2019-10-06 ENCOUNTER — Telehealth: Payer: Self-pay | Admitting: *Deleted

## 2019-10-06 MED ORDER — FUROSEMIDE 40 MG PO TABS: 60.0000 mg | ORAL_TABLET | Freq: Every day | ORAL | 1 refills | Status: DC

## 2019-10-06 NOTE — Telephone Encounter (Signed)
Called to give update on weights and swelling  11/05 203 lbs 11/06 202 lbs 11/07 202 lbs 11/08 203 lbs 11/09 203 lbs  Reports legs still have swelling Confirmed that she takes furosemide 40 mg daily

## 2019-10-06 NOTE — Telephone Encounter (Signed)
Patient informed and verbalized understanding of plan. 

## 2019-10-06 NOTE — Telephone Encounter (Signed)
Incraese lasix to 60mg  daily, update [place] again on Thursday   J Marvelyn Bouchillon MD

## 2019-10-09 ENCOUNTER — Telehealth: Payer: Self-pay

## 2019-10-09 DIAGNOSIS — I10 Essential (primary) hypertension: Secondary | ICD-10-CM

## 2019-10-09 NOTE — Telephone Encounter (Signed)
New message   Pt c/o swelling: STAT is pt has developed SOB within 24 hours  1) How much weight have you gained and in what time span?  ? Pt left information on voicemail   2) If swelling, where is the swelling located?  Legs   3) Are you currently taking a fluid pill?  Yes 1 1/2  4) Are you currently SOB?  no  5) Do you have a log of your daily weights (if so, list)?  Yes Tuesday 203 Wednesday 201 Thursday 202   6) Have you gained 3 pounds in a day or 5 pounds in a week? Not by information left on voicemail   7) Have you traveled recently?    Patient is still concerned with the swelling in her legs

## 2019-10-10 NOTE — Telephone Encounter (Signed)
Patient calling to f/u with her swelling. She has not received a call in regards to this and reports that her weight this morning is 202.5

## 2019-10-10 NOTE — Telephone Encounter (Signed)
Pt c/o swelling for the last 2 months in both legs SOB that had improved some over the last few days - is currently taking lasix 60 mg daily and says this doesn't seem to be helping much - denies any SOB/dizzniess/chest pain weight today is 202lbs - has virtual appt with Dr Harl Bowie on 12/16

## 2019-10-10 NOTE — Telephone Encounter (Signed)
Pt voiced understanding and will update us on Monday  

## 2019-10-10 NOTE — Telephone Encounter (Signed)
Over the weekend increase lasix to 60mg  bid and update [place] again on Monday   Zandra Abts MD

## 2019-10-13 NOTE — Telephone Encounter (Signed)
Some downtrend in weight, continue lasix 60mg  bid. Can she get a bMET/Mg at the end of the week. How is the swelling doing? How is her breathing?   Zandra Abts MD

## 2019-10-13 NOTE — Telephone Encounter (Signed)
Oct 11, 2019   201.5  Oct 12, 2019   200.5 Oct 13, 2019   200.5

## 2019-10-14 NOTE — Addendum Note (Signed)
Addended by: Julian Hy T on: 10/14/2019 11:15 AM   Modules accepted: Orders

## 2019-10-14 NOTE — Telephone Encounter (Signed)
Pt aware and will continue lasix 60 mg bid says she can have labs done on Monday (has to work Friday) - will come by office to pick up and take to [facility] - says swelling is better in the right leg and seems to remain the same on the left leg - says breathing has improved some but not quite back to normal - lab orders placed

## 2019-10-14 NOTE — Telephone Encounter (Signed)
Returning call from yesterday afternoon

## 2019-11-12 ENCOUNTER — Telehealth (INDEPENDENT_AMBULATORY_CARE_PROVIDER_SITE_OTHER): Payer: Commercial Managed Care - PPO | Admitting: Family Medicine

## 2019-11-12 ENCOUNTER — Encounter: Payer: Self-pay | Admitting: Cardiology

## 2019-11-12 VITALS — Ht 60.0 in | Wt 198.0 lb

## 2019-11-12 DIAGNOSIS — I5033 Acute on chronic diastolic (congestive) heart failure: Secondary | ICD-10-CM | POA: Diagnosis not present

## 2019-11-12 DIAGNOSIS — I110 Hypertensive heart disease with heart failure: Secondary | ICD-10-CM | POA: Diagnosis not present

## 2019-11-12 DIAGNOSIS — I10 Essential (primary) hypertension: Secondary | ICD-10-CM

## 2019-11-12 DIAGNOSIS — E782 Mixed hyperlipidemia: Secondary | ICD-10-CM

## 2019-11-12 NOTE — Progress Notes (Signed)
Virtual Visit via Telephone Note   This visit type was conducted due to national recommendations for restrictions regarding the COVID-19 Pandemic (e.g. social distancing) in an effort to limit this patient's exposure and mitigate transmission in our community.  Due to her co-morbid illnesses, this patient is at least at moderate risk for complications without adequate follow up.  This format is felt to be most appropriate for this patient at this time.  The patient did not have access to video technology/had technical difficulties with video requiring transitioning to audio format only (telephone).  All issues noted in this document were discussed and addressed.  No physical exam could be performed with this format.  Please refer to the patient's chart for her  consent to telehealth for [facility]   Date:  11/12/2019   ID:  JENNIFFER VESSELS, DOB 03-02-1946, MRN [id number]  Patient Location: Home Provider Location: Office  PCP:  Neale Burly, MD  Cardiologist:  Carlyle Dolly, MD  Electrophysiologist:  None   Evaluation Performed:  Follow-Up Visit  Chief Complaint: Follow-up for hypertension, coronary artery disease, hyperlipidemia.  Dyspnea on exertion.  History of Present Illness:    [name] is a 73 y.o. female with last encounter with Dr. Harl Bowie on October 02, 2019.   She had been experiencing dyspnea on exertion/acute on chronic heart failure and previously reported both for at least 2 months.  Occurring mainly with activity.  She denied any recent chest pain.  Had some leg swelling which had worsened and was wearing compression stockings.  Most recent echocardiogram was August 2020 with EF of greater than 65% and grade 2 diastolic dysfunction.  Stress test in September 2020 showed no evidence of ischemia.  She was started on Lasix and lost 6 pounds.  Her edema initially improved but had returned.  Her weight had decreased after starting Lasix but she had  gained approximately 8 pounds at subsequent visit.  Today she states her weight is much better.  She is having less dyspnea.  States she is having significant issues with back pain and has been taking tapered dose of prednisone over the last 6 days.  States her blood pressure has been elevated recently most likely due to her pain issues and prednisone dosing.  States he sometimes has transient chest pain at rest.  Denies any radiation or associated symptoms.  She is unsure if this is related to her back issues   States today she weighs 198 and feels much better without any significant dyspnea on exertion.  Lower extremity edema has improved also.  She is limited in duration of activity due to back pain.  She continues to work but states she can only perform exertional activity for 15 to 20 minutes at a time before having to sit down due to back pain.  The patient does not have symptoms concerning for COVID-19 infection (fever, chills, cough, or new shortness of breath).    Past Medical History:  Diagnosis Date  . Asthma    Childhood  . Depression   . GERD (gastroesophageal reflux disease)   . Other and unspecified hyperlipidemia   . Other specified cardiac dysrhythmias(427.89)   . Other specified cardiac dysrhythmias(427.89)   . Postinflammatory pulmonary fibrosis ([place])   . Postsurgical aortocoronary bypass status   . Pure hypercholesterolemia   . 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 Type II or unspecified type diabetes mellitus without mention of complication, not stated as  uncontrolled   . Unspecified essential hypertension   . Unspecified essential hypertension    Past Surgical History:  Procedure Laterality Date  . CORONARY ARTERY BYPASS GRAFT    05/29/2007      No outpatient medications have been marked as taking for the 11/12/19 encounter (Appointment) with Antoine Poche, MD.     Allergies:   Patient has no known allergies.   Social  History   Tobacco Use  . Smoking status: Former Smoker    Packs/day: 1.00    Years: 34.00    Pack years: 34.00    Types: Cigarettes    Quit date: 11/27/2001    Years since quitting: 17.9  . Smokeless tobacco: Never Used  Substance Use Topics  . Alcohol use: No    Alcohol/week: 0.0 standard drinks  . Drug use: No     Family Hx: The patient's family history includes Cancer in an other family member; Coronary artery disease in an other family member.  ROS:   Please see the history of present illness.   Having back pain due to compression fracture taking prednisone for symptom management. All other systems reviewed and are negative.   Prior CV studies:   The following studies were reviewed today:  Diagnostic Studies Cath 04/2007 FINDINGS: Aortic pressure 115/52 with a mean of 76. Left ventricular  pressure 122/10.  CORONARY ANGIOGRAPHY: Left main is angiographically normal in its  proximal and mid portion. It tapers distally and there is a 50-60%  stenosis in the distal left main.  The LAD is a large-caliber vessel in its proximal portion. The LAD only  extends down to the mid anterior wall. There is a brick. There is a  diagonal branch in the territory of a second diagonal that is the  dominant vessel. There are two septal perforators. The ostium of the  LAD has an 80% stenosis involving the true ostium. It appears very  focal and is difficult to lay out and to lay out by angiography, but  there is clearly a high-grade stenosis there.  The ramus intermedius has 40% stenosis in the mid portion. It is a  medium caliber vessel. The left circumflex has no significant  angiographic disease. It provides a second OM branch site that supplies  a second and third OM branch. The AV groove circumflex is small.  The right coronary artery is diffusely diseased. Proximal portion of  the vessel has luminal irregularities mid portion has a 40 at the site  30-40%  stenosis. The vessel branches into a PDA and also provides two  PL branches. The PDA has a 50% stenosis in its mid portion. The first  PL branch also has a 50% stenosis.  Left ventricular function assessed by 30-degree RAO left  ventriculography is hyperdynamic. There are no regional wall motion  abnormalities. The LVEF is 75%.  ASSESSMENT:  1. Moderate distal left main stenosis.  2. Severe ostial left anterior descending stenosis.  3. Nonobstructive right coronary artery stenosis.  4. Normal to hyperdynamic left ventricular function.  Ms. Ronda has disease involving the distal left main and ostial left  anterior descending. I think she requires surgical consultation for  coronary bypass. If she has any resting symptoms, we will restart her  heparin.  09/2016 echo Study Conclusions  - Left ventricle: The cavity size was normal. Wall thickness was normal. Systolic function was vigorous. The estimated ejection fraction was in the range of 75% to 80%. Wall motion was normal; there were no  regional wall motion abnormalities. The study is not technically sufficient to allow evaluation of LV diastolic function. - Mitral valve: There was trivial regurgitation. - Right atrium: Central venous pressure (est): 3 mm Hg. - Tricuspid valve: There was trivial regurgitation. - Pulmonary arteries: PA peak pressure: 18 mm Hg (S). - Pericardium, extracardiac: There was no pericardial effusion.  Impressions:  - Normal LV wall thickness with LVEF 75-80%. Indeterminate diastolic function. Normal left atrial chamber size with trivial mitral regurgitation. Trivial tricuspid regurgitation with normal estimated PASP.   09/2016 MPI  The study is normal.  This is a low risk study.  The left ventricular ejection fraction is hyperdynamic (>65%).  There was no ST segment deviation noted during stress.  Normal resting and stress perfusion. No ischemia or  infarction EF 84%   06/2019 echo 1. The left ventricle has hyperdynamic systolic function, with an ejection fraction of >65%. The cavity size was normal. Moderate basal septal hypertrophy. Otherwise mild concentric LVH of remaining myocardium. Left ventricular diastolic Doppler  parameters are consistent with pseudonormalization. Elevated left ventricular end-diastolic pressure. 2. The mitral valve is grossly normal. 3. The tricuspid valve is grossly normal. 4. The aortic valve was not well visualized. 5. The aorta is normal unless otherwise noted.   Nuclear stress test on August 01, 2019 demonstrated;  There was no ST segment deviation noted during stress.  Probable normal perfusion and mild soft tissue attenuation (diaphragm) No ischemia or scar.  Nuclear stress EF: 68%.  This is a low risk study.     Labs/Other Tests and Data Reviewed:    EKG:  An ECG dated December 05, 2018 was personally reviewed today and demonstrated:  Sinus rhythm 82 minimal ST depression in lateral leads  Recent Labs: No results found for requested labs within last 8760 hours.   Recent Lipid Panel Most recent labs at PCP May 2020 showed total cholesterol 118, triglycerides and 79, HDL 61, LDL 51    Lab Results  Component Value Date/Time   CHOL  05/22/2007 03:45 AM    150        ATP III CLASSIFICATION:  <200     mg/dL   Desirable  826-415  mg/dL   Borderline High  >=830    mg/dL   High   TRIG 940 (H) 76/80/8811 03:45 AM   HDL 34 (L) 05/22/2007 03:45 AM   CHOLHDL 4.4 05/22/2007 03:45 AM   LDLCALC  05/22/2007 03:45 AM    70        Total Cholesterol/HDL:CHD Risk Coronary Heart Disease Risk Table                     Men   Women  1/2 Average Risk   3.4   3.3    Wt Readings from Last 3 Encounters:  10/02/19 206 lb 6.4 oz (93.6 kg)  07/04/19 195 lb (88.5 kg)  12/12/18 187 lb 6.4 oz (85 kg)     Objective:    Vital Signs:  There were no vitals taken for this visit.   Patient  speaking in complete sentences with appropriate speech pattern.  Responding appropriately to questions.  No evidence of dyspnea, cough or wheezing noted.  ASSESSMENT & PLAN:    1. Acute on chronic diastolic heart failure (HCC) States dyspnea on exertion has improved as well as decreased weight.  States she weighs 198 pounds today.  Recent echocardiogram in August showed ejection fraction of >65%. The cavity size was normal. Moderate basal  septal hypertrophy. Otherwise mild concentric LVH of remaining myocardium. Left ventricular diastolic Doppler parameters are consistent with pseudonormalization. Elevated left ventricular end-diastolic pressure. Continue Lasix 40 mg twice a day.  2. Essential hypertension Patient states blood pressure has been up lately but attributes to significant back pain.  Advised patient to start checking blood pressure approximately 2 hours after taking antihypertensive medication.  Advised to rest at least 10 minutes in a sitting position before checking her blood pressures.  Report any sustained blood pressures at or above 140/90.  Continue amlodipine 5 mg, lisinopril 30 mg daily  3. Mixed hyperlipidemia On Lipitor 40 mg daily. Most recent labs at PCP May 2020 showed total cholesterol 118, triglycerides and 79, HDL 61, LDL 51  COVID-19 Education: The signs and symptoms of COVID-19 were discussed with the patient and how to seek care for testing (follow up with PCP or arrange E-visit).  The importance of social distancing was discussed today.  Time:   Today, I have spent 15 minutes with the patient with telehealth technology discussing the above problems.     Medication Adjustments/Labs and Tests Ordered: Current medicines are reviewed at length with the patient today.  Concerns regarding medicines are outlined above.   Tests Ordered: No orders of the defined types were placed in this encounter.   Medication Changes: No orders of the defined types were placed in  this encounter.   Follow Up:  Either In Person or Virtual in 6 month(s)  Signed, Netta NeatANDREW L QUINN JR, NP  11/12/2019 9:10 AM    [facility]

## 2019-11-12 NOTE — Patient Instructions (Signed)

## 2020-02-03 ENCOUNTER — Other Ambulatory Visit: Payer: Self-pay | Admitting: Internal Medicine

## 2020-02-03 DIAGNOSIS — G8929 Other chronic pain: Secondary | ICD-10-CM

## 2020-02-03 DIAGNOSIS — M545 Low back pain, unspecified: Secondary | ICD-10-CM

## 2020-02-27 ENCOUNTER — Other Ambulatory Visit: Payer: Self-pay

## 2020-02-27 ENCOUNTER — Ambulatory Visit
Admission: RE | Admit: 2020-02-27 | Discharge: 2020-02-27 | Disposition: A | Discharge status: Home / Self Care | Payer: Commercial Managed Care - PPO | Source: Ambulatory Visit | Attending: Internal Medicine | Admitting: Internal Medicine

## 2020-02-27 DIAGNOSIS — G8929 Other chronic pain: Secondary | ICD-10-CM

## 2020-02-27 DIAGNOSIS — M545 Low back pain, unspecified: Secondary | ICD-10-CM

## 2020-02-27 MED ORDER — METHYLPREDNISOLONE ACETATE 40 MG/ML INJ SUSP (RADIOLOG
120.0000 mg | Freq: Once | INTRAMUSCULAR | Status: AC
  Administered 2020-02-27: 120 mg via EPIDURAL

## 2020-02-27 MED ORDER — IOPAMIDOL (ISOVUE-M 200) INJECTION 41%
1.0000 mL | Freq: Once | INTRAMUSCULAR | Status: AC
  Administered 2020-02-27: 1 mL via EPIDURAL

## 2020-02-27 NOTE — Discharge Instructions (Signed)

## 2020-03-12 ENCOUNTER — Ambulatory Visit (INDEPENDENT_AMBULATORY_CARE_PROVIDER_SITE_OTHER): Payer: Commercial Managed Care - PPO | Admitting: Cardiology

## 2020-03-12 ENCOUNTER — Encounter: Payer: Self-pay | Admitting: Cardiology

## 2020-03-12 ENCOUNTER — Other Ambulatory Visit: Payer: Self-pay

## 2020-03-12 VITALS — BP 150/58 | HR 72 | Ht 60.0 in | Wt 209.0 lb

## 2020-03-12 DIAGNOSIS — I5033 Acute on chronic diastolic (congestive) heart failure: Secondary | ICD-10-CM

## 2020-03-12 MED ORDER — TORSEMIDE 20 MG PO TABS: 40.0000 mg | ORAL_TABLET | Freq: Every day | ORAL | 0 refills | Status: DC

## 2020-03-12 NOTE — Progress Notes (Signed)
Clinical Summary [name] is a 74 y.o.female seen today for follow up of the following medical problems. This is a focused visit on recent issues with acute on chronic diastolic HF.   1. DOE/ Acute on chronic diastolic HF - last visit reported recent SOB/DOE x 2 months. Mainly occurs with activity. No recent chest pain. No cough.Some wheezing, some sinus congestion. Has had some swelling in legs which has worsened. Wearing some compression socks.  - no othopnea.   06/2019 echo LVEF >65%, grade II diastolic dysfunction 12/5364 nuclear stress: no clear ischemia.   - we started lasix, lost 6 lbs with improved symptoms. Follow up labs looked fine. Edema briefly improved but has returned.  - weight 06/2019 195 last visit, up to 203 lbs.   - weight today 209 lbs. Worsening LE edema. SHe is compliant with lasix 40mg  in AM and 20mg  in PM +orthopnea       SH: retiring June, works at [employer] office  Past Medical History:  Diagnosis Date  . Asthma    Childhood  . Depression   . GERD (gastroesophageal reflux disease)   . Other and unspecified hyperlipidemia   . Other specified cardiac dysrhythmias(427.89)   . Other specified cardiac dysrhythmias(427.89)   . Postinflammatory pulmonary fibrosis ([place])   . Postsurgical aortocoronary bypass status   . Pure hypercholesterolemia   . 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 Type II or unspecified type diabetes mellitus without mention of complication, not stated as uncontrolled   . Unspecified essential hypertension   . Unspecified essential hypertension      No Known Allergies   Current Outpatient Medications  Medication Sig Dispense Refill  . amLODipine (NORVASC) 5 MG tablet Take 5 mg by mouth daily.    Marland Kitchen aspirin 81 MG tablet Take 81 mg by mouth daily.      Marland Kitchen atorvastatin (LIPITOR) 40 MG tablet Take 1 tablet by mouth daily.  0  . furosemide (LASIX) 40 MG tablet Take 40 mg by mouth 2 (two)  times daily.    . Insulin Disposable Pump (OMNIPOD 10 PACK) MISC by Does not apply route. Pump for Humalog - doses her as needed.    Marland Kitchen lisinopril (ZESTRIL) 30 MG tablet Take 30 mg by mouth daily.    . nitroGLYCERIN (NITROSTAT) 0.4 MG SL tablet Place 1 tablet (0.4 mg total) under the tongue every 5 (five) minutes as needed. 25 tablet 3  . omeprazole (PRILOSEC) 20 MG capsule Take 20 mg by mouth daily.    . traMADol (ULTRAM) 50 MG tablet Take 1 tablet by mouth as needed.      No current facility-administered medications for this visit.     Past Surgical History:  Procedure Laterality Date  . CORONARY ARTERY BYPASS GRAFT    05/29/2007      No Known Allergies    Family History  Problem Relation Age of Onset  . Cancer Other        Family Hx of Cancer  . Coronary artery disease Other        Family Hx of CAD     Social History [name] reports that she quit smoking about 18 years ago. Her smoking use included cigarettes. She has a 34.00 pack-year smoking history. She has never used smokeless tobacco. [name] reports no history of alcohol use.   Review of Systems CONSTITUTIONAL: No weight loss, fever, chills, weakness or fatigue.  HEENT: Eyes: No visual  loss, blurred vision, double vision or yellow sclerae.No hearing loss, sneezing, congestion, runny nose or sore throat.  SKIN: No rash or itching.  CARDIOVASCULAR: per hpi RESPIRATORY: No shortness of breath, cough or sputum.  GASTROINTESTINAL: No anorexia, nausea, vomiting or diarrhea. No abdominal pain or blood.  GENITOURINARY: No burning on urination, no polyuria NEUROLOGICAL: No headache, dizziness, syncope, paralysis, ataxia, numbness or tingling in the extremities. No change in bowel or bladder control.  MUSCULOSKELETAL: No muscle, back pain, joint pain or stiffness.  LYMPHATICS: No enlarged nodes. No history of splenectomy.  PSYCHIATRIC: No history of depression or anxiety.  ENDOCRINOLOGIC: No reports of  sweating, cold or heat intolerance. No polyuria or polydipsia.  Marland Kitchen   Physical Examination Today's Vitals   03/12/20 1342  BP: (!) 150/58  Pulse: 72  SpO2: 97%  Weight: 209 lb (94.8 kg)  Height: 5' (1.524 m)   Body mass index is 40.82 kg/m.  Gen: resting comfortably, no acute distress HEENT: no scleral icterus, pupils equal round and reactive, no palptable cervical adenopathy,  CV: RRR, no m/r/g, mildly elevated JVD Resp: Clear to auscultation bilaterally GI: abdomen is soft, non-tender, non-distended, normal bowel sounds, no hepatosplenomegaly MSK: extremities are warm, 1+ bilateral LE edema Skin: warm, no rash Neuro:  no focal deficits Psych: appropriate affect   Diagnostic Studies  Cath 04/2007 FINDINGS: Aortic pressure 115/52 with a mean of 76. Left ventricular  pressure 122/10.  CORONARY ANGIOGRAPHY: Left main is angiographically normal in its  proximal and mid portion. It tapers distally and there is a 50-60%  stenosis in the distal left main.  The LAD is a large-caliber vessel in its proximal portion. The LAD only  extends down to the mid anterior wall. There is a brick. There is a  diagonal Kimarie Coor in the territory of a second diagonal that is the  dominant vessel. There are two septal perforators. The ostium of the  LAD has an 80% stenosis involving the true ostium. It appears very  focal and is difficult to lay out and to lay out by angiography, but  there is clearly a high-grade stenosis there.  The ramus intermedius has 40% stenosis in the mid portion. It is a  medium caliber vessel. The left circumflex has no significant  angiographic disease. It provides a second OM Mira Balon site that supplies  a second and third OM Jamyiah Labella. The AV groove circumflex is small.  The right coronary artery is diffusely diseased. Proximal portion of  the vessel has luminal irregularities mid portion has a 40 at the site  30-40% stenosis. The vessel branches into a  PDA and also provides two  PL branches. The PDA has a 50% stenosis in its mid portion. The first  PL Julene Rahn also has a 50% stenosis.  Left ventricular function assessed by 30-degree RAO left  ventriculography is hyperdynamic. There are no regional wall motion  abnormalities. The LVEF is 75%.  ASSESSMENT:  1. Moderate distal left main stenosis.  2. Severe ostial left anterior descending stenosis.  3. Nonobstructive right coronary artery stenosis.  4. Normal to hyperdynamic left ventricular function.  [name] has disease involving the distal left main and ostial left  anterior descending. I think she requires surgical consultation for  coronary bypass. If she has any resting symptoms, we will restart her  heparin.  09/2016 echo Study Conclusions  - Left ventricle: The cavity size was normal. Wall thickness was normal. Systolic function was vigorous. The estimated ejection fraction was in the range  of 75% to 80%. Wall motion was normal; there were no regional wall motion abnormalities. The study is not technically sufficient to allow evaluation of LV diastolic function. - Mitral valve: There was trivial regurgitation. - Right atrium: Central venous pressure (est): 3 mm Hg. - Tricuspid valve: There was trivial regurgitation. - Pulmonary arteries: PA peak pressure: 18 mm Hg (S). - Pericardium, extracardiac: There was no pericardial effusion.  Impressions:  - Normal LV wall thickness with LVEF 75-80%. Indeterminate diastolic function. Normal left atrial chamber size with trivial mitral regurgitation. Trivial tricuspid regurgitation with normal estimated PASP.   09/2016 MPI  The study is normal.  This is a low risk study.  The left ventricular ejection fraction is hyperdynamic (>65%).  There was no ST segment deviation noted during stress.  Normal resting and stress perfusion. No ischemia or infarction EF 84%   06/2019  echo 1. The left ventricle has hyperdynamic systolic function, with an ejection fraction of >65%. The cavity size was normal. Moderate basal septal hypertrophy. Otherwise mild concentric LVH of remaining myocardium. Left ventricular diastolic Doppler  parameters are consistent with pseudonormalization. Elevated left ventricular end-diastolic pressure. 2. The mitral valve is grossly normal. 3. The tricuspid valve is grossly normal. 4. The aortic valve was not well visualized. 5. The aorta is normal unless otherwise noted.   Assessment and Plan   1. Acute on chronic diastolic HF - echo showed normal LVEF, grade II diastolic dysfunction. - ongoing weight gain and edema not responding to lasix. +SOB, orthopnea - d/c lasix, start torsemide 40mg  daily - update on weights in 1 week - bmet/mg/tsh in 2 weeks   F/u in clinic 6 weeks.    [place], M.D.

## 2020-03-12 NOTE — Patient Instructions (Addendum)
Your physician recommends that you schedule a follow-up appointment in: 6 WEEKS WITH DR [facility]  Your physician has recommended you make the following change in your medication:   STOP LASIX   START TORSEMIDE 40 MG (2 TABLETS) DAILY  Your physician recommends that you return for lab work in 2 WEEKS BMP MG TSH   WEIGHT DAILY AND CALL us IN 1 WEEK WITH THOSE READINGS  Thank you for choosing [facility]!!

## 2020-03-22 ENCOUNTER — Telehealth: Payer: Self-pay | Admitting: Cardiology

## 2020-03-22 NOTE — Telephone Encounter (Signed)
Weight today is 201lbs - has been ranging 204lbs-201lbs - says foot still swelling in the mornings but has been looking better each day since switching from lasix to torsemide 40 mg daily

## 2020-03-22 NOTE — Telephone Encounter (Signed)
Pt is calling to give report on how her fluid pill is doing  Please call [phone]

## 2020-03-23 NOTE — Telephone Encounter (Signed)
Pt aware.

## 2020-03-23 NOTE — Telephone Encounter (Signed)
Continue torsemide, we will see how it works over time   Jonathon Jordan MD

## 2020-04-23 ENCOUNTER — Ambulatory Visit (INDEPENDENT_AMBULATORY_CARE_PROVIDER_SITE_OTHER): Payer: Commercial Managed Care - PPO | Admitting: Cardiology

## 2020-04-23 ENCOUNTER — Other Ambulatory Visit: Payer: Self-pay

## 2020-04-23 ENCOUNTER — Encounter: Payer: Self-pay | Admitting: Cardiology

## 2020-04-23 VITALS — BP 150/58 | HR 64 | Ht 60.0 in | Wt 205.0 lb

## 2020-04-23 DIAGNOSIS — I5033 Acute on chronic diastolic (congestive) heart failure: Secondary | ICD-10-CM

## 2020-04-23 NOTE — Progress Notes (Signed)
Clinical Summary [name] is a 74 y.o.female seen today for a focused follow up for recent issues with acute on chronic diastolic HF.   1. DOE/ Acute on chronic diastolic HF -last visit reportedrecent SOB/DOE x 2 months. Mainly occurs with activity. No recent chest pain. No cough.Some wheezing, some sinus congestion. Has had some swelling in legs which has worsened. Wearing some compression socks.  - no othopnea.   06/2019 echo LVEF >65%, grade II diastolic dysfunction 07/2019 nuclear stress: no clear ischemia.   - last visit stopped lasix, started torsemide 40mg  daily - weight down from 209 to 205 lbs.  - home scale as low 204 lbs.  - ongoing edema, mildly improved.       SH: retiring June, works at [employer] office    Past Medical History:  Diagnosis Date  . Asthma    Childhood  . Depression   . GERD (gastroesophageal reflux disease)   . Other and unspecified hyperlipidemia   . Other specified cardiac dysrhythmias(427.89)   . Other specified cardiac dysrhythmias(427.89)   . Postinflammatory pulmonary fibrosis ([facility])   . Postsurgical aortocoronary bypass status   . Pure hypercholesterolemia   . 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 Type II or unspecified type diabetes mellitus without mention of complication, not stated as uncontrolled   . Unspecified essential hypertension   . Unspecified essential hypertension      No Known Allergies   Current Outpatient Medications  Medication Sig Dispense Refill  . amLODipine (NORVASC) 5 MG tablet Take 5 mg by mouth daily.    [employer] aspirin 81 MG tablet Take 81 mg by mouth daily.      Marland Kitchen atorvastatin (LIPITOR) 40 MG tablet Take 1 tablet by mouth daily.  0  . Insulin Disposable Pump (OMNIPOD 10 PACK) MISC by Does not apply route. Pump for Humalog - doses her as needed.    Marland Kitchen lisinopril (ZESTRIL) 30 MG tablet Take 30 mg by mouth daily.    . nitroGLYCERIN (NITROSTAT) 0.4 MG SL tablet  Place 1 tablet (0.4 mg total) under the tongue every 5 (five) minutes as needed. 25 tablet 3  . omeprazole (PRILOSEC) 20 MG capsule Take 20 mg by mouth daily.    Marland Kitchen torsemide (DEMADEX) 20 MG tablet Take 2 tablets (40 mg total) by mouth daily. 180 tablet 0  . traMADol (ULTRAM) 50 MG tablet Take 1 tablet by mouth as needed.      No current facility-administered medications for this visit.     Past Surgical History:  Procedure Laterality Date  . CORONARY ARTERY BYPASS GRAFT    05/29/2007      No Known Allergies    Family History  Problem Relation Age of Onset  . Cancer Other        Family Hx of Cancer  . Coronary artery disease Other        Family Hx of CAD     Social History [name] reports that she quit smoking about 18 years ago. Her smoking use included cigarettes. She has a 34.00 pack-year smoking history. She has never used smokeless tobacco. [name] reports no history of alcohol use.   Review of Systems CONSTITUTIONAL: No weight loss, fever, chills, weakness or fatigue.  HEENT: Eyes: No visual loss, blurred vision, double vision or yellow sclerae.No hearing loss, sneezing, congestion, runny nose or sore throat.  SKIN: No rash or itching.  CARDIOVASCULAR: per hpi RESPIRATORY: +SOB GASTROINTESTINAL: No  anorexia, nausea, vomiting or diarrhea. No abdominal pain or blood.  GENITOURINARY: No burning on urination, no polyuria NEUROLOGICAL: No headache, dizziness, syncope, paralysis, ataxia, numbness or tingling in the extremities. No change in bowel or bladder control.  MUSCULOSKELETAL: No muscle, back pain, joint pain or stiffness.  LYMPHATICS: No enlarged nodes. No history of splenectomy.  PSYCHIATRIC: No history of depression or anxiety.  ENDOCRINOLOGIC: No reports of sweating, cold or heat intolerance. No polyuria or polydipsia.  Marland Kitchen   Physical Examination Today's Vitals   04/23/20 1122  BP: (!) 150/58  Pulse: 64  SpO2: 98%  Weight: 205 lb (93 kg)    Height: 5' (1.524 m)   Body mass index is 40.04 kg/m.  Gen: resting comfortably, no acute distress HEENT: no scleral icterus, pupils equal round and reactive, no palptable cervical adenopathy,  CV: RRR, no m/r/g, no jvd Resp: Clear to auscultation bilaterally GI: abdomen is soft, non-tender, non-distended, normal bowel sounds, no hepatosplenomegaly MSK: extremities are warm, 2+ bilateral LE edema Skin: warm, no rash Neuro:  no focal deficits Psych: appropriate affect   Diagnostic Studies  Cath 04/2007 FINDINGS: Aortic pressure 115/52 with a mean of 76. Left ventricular  pressure 122/10.  CORONARY ANGIOGRAPHY: Left main is angiographically normal in its  proximal and mid portion. It tapers distally and there is a 50-60%  stenosis in the distal left main.  The LAD is a large-caliber vessel in its proximal portion. The LAD only  extends down to the mid anterior wall. There is a brick. There is a  diagonal Everlynn Sagun in the territory of a second diagonal that is the  dominant vessel. There are two septal perforators. The ostium of the  LAD has an 80% stenosis involving the true ostium. It appears very  focal and is difficult to lay out and to lay out by angiography, but  there is clearly a high-grade stenosis there.  The ramus intermedius has 40% stenosis in the mid portion. It is a  medium caliber vessel. The left circumflex has no significant  angiographic disease. It provides a second OM Allisson Schindel site that supplies  a second and third OM Pansie Guggisberg. The AV groove circumflex is small.  The right coronary artery is diffusely diseased. Proximal portion of  the vessel has luminal irregularities mid portion has a 40 at the site  30-40% stenosis. The vessel branches into a PDA and also provides two  PL branches. The PDA has a 50% stenosis in its mid portion. The first  PL Hagar Sadiq also has a 50% stenosis.  Left ventricular function assessed by 30-degree RAO left   ventriculography is hyperdynamic. There are no regional wall motion  abnormalities. The LVEF is 75%.  ASSESSMENT:  1. Moderate distal left main stenosis.  2. Severe ostial left anterior descending stenosis.  3. Nonobstructive right coronary artery stenosis.  4. Normal to hyperdynamic left ventricular function.  [name] has disease involving the distal left main and ostial left  anterior descending. I think she requires surgical consultation for  coronary bypass. If she has any resting symptoms, we will restart her  heparin.  09/2016 echo Study Conclusions  - Left ventricle: The cavity size was normal. Wall thickness was normal. Systolic function was vigorous. The estimated ejection fraction was in the range of 75% to 80%. Wall motion was normal; there were no regional wall motion abnormalities. The study is not technically sufficient to allow evaluation of LV diastolic function. - Mitral valve: There was trivial regurgitation. - Right atrium:  Central venous pressure (est): 3 mm Hg. - Tricuspid valve: There was trivial regurgitation. - Pulmonary arteries: PA peak pressure: 18 mm Hg (S). - Pericardium, extracardiac: There was no pericardial effusion.  Impressions:  - Normal LV wall thickness with LVEF 75-80%. Indeterminate diastolic function. Normal left atrial chamber size with trivial mitral regurgitation. Trivial tricuspid regurgitation with normal estimated PASP.   09/2016 MPI  The study is normal.  This is a low risk study.  The left ventricular ejection fraction is hyperdynamic (>65%).  There was no ST segment deviation noted during stress.  Normal resting and stress perfusion. No ischemia or infarction EF 84%   06/2019 echo 1. The left ventricle has hyperdynamic systolic function, with an ejection fraction of >65%. The cavity size was normal. Moderate basal septal hypertrophy. Otherwise mild concentric LVH of remaining  myocardium. Left ventricular diastolic Doppler  parameters are consistent with pseudonormalization. Elevated left ventricular end-diastolic pressure. 2. The mitral valve is grossly normal. 3. The tricuspid valve is grossly normal. 4. The aortic valve was not well visualized. 5. The aorta is normal unless otherwise noted.  07/2019 nuclear stress  There was no ST segment deviation noted during stress.  Probable normal perfusion and mild soft tissue attenuation (diaphragm) No ischemia or scar.  Nuclear stress EF: 68%.  This is a low risk study.      Assessment and Plan  1.Acute on chronic diastolic HF - echo showed normal LVEF, grade II diastolic dysfunction. - down 4 lbs since last visit but ongonig swelling, appears to have plateaud with diuresis - reorder BMET/Mg/TSH, if stable labs would increase torsemide to 60mg  daily.     F/u in clinic 6 weeks.       , M.D.

## 2020-04-23 NOTE — Patient Instructions (Signed)
Your physician recommends that you schedule a follow-up appointment in: 6 WEEKS WITH DR [facility]  Your physician recommends that you continue on your current medications as directed. Please refer to the Current Medication list given to you today.  Your physician recommends that you return for lab work BMP/TSH/MAG  Thank you for choosing [facility]!!

## 2020-05-06 ENCOUNTER — Telehealth: Payer: Self-pay | Admitting: Cardiology

## 2020-05-06 DIAGNOSIS — I10 Essential (primary) hypertension: Secondary | ICD-10-CM

## 2020-05-06 NOTE — Telephone Encounter (Signed)
Pt voiced understanding - will mail pt lab results and will have done again at [facility]   Very mild stress on the kidneys on the fluid pill, I would not increase the dose and continue the current regimen at this time. Repeat BMET/Mg in 6 weeks    Dominga Ferry MD

## 2020-05-06 NOTE — Telephone Encounter (Signed)
Just resulted   J Jeno Calleros MD 

## 2020-05-06 NOTE — Telephone Encounter (Signed)
Calling for lab results. °

## 2020-05-14 ENCOUNTER — Ambulatory Visit: Payer: Commercial Managed Care - PPO | Admitting: Cardiology

## 2020-06-01 DIAGNOSIS — Z1231 Encounter for screening mammogram for malignant neoplasm of breast: Secondary | ICD-10-CM | POA: Diagnosis not present

## 2020-06-01 DIAGNOSIS — Z0000 Encounter for general adult medical examination without abnormal findings: Secondary | ICD-10-CM | POA: Diagnosis not present

## 2020-06-04 ENCOUNTER — Ambulatory Visit (INDEPENDENT_AMBULATORY_CARE_PROVIDER_SITE_OTHER): Payer: Medicare Other | Admitting: Cardiology

## 2020-06-04 ENCOUNTER — Other Ambulatory Visit: Payer: Self-pay

## 2020-06-04 DIAGNOSIS — I10 Essential (primary) hypertension: Secondary | ICD-10-CM

## 2020-06-04 DIAGNOSIS — I5033 Acute on chronic diastolic (congestive) heart failure: Secondary | ICD-10-CM

## 2020-06-04 MED ORDER — LISINOPRIL 40 MG PO TABS
40.0000 mg | ORAL_TABLET | Freq: Every day | ORAL | 1 refills | Status: DC
Start: 2020-06-04 — End: 2020-10-08

## 2020-06-04 MED ORDER — TORSEMIDE 20 MG PO TABS: 60.0000 mg | ORAL_TABLET | Freq: Every day | ORAL | 3 refills | Status: DC

## 2020-06-04 NOTE — Patient Instructions (Addendum)
Your physician recommends that you schedule a follow-up appointment in: 6 WEEKS WITH DR BRANCH OR EXTENDER   Your physician has recommended you make the following change in your medication:   STOP AMLODIPINE   INCREASE LISINOPRIL 40 MG DAILY   INCREASE TORSEMIDE 60 MG DAILY (3 TABLETS)  Your physician recommends that you return for lab work in: 2 WEEKS BMP/MG  Thank you for choosing [facility]!!

## 2020-06-04 NOTE — Progress Notes (Signed)
Clinical Summary [name] is a 74 y.o.female seen today for follow up of the following medical problems. This is a focused visit for her history of acute on chronic diastolic HF  1. DOE/ Acute on chronic diastolic HF -last visit reportedrecent SOB/DOE x 2 months. Mainly occurs with activity. No recent chest pain. No cough.Some wheezing, some sinus congestion. Has had some swelling in legs which has worsened. Wearing some compression socks.  - no othopnea.   06/2019 echo LVEF >65%, grade II diastolic dysfunction 07/2019 nuclear stress: no clear ischemia.   - last visit stopped lasix, started torsemide 40mg  daily - weight down from 209 to 205 lbs.  - home scale as low 204 lbs.  - ongoing edema, mildly improved.    - last visit weight had improved but some residual edema. Had some mild stress on kidneys with labs, we did not adjust her diuretic.  - weight 207-208 lbs.  - ongoing edema, SOB     SH: retiring June, works at [employer] Past Medical History:  Diagnosis Date  . Asthma    Childhood  . Depression   . GERD (gastroesophageal reflux disease)   . Other and unspecified hyperlipidemia   . Other specified cardiac dysrhythmias(427.89)   . Other specified cardiac dysrhythmias(427.89)   . Postinflammatory pulmonary fibrosis (HCC)   . Postsurgical aortocoronary bypass status   . Pure hypercholesterolemia   . 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 Type II or unspecified type diabetes mellitus without mention of complication, not stated as uncontrolled   . Unspecified essential hypertension   . Unspecified essential hypertension      No Known Allergies   Current Outpatient Medications  Medication Sig Dispense Refill  . amLODipine (NORVASC) 5 MG tablet Take 5 mg by mouth daily.    [employer] aspirin 81 MG tablet Take 81 mg by mouth daily.      Marland Kitchen atorvastatin (LIPITOR) 40 MG tablet Take 1 tablet by mouth daily.  0  .  Cholecalciferol (VITAMIN D) 125 MCG (5000 UT) CAPS Take by mouth.    . Insulin Disposable Pump (OMNIPOD 10 PACK) MISC by Does not apply route. Pump for Humalog - doses her as needed.    Marland Kitchen lisinopril (ZESTRIL) 30 MG tablet Take 30 mg by mouth daily.    . nitroGLYCERIN (NITROSTAT) 0.4 MG SL tablet Place 1 tablet (0.4 mg total) under the tongue every 5 (five) minutes as needed. 25 tablet 3  . omeprazole (PRILOSEC) 20 MG capsule Take 20 mg by mouth daily.    Marland Kitchen torsemide (DEMADEX) 20 MG tablet Take 2 tablets (40 mg total) by mouth daily. 180 tablet 0  . traMADol (ULTRAM) 50 MG tablet Take 1 tablet by mouth as needed.      No current facility-administered medications for this visit.     Past Surgical History:  Procedure Laterality Date  . CORONARY ARTERY BYPASS GRAFT    05/29/2007      No Known Allergies    Family History  Problem Relation Age of Onset  . Cancer Other        Family Hx of Cancer  . Coronary artery disease Other        Family Hx of CAD     Social History [name] reports that she quit smoking about 18 years ago. Her smoking use included cigarettes. She has a 34.00 pack-year smoking history. She has never used smokeless tobacco. [name] reports no history  of alcohol use.   Review of Systems CONSTITUTIONAL: No weight loss, fever, chills, weakness or fatigue.  HEENT: Eyes: No visual loss, blurred vision, double vision or yellow sclerae.No hearing loss, sneezing, congestion, runny nose or sore throat.  SKIN: No rash or itching.  CARDIOVASCULAR: per hpi RESPIRATORY: No shortness of breath, cough or sputum.  GASTROINTESTINAL: No anorexia, nausea, vomiting or diarrhea. No abdominal pain or blood.  GENITOURINARY: No burning on urination, no polyuria NEUROLOGICAL: No headache, dizziness, syncope, paralysis, ataxia, numbness or tingling in the extremities. No change in bowel or bladder control.  MUSCULOSKELETAL: No muscle, back pain, joint pain or stiffness.    LYMPHATICS: No enlarged nodes. No history of splenectomy.  PSYCHIATRIC: No history of depression or anxiety.  ENDOCRINOLOGIC: No reports of sweating, cold or heat intolerance. No polyuria or polydipsia.  Marland Kitchen   Physical Examination   Gen: resting comfortably, no acute distress HEENT: no scleral icterus, pupils equal round and reactive, no palptable cervical adenopathy,  CV: RRR< no m/r/g, noj vd Resp: Clear to auscultation bilaterally GI: abdomen is soft, non-tender, non-distended, normal bowel sounds, no hepatosplenomegaly MSK: extremities are warm,1+ bilateral edema Skin: warm, no rash Neuro:  no focal deficits Psych: appropriate affect   Diagnostic Studies Cath 04/2007 FINDINGS: Aortic pressure 115/52 with a mean of 76. Left ventricular  pressure 122/10.  CORONARY ANGIOGRAPHY: Left main is angiographically normal in its  proximal and mid portion. It tapers distally and there is a 50-60%  stenosis in the distal left main.  The LAD is a large-caliber vessel in its proximal portion. The LAD only  extends down to the mid anterior wall. There is a brick. There is a  diagonal Ronaldo Crilly in the territory of a second diagonal that is the  dominant vessel. There are two septal perforators. The ostium of the  LAD has an 80% stenosis involving the true ostium. It appears very  focal and is difficult to lay out and to lay out by angiography, but  there is clearly a high-grade stenosis there.  The ramus intermedius has 40% stenosis in the mid portion. It is a  medium caliber vessel. The left circumflex has no significant  angiographic disease. It provides a second OM Jamesha Ellsworth site that supplies  a second and third OM Oseas Detty. The AV groove circumflex is small.  The right coronary artery is diffusely diseased. Proximal portion of  the vessel has luminal irregularities mid portion has a 40 at the site  30-40% stenosis. The vessel branches into a PDA and also provides two  PL  branches. The PDA has a 50% stenosis in its mid portion. The first  PL Khiyan Crace also has a 50% stenosis.  Left ventricular function assessed by 30-degree RAO left  ventriculography is hyperdynamic. There are no regional wall motion  abnormalities. The LVEF is 75%.  ASSESSMENT:  1. Moderate distal left main stenosis.  2. Severe ostial left anterior descending stenosis.  3. Nonobstructive right coronary artery stenosis.  4. Normal to hyperdynamic left ventricular function.  [name] has disease involving the distal left main and ostial left  anterior descending. I think she requires surgical consultation for  coronary bypass. If she has any resting symptoms, we will restart her  heparin.  09/2016 echo Study Conclusions  - Left ventricle: The cavity size was normal. Wall thickness was normal. Systolic function was vigorous. The estimated ejection fraction was in the range of 75% to 80%. Wall motion was normal; there were no regional wall motion abnormalities.  The study is not technically sufficient to allow evaluation of LV diastolic function. - Mitral valve: There was trivial regurgitation. - Right atrium: Central venous pressure (est): 3 mm Hg. - Tricuspid valve: There was trivial regurgitation. - Pulmonary arteries: PA peak pressure: 18 mm Hg (S). - Pericardium, extracardiac: There was no pericardial effusion.  Impressions:  - Normal LV wall thickness with LVEF 75-80%. Indeterminate diastolic function. Normal left atrial chamber size with trivial mitral regurgitation. Trivial tricuspid regurgitation with normal estimated PASP.   09/2016 MPI  The study is normal.  This is a low risk study.  The left ventricular ejection fraction is hyperdynamic (>65%).  There was no ST segment deviation noted during stress.  Normal resting and stress perfusion. No ischemia or infarction EF 84%   06/2019 echo 1. The left ventricle has  hyperdynamic systolic function, with an ejection fraction of >65%. The cavity size was normal. Moderate basal septal hypertrophy. Otherwise mild concentric LVH of remaining myocardium. Left ventricular diastolic Doppler  parameters are consistent with pseudonormalization. Elevated left ventricular end-diastolic pressure. 2. The mitral valve is grossly normal. 3. The tricuspid valve is grossly normal. 4. The aortic valve was not well visualized. 5. The aorta is normal unless otherwise noted.  07/2019 nuclear stress  There was no ST segment deviation noted during stress.  Probable normal perfusion and mild soft tissue attenuation (diaphragm) No ischemia or scar.  Nuclear stress EF: 68%.  This is a low risk study.     Assessment and Plan  1.Acute on chronic diastolic HF - echo showed normal LVEF, grade II diastolic dysfunction. - we will increase torsemide to 60mg  daily for ongoing edema  2. HTN - with leg swelling stop norvasc, increase lisinopril to 40mg  daily.           , M.D.

## 2020-06-09 DIAGNOSIS — H18513 Endothelial corneal dystrophy, bilateral: Secondary | ICD-10-CM | POA: Diagnosis not present

## 2020-06-09 DIAGNOSIS — Z794 Long term (current) use of insulin: Secondary | ICD-10-CM | POA: Diagnosis not present

## 2020-06-09 DIAGNOSIS — H16223 Keratoconjunctivitis sicca, not specified as Sjogren's, bilateral: Secondary | ICD-10-CM | POA: Diagnosis not present

## 2020-06-09 DIAGNOSIS — H2513 Age-related nuclear cataract, bilateral: Secondary | ICD-10-CM | POA: Diagnosis not present

## 2020-06-09 DIAGNOSIS — E109 Type 1 diabetes mellitus without complications: Secondary | ICD-10-CM | POA: Diagnosis not present

## 2020-06-18 DIAGNOSIS — I10 Essential (primary) hypertension: Secondary | ICD-10-CM | POA: Diagnosis not present

## 2020-06-21 ENCOUNTER — Telehealth: Payer: Self-pay | Admitting: Cardiology

## 2020-06-21 NOTE — Telephone Encounter (Signed)
NEW MESSAGE    Pt c/o swelling: STAT is pt has developed SOB within 24 hours  1) How much weight have you gained and in what time span? 1 TO 2 LBS UP AND DOWN  2) If swelling, where is the swelling located? LEG  3) Are you currently taking a fluid pill? YES   4) Are you currently SOB? NO  5) Do you have a log of your daily weights (if so, list)? NO , IT GOES UP AND DOWN ALL DAY   6) Have you gained 3 pounds in a day or 5 pounds in a week? NO  7) Have you traveled recently? NO

## 2020-06-21 NOTE — Telephone Encounter (Signed)
Says she was instructed to contact our office today with an update on her symptoms Reports weighing daily when getting up each day with weights between 202 lbs-204 lbs Reports swelling doesn't occur until she is up and gets active. Reports swelling in left leg is worse than right Denies chest pain, sob or dizziness Medications reviewed

## 2020-06-21 NOTE — Telephone Encounter (Signed)
Continue to monitor weights, we will see how her upcoming blood work looks regarding the recent change in fluid pill   Dominga Ferry MD

## 2020-06-21 NOTE — Telephone Encounter (Signed)
Patient informed and verbalized understanding of plan. 

## 2020-06-24 ENCOUNTER — Telehealth: Payer: Self-pay | Admitting: Cardiology

## 2020-06-24 MED ORDER — TORSEMIDE 20 MG PO TABS
ORAL_TABLET | ORAL | 3 refills | Status: DC
Start: 2020-06-24 — End: 2020-08-13

## 2020-06-24 MED ORDER — POTASSIUM CHLORIDE CRYS ER 20 MEQ PO TBCR
EXTENDED_RELEASE_TABLET | ORAL | 3 refills | Status: DC
Start: 2020-06-24 — End: 2020-09-24

## 2020-06-24 NOTE — Telephone Encounter (Signed)
Labs show some mild increase stress on kidneys. Verify taking torsemide 60mg  daily, if so would alternate 60mg  one day and 40mg  the next. Potassium is low due to the fluid pill, start KCl x 3 days, then daily after that  MD  Pt notified and voiced understanding.

## 2020-06-24 NOTE — Telephone Encounter (Signed)
Patient called requesting recent lab results.  °

## 2020-07-15 ENCOUNTER — Ambulatory Visit: Payer: Medicare Other | Admitting: Cardiology

## 2020-07-15 ENCOUNTER — Encounter: Payer: Self-pay | Admitting: Cardiology

## 2020-07-15 VITALS — BP 148/68 | HR 65 | Ht 60.0 in | Wt 205.0 lb

## 2020-07-15 DIAGNOSIS — I5032 Chronic diastolic (congestive) heart failure: Secondary | ICD-10-CM

## 2020-07-15 DIAGNOSIS — I10 Essential (primary) hypertension: Secondary | ICD-10-CM

## 2020-07-15 NOTE — Patient Instructions (Signed)
Your physician wants you to follow-up in: 6 MONTHS WITH DR [facility] You will receive a reminder letter in the mail two months in advance. If you don't receive a letter, please call our office to schedule the follow-up appointment.  Your physician recommends that you continue on your current medications as directed. Please refer to the Current Medication list given to you today.  Your physician recommends that you return for lab work in: 1 WEEK BMP/MG  Thank you for choosing [facility]!!

## 2020-07-15 NOTE — Progress Notes (Signed)
Clinical Summary [name] is a 74 y.o.female seen today for follow up of the following medical problems.   1. DOE/ Acute on chronic diastolic HF -last visit reportedrecent SOB/DOE x 2 months. Mainly occurs with activity. No recent chest pain. No cough.Some wheezing, some sinus congestion. Has had some swelling in legs which has worsened. Wearing some compression socks.  - no othopnea.   06/2019 echo LVEF >65%, grade II diastolic dysfunction 07/2019 nuclear stress: no clear ischemia.   - last visit we increased torsemide to 60mg  daily - we stopped norvasc due to leg swellnig, lisinopril was increased to 40mg  daily.  - labs showed mild increase in Cr, we chagned torsmide to 60mg  alternatiging days with 40mg   - has compression stockings - SOB has improved   2. 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 LVEF was normal at that time by cath LV gram.  - has had atypical chest pain for quite some time. Sharp pain that lasts just a few seconds, worst with palpation. No SOB, no DOE - echo 09/2016 LVEF 75-80%, no WMAs, cannot eval diastolic function. - 09/2016 Lexiscan MPI: no ischemia.  - recent SOB/DOE x 2 months. Mainly occurs with activity. No recent chest pain. No cough.Some wheezing, some sinus congestion. Has had some swelling in legs which has worsened. Wearing some compression socks.  - no othopnea.   3. Hyperlipidemia  -labs followed by pcp, compliant with statin   4. HTN  - has not taken meds yet today    SH: retiring June, works at [employer] Has had covid vaccine   Past Medical History:  Diagnosis Date  . Asthma    Childhood  . Depression   . GERD (gastroesophageal reflux disease)   . Other and unspecified hyperlipidemia   . Other specified cardiac dysrhythmias(427.89)   . Other specified cardiac dysrhythmias(427.89)   . Postinflammatory pulmonary  fibrosis (HCC)   . Postsurgical aortocoronary bypass status   . Pure hypercholesterolemia   . 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 Type II or unspecified type diabetes mellitus without mention of complication, not stated as uncontrolled   . Unspecified essential hypertension   . Unspecified essential hypertension      No Known Allergies   Current Outpatient Medications  Medication Sig Dispense Refill  . aspirin 81 MG tablet Take 81 mg by mouth daily.      10/2016 atorvastatin (LIPITOR) 40 MG tablet Take 1 tablet by mouth daily.  0  . Cholecalciferol (VITAMIN D) 125 MCG (5000 UT) CAPS Take by mouth.    . Insulin Disposable Pump (OMNIPOD 10 PACK) MISC by Does not apply route. Pump for Humalog - doses her as needed.    10/2016 lisinopril (ZESTRIL) 40 MG tablet Take 1 tablet (40 mg total) by mouth daily. 90 tablet 1  . nitroGLYCERIN (NITROSTAT) 0.4 MG SL tablet Place 1 tablet (0.4 mg total) under the tongue every 5 (five) minutes as needed. 25 tablet 3  . omeprazole (PRILOSEC) 20 MG capsule Take 20 mg by mouth daily.    . potassium chloride SA (KLOR-CON) 20 MEQ tablet Take 40 mEq for 3 Days then start 20 mEq daily after that 93 tablet 3  . torsemide (DEMADEX) 20 MG tablet Take 60 mg Daily alternating with 40 mg Daily 225 tablet 3  . traMADol (ULTRAM) 50 MG tablet Take 1  tablet by mouth as needed.      No current facility-administered medications for this visit.     Past Surgical History:  Procedure Laterality Date  . CORONARY ARTERY BYPASS GRAFT    05/29/2007      No Known Allergies    Family History  Problem Relation Age of Onset  . Cancer Other        Family Hx of Cancer  . Coronary artery disease Other        Family Hx of CAD     Social History [name] reports that she quit smoking about 18 years ago. Her smoking use included cigarettes. She has a 34.00 pack-year smoking history. She has never used smokeless tobacco. [name] reports  no history of alcohol use.   Review of Systems CONSTITUTIONAL: No weight loss, fever, chills, weakness or fatigue.  HEENT: Eyes: No visual loss, blurred vision, double vision or yellow sclerae.No hearing loss, sneezing, congestion, runny nose or sore throat.  SKIN: No rash or itching.  CARDIOVASCULAR: per hpi RESPIRATORY: No shortness of breath, cough or sputum.  GASTROINTESTINAL: No anorexia, nausea, vomiting or diarrhea. No abdominal pain or blood.  GENITOURINARY: No burning on urination, no polyuria NEUROLOGICAL: No headache, dizziness, syncope, paralysis, ataxia, numbness or tingling in the extremities. No change in bowel or bladder control.  MUSCULOSKELETAL: No muscle, back pain, joint pain or stiffness.  LYMPHATICS: No enlarged nodes. No history of splenectomy.  PSYCHIATRIC: No history of depression or anxiety.  ENDOCRINOLOGIC: No reports of sweating, cold or heat intolerance. No polyuria or polydipsia.  Marland Kitchen   Physical Examination Today's Vitals   07/15/20 1010  BP: (!) 148/68  Pulse: 65  SpO2: 95%  Weight: 205 lb (93 kg)  Height: 5' (1.524 m)   Body mass index is 40.04 kg/m.  Gen: resting comfortably, no acute distress HEENT: no scleral icterus, pupils equal round and reactive, no palptable cervical adenopathy,  CV: RRR, no m/r/,g no jvd Resp: Clear to auscultation bilaterally GI: abdomen is soft, non-tender, non-distended, normal bowel sounds, no hepatosplenomegaly MSK: extremities are warm, no edema.  Skin: warm, no rash Neuro:  no focal deficits Psych: appropriate affect   Diagnostic Studies Cath 04/2007 FINDINGS: Aortic pressure 115/52 with a mean of 76. Left ventricular  pressure 122/10.  CORONARY ANGIOGRAPHY: Left main is angiographically normal in its  proximal and mid portion. It tapers distally and there is a 50-60%  stenosis in the distal left main.  The LAD is a large-caliber vessel in its proximal portion. The LAD only  extends down to the mid  anterior wall. There is a brick. There is a  diagonal [name] in the territory of a second diagonal that is the  dominant vessel. There are two septal perforators. The ostium of the  LAD has an 80% stenosis involving the true ostium. It appears very  focal and is difficult to lay out and to lay out by angiography, but  there is clearly a high-grade stenosis there.  The ramus intermedius has 40% stenosis in the mid portion. It is a  medium caliber vessel. The left circumflex has no significant  angiographic disease. It provides a second OM Rutilio Yellowhair site that supplies  a second and third OM Tekisha Darcey. The AV groove circumflex is small.  The right coronary artery is diffusely diseased. Proximal portion of  the vessel has luminal irregularities mid portion has a 40 at the site  30-40% stenosis. The vessel branches into a PDA and also provides two  PL branches. The PDA has a 50% stenosis in its mid portion. The first  PL Grayson White also has a 50% stenosis.  Left ventricular function assessed by 30-degree RAO left  ventriculography is hyperdynamic. There are no regional wall motion  abnormalities. The LVEF is 75%.  ASSESSMENT:  1. Moderate distal left main stenosis.  2. Severe ostial left anterior descending stenosis.  3. Nonobstructive right coronary artery stenosis.  4. Normal to hyperdynamic left ventricular function.  [name] has disease involving the distal left main and ostial left  anterior descending. I think she requires surgical consultation for  coronary bypass. If she has any resting symptoms, we will restart her  heparin.  09/2016 echo Study Conclusions  - Left ventricle: The cavity size was normal. Wall thickness was normal. Systolic function was vigorous. The estimated ejection fraction was in the range of 75% to 80%. Wall motion was normal; there were no regional wall motion abnormalities. The study is not technically sufficient to allow  evaluation of LV diastolic function. - Mitral valve: There was trivial regurgitation. - Right atrium: Central venous pressure (est): 3 mm Hg. - Tricuspid valve: There was trivial regurgitation. - Pulmonary arteries: PA peak pressure: 18 mm Hg (S). - Pericardium, extracardiac: There was no pericardial effusion.  Impressions:  - Normal LV wall thickness with LVEF 75-80%. Indeterminate diastolic function. Normal left atrial chamber size with trivial mitral regurgitation. Trivial tricuspid regurgitation with normal estimated PASP.   09/2016 MPI  The study is normal.  This is a low risk study.  The left ventricular ejection fraction is hyperdynamic (>65%).  There was no ST segment deviation noted during stress.  Normal resting and stress perfusion. No ischemia or infarction EF 84%   06/2019 echo 1. The left ventricle has hyperdynamic systolic function, with an ejection fraction of >65%. The cavity size was normal. Moderate basal septal hypertrophy. Otherwise mild concentric LVH of remaining myocardium. Left ventricular diastolic Doppler  parameters are consistent with pseudonormalization. Elevated left ventricular end-diastolic pressure. 2. The mitral valve is grossly normal. 3. The tricuspid valve is grossly normal. 4. The aortic valve was not well visualized. 5. The aorta is normal unless otherwise noted.  07/2019 nuclear stress  There was no ST segment deviation noted during stress.  Probable normal perfusion and mild soft tissue attenuation (diaphragm) No ischemia or scar.  Nuclear stress EF: 68%.  This is a low risk study.    Assessment and Plan  1.Chronic diastolic HF -overall doing well, continue diuretic - recheck BMET/Mg   2. HTN  Not at goal but has not taken meds yet today, continue to monitor        Antoine Poche, M.D

## 2020-07-23 DIAGNOSIS — I10 Essential (primary) hypertension: Secondary | ICD-10-CM | POA: Diagnosis not present

## 2020-07-26 DIAGNOSIS — E109 Type 1 diabetes mellitus without complications: Secondary | ICD-10-CM | POA: Diagnosis not present

## 2020-07-26 DIAGNOSIS — H25013 Cortical age-related cataract, bilateral: Secondary | ICD-10-CM | POA: Diagnosis not present

## 2020-07-26 DIAGNOSIS — H2512 Age-related nuclear cataract, left eye: Secondary | ICD-10-CM | POA: Diagnosis not present

## 2020-07-26 DIAGNOSIS — H2513 Age-related nuclear cataract, bilateral: Secondary | ICD-10-CM | POA: Diagnosis not present

## 2020-07-26 DIAGNOSIS — H52213 Irregular astigmatism, bilateral: Secondary | ICD-10-CM | POA: Diagnosis not present

## 2020-07-26 DIAGNOSIS — H35033 Hypertensive retinopathy, bilateral: Secondary | ICD-10-CM | POA: Diagnosis not present

## 2020-07-28 DIAGNOSIS — I10 Essential (primary) hypertension: Secondary | ICD-10-CM | POA: Diagnosis not present

## 2020-07-28 DIAGNOSIS — E119 Type 2 diabetes mellitus without complications: Secondary | ICD-10-CM | POA: Diagnosis not present

## 2020-07-28 DIAGNOSIS — E7849 Other hyperlipidemia: Secondary | ICD-10-CM | POA: Diagnosis not present

## 2020-08-10 ENCOUNTER — Telehealth: Payer: Self-pay | Admitting: *Deleted

## 2020-08-10 DIAGNOSIS — H25812 Combined forms of age-related cataract, left eye: Secondary | ICD-10-CM | POA: Diagnosis not present

## 2020-08-10 DIAGNOSIS — H2512 Age-related nuclear cataract, left eye: Secondary | ICD-10-CM | POA: Diagnosis not present

## 2020-08-10 DIAGNOSIS — I10 Essential (primary) hypertension: Secondary | ICD-10-CM

## 2020-08-10 NOTE — Telephone Encounter (Signed)
-----   Message from Antoine Poche, MD sent at 08/10/2020 11:54 AM EDT ----- Some ongoing stress on kidneys, please lower toresmide to 20mg  daily, can take additional 20mg  as needed for severe swellling. Repeat BMET in 3 weeks   MD

## 2020-08-10 NOTE — Telephone Encounter (Signed)
No answer no VM on home phone and mobile # not a working number

## 2020-08-13 MED ORDER — TORSEMIDE 20 MG PO TABS: ORAL_TABLET | ORAL | 1 refills | Status: DC

## 2020-08-13 NOTE — Telephone Encounter (Signed)
Pt voiced understanding - updated medication list and placed lab orders

## 2020-09-06 DIAGNOSIS — H2511 Age-related nuclear cataract, right eye: Secondary | ICD-10-CM | POA: Diagnosis not present

## 2020-09-06 DIAGNOSIS — H25011 Cortical age-related cataract, right eye: Secondary | ICD-10-CM | POA: Diagnosis not present

## 2020-09-06 DIAGNOSIS — H2589 Other age-related cataract: Secondary | ICD-10-CM | POA: Diagnosis not present

## 2020-09-08 DIAGNOSIS — I10 Essential (primary) hypertension: Secondary | ICD-10-CM | POA: Diagnosis not present

## 2020-09-14 DIAGNOSIS — H2511 Age-related nuclear cataract, right eye: Secondary | ICD-10-CM | POA: Diagnosis not present

## 2020-09-14 DIAGNOSIS — H25011 Cortical age-related cataract, right eye: Secondary | ICD-10-CM | POA: Diagnosis not present

## 2020-09-14 DIAGNOSIS — H25811 Combined forms of age-related cataract, right eye: Secondary | ICD-10-CM | POA: Diagnosis not present

## 2020-09-14 DIAGNOSIS — H2589 Other age-related cataract: Secondary | ICD-10-CM | POA: Diagnosis not present

## 2020-09-22 ENCOUNTER — Telehealth: Payer: Self-pay | Admitting: *Deleted

## 2020-09-22 NOTE — Telephone Encounter (Signed)
-----   Message from Antoine Poche, MD sent at 09/22/2020  3:47 PM EDT ----- Labs show kidney function has improved. Potassium is a little low, verify taking KCl daily, if so increase to daily   J BrancH MD

## 2020-09-24 NOTE — Telephone Encounter (Signed)
Patient returned your call.

## 2020-09-24 NOTE — Telephone Encounter (Signed)
Pt verified that she will

## 2020-09-24 NOTE — Telephone Encounter (Signed)
Pt verified that she was taking KCl 20 meq and will increase to 40 meq daily - also says she is SOB and left foot/ankle is swollen - is currently taking torsemide 20 mg daily and will take additional 20 mg daily over the weekend and update [place] on Monday with swelling/weight/SOB

## 2020-09-25 DIAGNOSIS — R0602 Shortness of breath: Secondary | ICD-10-CM | POA: Diagnosis not present

## 2020-09-25 DIAGNOSIS — Z9114 Patient's other noncompliance with medication regimen: Secondary | ICD-10-CM | POA: Diagnosis not present

## 2020-09-25 DIAGNOSIS — I272 Pulmonary hypertension, unspecified: Secondary | ICD-10-CM | POA: Diagnosis not present

## 2020-09-25 DIAGNOSIS — G441 Vascular headache, not elsewhere classified: Secondary | ICD-10-CM | POA: Diagnosis not present

## 2020-09-25 DIAGNOSIS — Z794 Long term (current) use of insulin: Secondary | ICD-10-CM | POA: Diagnosis not present

## 2020-09-25 DIAGNOSIS — Z951 Presence of aortocoronary bypass graft: Secondary | ICD-10-CM | POA: Diagnosis not present

## 2020-09-25 DIAGNOSIS — I10 Essential (primary) hypertension: Secondary | ICD-10-CM | POA: Diagnosis not present

## 2020-09-25 DIAGNOSIS — I361 Nonrheumatic tricuspid (valve) insufficiency: Secondary | ICD-10-CM | POA: Diagnosis not present

## 2020-09-25 DIAGNOSIS — I160 Hypertensive urgency: Secondary | ICD-10-CM | POA: Diagnosis not present

## 2020-09-25 DIAGNOSIS — I5033 Acute on chronic diastolic (congestive) heart failure: Secondary | ICD-10-CM | POA: Diagnosis not present

## 2020-09-25 DIAGNOSIS — I517 Cardiomegaly: Secondary | ICD-10-CM | POA: Diagnosis not present

## 2020-09-25 DIAGNOSIS — E119 Type 2 diabetes mellitus without complications: Secondary | ICD-10-CM | POA: Diagnosis not present

## 2020-09-25 DIAGNOSIS — R600 Localized edema: Secondary | ICD-10-CM | POA: Diagnosis not present

## 2020-09-25 DIAGNOSIS — G4489 Other headache syndrome: Secondary | ICD-10-CM | POA: Diagnosis not present

## 2020-09-25 DIAGNOSIS — E785 Hyperlipidemia, unspecified: Secondary | ICD-10-CM | POA: Diagnosis not present

## 2020-09-25 DIAGNOSIS — I110 Hypertensive heart disease with heart failure: Secondary | ICD-10-CM | POA: Diagnosis not present

## 2020-09-25 DIAGNOSIS — Z7982 Long term (current) use of aspirin: Secondary | ICD-10-CM | POA: Diagnosis not present

## 2020-09-25 DIAGNOSIS — Z7984 Long term (current) use of oral hypoglycemic drugs: Secondary | ICD-10-CM | POA: Diagnosis not present

## 2020-09-25 DIAGNOSIS — I2510 Atherosclerotic heart disease of native coronary artery without angina pectoris: Secondary | ICD-10-CM | POA: Diagnosis not present

## 2020-09-25 DIAGNOSIS — E876 Hypokalemia: Secondary | ICD-10-CM | POA: Diagnosis not present

## 2020-09-25 DIAGNOSIS — Z87891 Personal history of nicotine dependence: Secondary | ICD-10-CM | POA: Diagnosis not present

## 2020-09-25 DIAGNOSIS — N179 Acute kidney failure, unspecified: Secondary | ICD-10-CM | POA: Diagnosis not present

## 2020-09-25 DIAGNOSIS — R0902 Hypoxemia: Secondary | ICD-10-CM | POA: Diagnosis not present

## 2020-09-25 DIAGNOSIS — R7989 Other specified abnormal findings of blood chemistry: Secondary | ICD-10-CM | POA: Diagnosis not present

## 2020-09-25 DIAGNOSIS — R778 Other specified abnormalities of plasma proteins: Secondary | ICD-10-CM | POA: Diagnosis not present

## 2020-09-25 DIAGNOSIS — R001 Bradycardia, unspecified: Secondary | ICD-10-CM | POA: Diagnosis not present

## 2020-09-25 DIAGNOSIS — R519 Headache, unspecified: Secondary | ICD-10-CM | POA: Diagnosis not present

## 2020-10-04 DIAGNOSIS — I10 Essential (primary) hypertension: Secondary | ICD-10-CM | POA: Diagnosis not present

## 2020-10-04 DIAGNOSIS — R778 Other specified abnormalities of plasma proteins: Secondary | ICD-10-CM | POA: Diagnosis not present

## 2020-10-04 DIAGNOSIS — R519 Headache, unspecified: Secondary | ICD-10-CM | POA: Diagnosis not present

## 2020-10-04 DIAGNOSIS — E119 Type 2 diabetes mellitus without complications: Secondary | ICD-10-CM | POA: Diagnosis not present

## 2020-10-05 DIAGNOSIS — I161 Hypertensive emergency: Secondary | ICD-10-CM | POA: Diagnosis not present

## 2020-10-05 DIAGNOSIS — E119 Type 2 diabetes mellitus without complications: Secondary | ICD-10-CM | POA: Diagnosis not present

## 2020-10-05 DIAGNOSIS — I10 Essential (primary) hypertension: Secondary | ICD-10-CM | POA: Diagnosis not present

## 2020-10-05 DIAGNOSIS — E7849 Other hyperlipidemia: Secondary | ICD-10-CM | POA: Diagnosis not present

## 2020-10-05 DIAGNOSIS — I5031 Acute diastolic (congestive) heart failure: Secondary | ICD-10-CM | POA: Diagnosis not present

## 2020-10-07 NOTE — Progress Notes (Signed)
Cardiology Office Note  Date: 10/08/2020   ID: Fanny, Agan October 06, 1946, MRN [id number]  PCP:  Toma Deiters, MD  Cardiologist:  Dina Rich, MD Electrophysiologist:  None   Chief Complaint [facility] follow-up hypertensive urgency.  History of Present Illness: [name] is a 74 y.o. female with a history of chronic diastolic heart failure, HTN, DOE, CAD (LIMA-LAD, SVG-ramus, SVG-OM, SVG-PDA).  Last encounter with Dr. Wyline Mood 07/15/2020 for chronic diastolic heart failure/dyspnea on exertion.  At previous visit she had reported shortness of breath and DOE x2 months.  It mainly occurred with activity.  No chest pain associated.  Had some lower extremity edema which have worsened.  She was wearing compression socks.  No orthopnea.  Previous echo August 2020 EF greater than 65% with grade 2 DD.  Nuclear stress test 08/17/2019 no clear ischemia.  At a prior visit torsemide was increased to 60 mg daily.  Norvasc was discontinued due to leg swelling.  Lisinopril increased to 40 mg daily.  Shortness of breath had improved.  She was compliant with her statin medications and labs were followed by PCP.  Recent hospital admission 09/26/2020 for hypertensive urgency, elevated troponins, headache, hypertension.  Provider note states patient was admitted with hypertensive urgency secondary to medication noncompliance.  Had elevated troponins with known history of CAD.  She had hypokalemia at admission.  Metoprolol was added which helped controlled her blood pressure.  Troponin had trended down and there was no clear evidence of any significant issues.  She had IV Lasix due to lower extremity edema.  Initially had hypoxia with a sat of 86% that improved with oxygen.  Her blood sugar was controlled on sliding scale insulin.  Provider mentioned patient might need an outpatient follow-up for possible stress test.  She had been doing much better.  She was stable and back to baseline..  She  had dropped her O2 saturations during mobility so home oxygen was set up at 2 L/min which brought her O2 sats to 96% even with exertion.  Discharge medications were hydralazine 25 mg every 8 hours, isosorbide dinitrate 10 mg 3 times daily, metoprolol 25 mg p.o. twice daily.  Spironolactone 12.5 mg p.o. daily, lisinopril 20 mg p.o. daily.  Atorvastatin 40 mg p.o. daily.  As needed nitroglycerin.  Torsemide 20 mg p.o. daily.  Troponin levels were [phone] during recent hospital stay.  Patient is here status post hospital follow-up.  States she is feeling much better since she was discharged from the hospital.  Blood pressure is reasonably well controlled today at 138/60.  States she recently had lab work post hospital visit on on 10/06/2020.  We will request the lab work.  She had an echocardiogram during hospital visit demonstrating. EF of 65 to 70%, G2 DD.  Mild to moderate TR mild pulmonary hypertension with PASP 44 mmHg.  Denies any current DOE on exertion out of proportion to normal mild DOE which is chronic for her.  Denies any anginal symptoms or nitroglycerin use.  Denies any orthostatic symptoms, CVA or TIA-like symptoms, palpitations or arrhythmias, PND, orthopnea, bleeding.  States lower extremity edema has resolved.  Weight today is 209.  She states she had cut back on her Lasix some due to decreased renal function labs.  She states she knows now that she should not have done that.  Past Medical History:  Diagnosis Date  . Asthma    Childhood  . Depression   . GERD (gastroesophageal reflux disease)   .  Other and unspecified hyperlipidemia   . Other specified cardiac dysrhythmias(427.89)   . Other specified cardiac dysrhythmias(427.89)   . Postinflammatory pulmonary fibrosis (HCC)   . Postsurgical aortocoronary bypass status   . Pure hypercholesterolemia   . 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 Type II or unspecified type diabetes mellitus without  mention of complication, not stated as uncontrolled   . Unspecified essential hypertension   . Unspecified essential hypertension     Past Surgical History:  Procedure Laterality Date  . CORONARY ARTERY BYPASS GRAFT    05/29/2007     Current Outpatient Medications  Medication Sig Dispense Refill  . aspirin 81 MG tablet Take 81 mg by mouth daily.      Marland Kitchen atorvastatin (LIPITOR) 40 MG tablet Take 1 tablet by mouth daily.  0  . Cholecalciferol (VITAMIN D) 125 MCG (5000 UT) CAPS Take by mouth.    . Insulin Disposable Pump (OMNIPOD 10 PACK) MISC by Does not apply route. Pump for Humalog - doses her as needed.    Marland Kitchen lisinopril (ZESTRIL) 40 MG tablet Take 1 tablet (40 mg total) by mouth daily. 90 tablet 1  . nitroGLYCERIN (NITROSTAT) 0.4 MG SL tablet Place 1 tablet (0.4 mg total) under the tongue every 5 (five) minutes as needed. 25 tablet 3  . omeprazole (PRILOSEC) 20 MG capsule Take 20 mg by mouth daily.    . potassium chloride SA (KLOR-CON) 20 MEQ tablet Take 40 mEq by mouth daily.    Marland Kitchen torsemide (DEMADEX) 20 MG tablet TAKE 1 TABLET DAILY - MAY TAKE ADDITIONAL TABLET AS NEEDED FOR SWELLING 180 tablet 1  . traMADol (ULTRAM) 50 MG tablet Take 1 tablet by mouth as needed.     . TURMERIC PO Take 1 tablet by mouth daily.     No current facility-administered medications for this visit.   Allergies:  Patient has no known allergies.   Social History: The patient  reports that she quit smoking about 18 years ago. Her smoking use included cigarettes. She has a 34.00 pack-year smoking history. She has never used smokeless tobacco. She reports that she does not drink alcohol and does not use drugs.   Family History: The patient's family history includes Cancer in an other family member; Coronary artery disease in an other family member.   ROS:  Please see the history of present illness. Otherwise, complete review of systems is positive for none.  All other systems are reviewed and negative.   Physical  Exam: VS:  Ht 5' (1.524 m)   Wt 209 lb 6.4 oz (95 kg)   BMI 40.90 kg/m , BMI Body mass index is 40.9 kg/m.  Wt Readings from Last 3 Encounters:  10/08/20 209 lb 6.4 oz (95 kg)  07/15/20 205 lb (93 kg)  04/23/20 205 lb (93 kg)    General: Patient appears comfortable at rest. Neck: Supple, no elevated JVP or carotid bruits, no thyromegaly. Lungs: Clear to auscultation, nonlabored breathing at rest. Cardiac: Regular rate and rhythm, no S3 or significant systolic murmur, no pericardial rub. Extremities: No pitting edema, distal pulses 2+. Skin: Warm and dry. Musculoskeletal: No kyphosis. Neuropsychiatric: Alert and oriented x3, affect grossly appropriate.  ECG:    Recent Labwork: No results found for requested labs within last 8760 hours.     Component Value Date/Time   CHOL  05/22/2007 0345    150        ATP III CLASSIFICATION:  <200  mg/dL   Desirable  498-264  mg/dL   Borderline High  >=158    mg/dL   High   TRIG 309 (H) 40/76/8088 0345   HDL 34 (L) 05/22/2007 0345   CHOLHDL 4.4 05/22/2007 0345   VLDL 46 (H) 05/22/2007 0345   LDLCALC  05/22/2007 0345    70        Total Cholesterol/HDL:CHD Risk Coronary Heart Disease Risk Table                     Men   Women  1/2 Average Risk   3.4   3.3    Other Studies Reviewed Today:  Echocardiogram during hospital stay at [facility] on 09/25/2020 Summary 1. The left ventricle is normal in size with normal wall thickness. 2. The left ventricular systolic function is normal, LVEF is visually estimated at 65-70%. 3. There is grade II diastolic dysfunction (elevated filling pressure). 4. The left atrium is moderately dilated in size. 5. There is mild to moderate tricuspid regurgitation. 6. There is mild pulmonary hypertension, estimated pulmonary artery systolic pressure is 44 mmHg. 7. The right atrium is mildly dilated in size.     Cath 04/2007 FINDINGS: Aortic pressure 115/52 with a mean of 76. Left ventricular   pressure 122/10.  CORONARY ANGIOGRAPHY: Left main is angiographically normal in its  proximal and mid portion. It tapers distally and there is a 50-60%  stenosis in the distal left main.  The LAD is a large-caliber vessel in its proximal portion. The LAD only  extends down to the mid anterior wall. There is a brick. There is a  diagonal branch in the territory of a second diagonal that is the  dominant vessel. There are two septal perforators. The ostium of the  LAD has an 80% stenosis involving the true ostium. It appears very  focal and is difficult to lay out and to lay out by angiography, but  there is clearly a high-grade stenosis there.  The ramus intermedius has 40% stenosis in the mid portion. It is a  medium caliber vessel. The left circumflex has no significant  angiographic disease. It provides a second OM branch site that supplies  a second and third OM branch. The AV groove circumflex is small.  The right coronary artery is diffusely diseased. Proximal portion of  the vessel has luminal irregularities mid portion has a 40 at the site  30-40% stenosis. The vessel branches into a PDA and also provides two  PL branches. The PDA has a 50% stenosis in its mid portion. The first  PL branch also has a 50% stenosis.  Left ventricular function assessed by 30-degree RAO left  ventriculography is hyperdynamic. There are no regional wall motion  abnormalities. The LVEF is 75%.  ASSESSMENT:  1. Moderate distal left main stenosis.  2. Severe ostial left anterior descending stenosis.  3. Nonobstructive right coronary artery stenosis.  4. Normal to hyperdynamic left ventricular function.  Ms. Bapst has disease involving the distal left main and ostial left  anterior descending. I think she requires surgical consultation for  coronary bypass. If she has any resting symptoms, we will restart her  heparin.  09/2016 echo Study Conclusions  - Left  ventricle: The cavity size was normal. Wall thickness was normal. Systolic function was vigorous. The estimated ejection fraction was in the range of 75% to 80%. Wall motion was normal; there were no regional wall motion abnormalities. The study is not technically sufficient to  allow evaluation of LV diastolic function. - Mitral valve: There was trivial regurgitation. - Right atrium: Central venous pressure (est): 3 mm Hg. - Tricuspid valve: There was trivial regurgitation. - Pulmonary arteries: PA peak pressure: 18 mm Hg (S). - Pericardium, extracardiac: There was no pericardial effusion.  Impressions:  - Normal LV wall thickness with LVEF 75-80%. Indeterminate diastolic function. Normal left atrial chamber size with trivial mitral regurgitation. Trivial tricuspid regurgitation with normal estimated PASP.   09/2016 MPI  The study is normal.  This is a low risk study.  The left ventricular ejection fraction is hyperdynamic (>65%).  There was no ST segment deviation noted during stress.  Normal resting and stress perfusion. No ischemia or infarction EF 84%   06/2019 echo 1. The left ventricle has hyperdynamic systolic function, with an ejection fraction of >65%. The cavity size was normal. Moderate basal septal hypertrophy. Otherwise mild concentric LVH of remaining myocardium. Left ventricular diastolic Doppler  parameters are consistent with pseudonormalization. Elevated left ventricular end-diastolic pressure. 2. The mitral valve is grossly normal. 3. The tricuspid valve is grossly normal. 4. The aortic valve was not well visualized. 5. The aorta is normal unless otherwise noted.  07/2019 nuclear stress  There was no ST segment deviation noted during stress.  Probable normal perfusion and mild soft tissue attenuation (diaphragm) No ischemia or scar.  Nuclear stress EF: 68%.  This is a low risk study.  Assessment and Plan:  1. Chronic  diastolic heart failure (HCC)   2. Essential hypertension   3. Mixed hyperlipidemia    1. Chronic diastolic heart failure (HCC) Recent in hospital echocardiogram at [facility] during hospital stay EF of 65 to 70%, G2 DD.  Mild to moderate TR mild pulmonary hypertension with PASP 44 mmHg.  Continue torsemide 20 mg p.o. twice daily.  May take an extra half a pill if increased weight of 3 pounds in 24 hours or 5 pounds in 1 week.  Continue lisinopril 20 mg daily, metoprolol 25 mg p.o. twice daily, spironolactone 12.5 mg daily, torsemide 20 mg p.o. twice daily and extra dose of 10 mg as needed for increased weight of 3 pounds in 24 hours or 5 pounds in 1 week.  2. Essential hypertension Blood pressure reasonably well controlled today.  At 138/60.  Continue hydralazine 25 mg p.o. 3 times daily.  Metoprolol 25 mg p.o. twice daily.  Torsemide 20 mg p.o. twice daily.  3. Mixed hyperlipidemia Continue atorvastatin 40 mg p.o. daily.  Please obtain recent lab work from PCP drawn on 10/06/2020.  4.  Dyspnea on exertion with elevated troponins during recent hospital visit. Previous history of CABG times 02/2007.  History of diabetes, hypertension, hyperlipidemia.  Patient has multiple risk factors for progression of CAD.  DOE could be anginal equivalent given recent elevations and troponins.  Please get a Lexiscan stress test.  Continue nitroglycerin sublingual as needed as well as isosorbide dinitrate 10 mg twice daily.  Medication Adjustments/Labs and Tests Ordered: Current medicines are reviewed at length with the patient today.  Concerns regarding medicines are outlined above.   Disposition: Follow-up with Dr. Wyline Mood or APP 4 to 6 weeks  Signed, Rennis Harding, NP 10/08/2020 9:35 AM    [facility] [address] [place], [place], [place] [address] Phone: [phone]; Fax: [phone]

## 2020-10-08 ENCOUNTER — Ambulatory Visit (INDEPENDENT_AMBULATORY_CARE_PROVIDER_SITE_OTHER): Payer: Medicare Other | Admitting: Family Medicine

## 2020-10-08 ENCOUNTER — Encounter: Payer: Self-pay | Admitting: *Deleted

## 2020-10-08 ENCOUNTER — Encounter: Payer: Self-pay | Admitting: Family Medicine

## 2020-10-08 VITALS — BP 138/60 | HR 73 | Ht 60.0 in | Wt 209.4 lb

## 2020-10-08 DIAGNOSIS — I5032 Chronic diastolic (congestive) heart failure: Secondary | ICD-10-CM | POA: Diagnosis not present

## 2020-10-08 DIAGNOSIS — E782 Mixed hyperlipidemia: Secondary | ICD-10-CM | POA: Diagnosis not present

## 2020-10-08 DIAGNOSIS — I10 Essential (primary) hypertension: Secondary | ICD-10-CM

## 2020-10-08 DIAGNOSIS — I208 Other forms of angina pectoris: Secondary | ICD-10-CM

## 2020-10-08 DIAGNOSIS — R778 Other specified abnormalities of plasma proteins: Secondary | ICD-10-CM

## 2020-10-08 DIAGNOSIS — R0609 Other forms of dyspnea: Secondary | ICD-10-CM

## 2020-10-08 DIAGNOSIS — R0600 Dyspnea, unspecified: Secondary | ICD-10-CM | POA: Diagnosis not present

## 2020-10-08 MED ORDER — TORSEMIDE 20 MG PO TABS
20.0000 mg | ORAL_TABLET | Freq: Two times a day (BID) | ORAL | Status: DC
Start: 2020-10-08 — End: 2021-02-02

## 2020-10-08 NOTE — Patient Instructions (Addendum)
Medication Instructions:   May take an extra 1/2 tab of your 20mg  Torsemide as needed for weight gain of 3 lbs in 24 hours or 5 lbs in one week.   Continue all other current medications.  Labwork: none  Testing/Procedures:  Your physician has requested that you have a lexiscan myoview. For further information please visit . Please follow instruction sheet, as given.  Office will contact with results via phone or letter.    Follow-Up: 4-6 weeks   Any Other Special Instructions Will Be Listed Below (If Applicable).  If you need a refill on your cardiac medications before your next appointment, please call your pharmacy.

## 2020-10-15 ENCOUNTER — Encounter (HOSPITAL_COMMUNITY)
Admission: RE | Admit: 2020-10-15 | Discharge: 2020-10-15 | Disposition: A | Discharge status: Home / Self Care | Payer: Medicare Other | Source: Ambulatory Visit | Attending: Family Medicine | Admitting: Family Medicine

## 2020-10-15 ENCOUNTER — Other Ambulatory Visit: Payer: Self-pay

## 2020-10-15 ENCOUNTER — Telehealth: Payer: Self-pay | Admitting: *Deleted

## 2020-10-15 ENCOUNTER — Encounter (HOSPITAL_BASED_OUTPATIENT_CLINIC_OR_DEPARTMENT_OTHER)
Admission: RE | Admit: 2020-10-15 | Discharge: 2020-10-15 | Disposition: A | Discharge status: Home / Self Care | Payer: Medicare Other | Source: Ambulatory Visit | Attending: Family Medicine | Admitting: Family Medicine

## 2020-10-15 ENCOUNTER — Encounter (HOSPITAL_COMMUNITY): Payer: Self-pay

## 2020-10-15 DIAGNOSIS — I208 Other forms of angina pectoris: Secondary | ICD-10-CM | POA: Insufficient documentation

## 2020-10-15 DIAGNOSIS — R0600 Dyspnea, unspecified: Secondary | ICD-10-CM | POA: Diagnosis not present

## 2020-10-15 DIAGNOSIS — R778 Other specified abnormalities of plasma proteins: Secondary | ICD-10-CM

## 2020-10-15 DIAGNOSIS — R0609 Other forms of dyspnea: Secondary | ICD-10-CM

## 2020-10-15 LAB — NM MYOCAR MULTI W/SPECT W/WALL MOTION / EF
LV dias vol: 82 mL (ref 46–106)
LV sys vol: 31 mL
Peak HR: 67 {beats}/min
RATE: 0.38
Rest HR: 45 {beats}/min
SDS: 1
SRS: 3
SSS: 4

## 2020-10-15 MED ORDER — TECHNETIUM TC 99M TETROFOSMIN IV KIT
10.0000 | PACK | Freq: Once | INTRAVENOUS | Status: AC | PRN
  Administered 2020-10-15: 9.8 via INTRAVENOUS

## 2020-10-15 MED ORDER — SODIUM CHLORIDE FLUSH 0.9 % IV SOLN
INTRAVENOUS | Status: AC
  Administered 2020-10-15: 10 mL via INTRAVENOUS
  Filled 2020-10-15: qty 10

## 2020-10-15 MED ORDER — TECHNETIUM TC 99M TETROFOSMIN IV KIT
30.0000 | PACK | Freq: Once | INTRAVENOUS | Status: AC | PRN
  Administered 2020-10-15: 29.9 via INTRAVENOUS

## 2020-10-15 MED ORDER — REGADENOSON 0.4 MG/5ML IV SOLN
INTRAVENOUS | Status: AC
  Administered 2020-10-15: 0.4 mg via INTRAVENOUS
  Filled 2020-10-15: qty 5

## 2020-10-15 NOTE — Telephone Encounter (Signed)
Lesle Chris, LPN  51/08/2110 6:05 PM EST Back to Top    Notified, copy to pcp.

## 2020-10-15 NOTE — Telephone Encounter (Signed)
-----   Message from Netta Neat., NP sent at 10/15/2020  1:50 PM EST ----- Please call the patient and let her know the stress test did not show any significant areas of lack of blood flow.  It was considered a low risk study.  Thank you

## 2020-10-22 ENCOUNTER — Other Ambulatory Visit: Payer: Self-pay | Admitting: Cardiology

## 2020-10-27 DIAGNOSIS — I5031 Acute diastolic (congestive) heart failure: Secondary | ICD-10-CM | POA: Diagnosis not present

## 2020-10-27 DIAGNOSIS — I161 Hypertensive emergency: Secondary | ICD-10-CM | POA: Diagnosis not present

## 2020-10-27 DIAGNOSIS — Z0000 Encounter for general adult medical examination without abnormal findings: Secondary | ICD-10-CM | POA: Diagnosis not present

## 2020-10-27 DIAGNOSIS — N182 Chronic kidney disease, stage 2 (mild): Secondary | ICD-10-CM | POA: Diagnosis not present

## 2020-10-27 DIAGNOSIS — R5383 Other fatigue: Secondary | ICD-10-CM | POA: Diagnosis not present

## 2020-10-27 DIAGNOSIS — E7849 Other hyperlipidemia: Secondary | ICD-10-CM | POA: Diagnosis not present

## 2020-10-27 DIAGNOSIS — I10 Essential (primary) hypertension: Secondary | ICD-10-CM | POA: Diagnosis not present

## 2020-10-27 DIAGNOSIS — Z79899 Other long term (current) drug therapy: Secondary | ICD-10-CM | POA: Diagnosis not present

## 2020-10-28 DIAGNOSIS — I10 Essential (primary) hypertension: Secondary | ICD-10-CM | POA: Diagnosis not present

## 2020-10-28 DIAGNOSIS — E119 Type 2 diabetes mellitus without complications: Secondary | ICD-10-CM | POA: Diagnosis not present

## 2020-10-28 DIAGNOSIS — R519 Headache, unspecified: Secondary | ICD-10-CM | POA: Diagnosis not present

## 2020-10-28 DIAGNOSIS — R778 Other specified abnormalities of plasma proteins: Secondary | ICD-10-CM | POA: Diagnosis not present

## 2020-11-03 DIAGNOSIS — I10 Essential (primary) hypertension: Secondary | ICD-10-CM | POA: Diagnosis not present

## 2020-11-03 DIAGNOSIS — E119 Type 2 diabetes mellitus without complications: Secondary | ICD-10-CM | POA: Diagnosis not present

## 2020-11-03 DIAGNOSIS — R519 Headache, unspecified: Secondary | ICD-10-CM | POA: Diagnosis not present

## 2020-11-03 DIAGNOSIS — R778 Other specified abnormalities of plasma proteins: Secondary | ICD-10-CM | POA: Diagnosis not present

## 2020-11-04 NOTE — Progress Notes (Signed)
Cardiology Office Note  Date: 11/05/2020   ID: Tanya, Archer 05/22/1946, MRN [id number]  PCP:  Toma Deiters, MD  Cardiologist:  Dina Rich, MD Electrophysiologist:  None   Chief Complaint [facility] follow-up hypertensive urgency.  History of Present Illness: [name] is a 74 y.o. female with a history of chronic diastolic heart failure, HTN, DOE, CAD (LIMA-LAD, SVG-ramus, SVG-OM, SVG-PDA).  Last encounter with Dr. Wyline Mood 07/15/2020 for chronic diastolic heart failure/dyspnea on exertion.  At previous visit she had reported shortness of breath and DOE x2 months.  It mainly occurred with activity.  No chest pain associated.  Had some lower extremity edema which have worsened.  She was wearing compression socks.  No orthopnea.  Previous echo August 2020 EF greater than 65% with grade 2 DD.  Nuclear stress test 08/17/2019 no clear ischemia.  At a prior visit torsemide was increased to 60 mg daily.  Norvasc was discontinued due to leg swelling.  Lisinopril increased to 40 mg daily.  Shortness of breath had improved.  She was compliant with her statin medications and labs were followed by PCP.  Recent hospital admission 09/26/2020 for hypertensive urgency, elevated troponins, headache, hypertension.  Provider note states patient was admitted with hypertensive urgency secondary to medication noncompliance.  Had elevated troponins with known history of CAD.  She had hypokalemia at admission.  Metoprolol was added which helped controlled her blood pressure.  Troponin had trended down and there was no clear evidence of any significant issues.  She had IV Lasix due to lower extremity edema.  Initially had hypoxia with a sat of 86% that improved with oxygen.  Her blood sugar was controlled on sliding scale insulin.  Provider mentioned patient might need an outpatient follow-up for possible stress test.  She had been doing much better.  She was stable and back to baseline..  She  had dropped her O2 saturations during mobility so home oxygen was set up at 2 L/min which brought her O2 sats to 96% even with exertion.  Discharge medications were hydralazine 25 mg every 8 hours, isosorbide dinitrate 10 mg 3 times daily, metoprolol 25 mg p.o. twice daily.  Spironolactone 12.5 mg p.o. daily, lisinopril 20 mg p.o. daily.  Atorvastatin 40 mg p.o. daily.  As needed nitroglycerin.  Torsemide 20 mg p.o. daily.  Troponin levels were [phone] during recent hospital stay.  Was here last visit for post hospital follow-up.  Stated she was feeling much better since she was discharged from the hospital.  Blood pressure was reasonably well controlled at 138/60.  Stated she recently had lab work post hospital visit on on 10/06/2020.  She had an echocardiogram during hospital visit demonstrating. EF of 65 to 70%, G2 DD.  Mild to moderate TR mild pulmonary hypertension with PASP 44 mmHg.  9 any current DOE on exertion out of proportion to normal mild DOE which is chronic for her.  Denied any anginal symptoms or nitroglycerin use.  Denies any orthostatic symptoms, CVA or TIA-like symptoms, palpitations or arrhythmias, PND, orthopnea, bleeding.  Stated lower extremity edema has resolved.  Weight today is 209.  She stated she had cut back on her Lasix some due to decreased renal function labs.   She presents today for follow-up.  She states she feels much tired her since being discharged from the hospital.  She recently had a follow-up with her primary care provider.  She states he decreased her potassium dosage to 20 mEq daily.  Also  states she recently stopped her Alphagan drops.  States she stopped gabapentin due to the way it made her feel.  She states she feels better since stopping but continues to feel tired like she has no energy, and no activity tolerance.  Heart rate is in the 50s today with a blood pressure of 120/48.  Otherwise she denies any anginal or exertional symptoms, palpitations or  arrhythmias, PND, orthopnea, bleeding, no lower extremity edema.  Past Medical History:  Diagnosis Date  . Asthma    Childhood  . Depression   . GERD (gastroesophageal reflux disease)   . Other and unspecified hyperlipidemia   . Other specified cardiac dysrhythmias(427.89)   . Other specified cardiac dysrhythmias(427.89)   . Postinflammatory pulmonary fibrosis (HCC)   . Postsurgical aortocoronary bypass status   . Pure hypercholesterolemia   . 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 Type II or unspecified type diabetes mellitus without mention of complication, not stated as uncontrolled   . Unspecified essential hypertension   . Unspecified essential hypertension     Past Surgical History:  Procedure Laterality Date  . CORONARY ARTERY BYPASS GRAFT    05/29/2007     Current Outpatient Medications  Medication Sig Dispense Refill  . aspirin 81 MG tablet Take 81 mg by mouth daily.      Marland Kitchen atorvastatin (LIPITOR) 40 MG tablet Take 1 tablet by mouth daily.  0  . brimonidine (ALPHAGAN) 0.2 % ophthalmic solution Place 1 drop into the left eye 4 (four) times daily.    . Cholecalciferol (VITAMIN D) 125 MCG (5000 UT) CAPS Take by mouth daily.     Marland Kitchen gabapentin (NEURONTIN) 300 MG capsule Take 300 mg by mouth every 3 (three) days.    . hydrALAZINE (APRESOLINE) 25 MG tablet Take 25 mg by mouth 3 (three) times daily.    . Insulin Disposable Pump (OMNIPOD 10 PACK) MISC by Does not apply route. Pump for Humalog - doses her as needed.    . isosorbide dinitrate (ISORDIL) 10 MG tablet Take 10 mg by mouth 3 (three) times daily.    Marland Kitchen lisinopril (ZESTRIL) 40 MG tablet Take 0.5 tablets by mouth daily.    . metoprolol tartrate (LOPRESSOR) 25 MG tablet Take 25 mg by mouth 2 (two) times daily.    . nitroGLYCERIN (NITROSTAT) 0.4 MG SL tablet DISSOLVE ONE TABLET UNDER THE TONGUE EVERY 5 MINUTES AS NEEDED FOR CHEST PAIN.  DO NOT EXCEED A TOTAL OF 3 DOSES IN 15 MINUTES 25 tablet 3   . ofloxacin (OCUFLOX) 0.3 % ophthalmic solution Place 1 drop into the right eye 2 (two) times daily.    . Omega-3 Fatty Acids (OMEGA-3 FISH OIL PO) Take 1 tablet by mouth daily.    Marland Kitchen omeprazole (PRILOSEC) 20 MG capsule Take 20 mg by mouth daily.    . OXYGEN Inhale into the lungs. As needed    . potassium chloride SA (KLOR-CON) 20 MEQ tablet Take 20 mEq by mouth daily.    . promethazine (PHENERGAN) 25 MG tablet Take 25 mg by mouth as needed.    Marland Kitchen spironolactone (ALDACTONE) 25 MG tablet Take 0.5 tablets by mouth daily.    Marland Kitchen torsemide (DEMADEX) 20 MG tablet Take 1 tablet (20 mg total) by mouth 2 (two) times daily. Takes 2nd dose around noon. (MAY TAKE AN EXTRA 1/2 TAB AS NEEDED FOR WEIGHT GAIN ON 3 LBS IN 24 HRS OR 5 LBS IN 1 WEEK)    . traMADol (  ULTRAM) 50 MG tablet Take 1 tablet by mouth as needed.      No current facility-administered medications for this visit.   Allergies:  Patient has no known allergies.   Social History: The patient  reports that she quit smoking about 18 years ago. Her smoking use included cigarettes. She has a 34.00 pack-year smoking history. She has never used smokeless tobacco. She reports that she does not drink alcohol and does not use drugs.   Family History: The patient's family history includes Cancer in an other family member; Coronary artery disease in an other family member.   ROS:  Please see the history of present illness. Otherwise, complete review of systems is positive for none.  All other systems are reviewed and negative.   Physical Exam: VS:  BP (!) 120/48   Pulse (!) 50   Ht 5' (1.524 m)   Wt 206 lb (93.4 kg)   SpO2 98%   BMI 40.23 kg/m , BMI Body mass index is 40.23 kg/m.  Wt Readings from Last 3 Encounters:  11/05/20 206 lb (93.4 kg)  10/08/20 209 lb 6.4 oz (95 kg)  07/15/20 205 lb (93 kg)    General: Patient appears comfortable at rest. Neck: Supple, no elevated JVP or carotid bruits, no thyromegaly. Lungs: Clear to auscultation,  nonlabored breathing at rest. Cardiac: Regular rate and rhythm, no S3 or significant systolic murmur, no pericardial rub. Extremities: No pitting edema, distal pulses 2+. Skin: Warm and dry. Musculoskeletal: No kyphosis. Neuropsychiatric: Alert and oriented x3, affect grossly appropriate.  ECG:    Recent Labwork: No results found for requested labs within last 8760 hours.     Component Value Date/Time   CHOL  05/22/2007 0345    150        ATP III CLASSIFICATION:  <200     mg/dL   Desirable  659-935  mg/dL   Borderline High  >=701    mg/dL   High   TRIG 779 (H) 39/01/91 0345   HDL 34 (L) 05/22/2007 0345   CHOLHDL 4.4 05/22/2007 0345   VLDL 46 (H) 05/22/2007 0345   LDLCALC  05/22/2007 0345    70        Total Cholesterol/HDL:CHD Risk Coronary Heart Disease Risk Table                     Men   Women  1/2 Average Risk   3.4   3.3    Other Studies Reviewed Today:   NST 10/15/2020 Narrative & Impression   No diagnostic ST segment changes to indicate ischemia.  Small, mild intensity, apical to basal inferolateral defect that is reversible and consistent with ischemia. There is also mild RV radiotracer uptake noted which could be indicative of underlying pulmonary disease or increased left ventricular end-diastolic pressure. TID ratio is normal at 1.16.  This is a low to intermediate risk study.  Nuclear stress EF: 62%.     Echocardiogram during hospital stay at [facility] on 09/25/2020 Summary 1. The left ventricle is normal in size with normal wall thickness. 2. The left ventricular systolic function is normal, LVEF is visually estimated at 65-70%. 3. There is grade II diastolic dysfunction (elevated filling pressure). 4. The left atrium is moderately dilated in size. 5. There is mild to moderate tricuspid regurgitation. 6. There is mild pulmonary hypertension, estimated pulmonary artery systolic pressure is 44 mmHg. 7. The right atrium is mildly dilated in  size.     Cath  04/2007 FINDINGS: Aortic pressure 115/52 with a mean of 76. Left ventricular  pressure 122/10.  CORONARY ANGIOGRAPHY: Left main is angiographically normal in its  proximal and mid portion. It tapers distally and there is a 50-60%  stenosis in the distal left main.  The LAD is a large-caliber vessel in its proximal portion. The LAD only  extends down to the mid anterior wall. There is a brick. There is a  diagonal branch in the territory of a second diagonal that is the  dominant vessel. There are two septal perforators. The ostium of the  LAD has an 80% stenosis involving the true ostium. It appears very  focal and is difficult to lay out and to lay out by angiography, but  there is clearly a high-grade stenosis there.  The ramus intermedius has 40% stenosis in the mid portion. It is a  medium caliber vessel. The left circumflex has no significant  angiographic disease. It provides a second OM branch site that supplies  a second and third OM branch. The AV groove circumflex is small.  The right coronary artery is diffusely diseased. Proximal portion of  the vessel has luminal irregularities mid portion has a 40 at the site  30-40% stenosis. The vessel branches into a PDA and also provides two  PL branches. The PDA has a 50% stenosis in its mid portion. The first  PL branch also has a 50% stenosis.  Left ventricular function assessed by 30-degree RAO left  ventriculography is hyperdynamic. There are no regional wall motion  abnormalities. The LVEF is 75%.  ASSESSMENT:  1. Moderate distal left main stenosis.  2. Severe ostial left anterior descending stenosis.  3. Nonobstructive right coronary artery stenosis.  4. Normal to hyperdynamic left ventricular function.  Ms. Draeger has disease involving the distal left main and ostial left  anterior descending. I think she requires surgical consultation for  coronary bypass. If she has any  resting symptoms, we will restart her  heparin.  09/2016 echo Study Conclusions  - Left ventricle: The cavity size was normal. Wall thickness was normal. Systolic function was vigorous. The estimated ejection fraction was in the range of 75% to 80%. Wall motion was normal; there were no regional wall motion abnormalities. The study is not technically sufficient to allow evaluation of LV diastolic function. - Mitral valve: There was trivial regurgitation. - Right atrium: Central venous pressure (est): 3 mm Hg. - Tricuspid valve: There was trivial regurgitation. - Pulmonary arteries: PA peak pressure: 18 mm Hg (S). - Pericardium, extracardiac: There was no pericardial effusion.  Impressions:  - Normal LV wall thickness with LVEF 75-80%. Indeterminate diastolic function. Normal left atrial chamber size with trivial mitral regurgitation. Trivial tricuspid regurgitation with normal estimated PASP.   09/2016 MPI  The study is normal.  This is a low risk study.  The left ventricular ejection fraction is hyperdynamic (>65%).  There was no ST segment deviation noted during stress.  Normal resting and stress perfusion. No ischemia or infarction EF 84%   06/2019 echo 1. The left ventricle has hyperdynamic systolic function, with an ejection fraction of >65%. The cavity size was normal. Moderate basal septal hypertrophy. Otherwise mild concentric LVH of remaining myocardium. Left ventricular diastolic Doppler  parameters are consistent with pseudonormalization. Elevated left ventricular end-diastolic pressure. 2. The mitral valve is grossly normal. 3. The tricuspid valve is grossly normal. 4. The aortic valve was not well visualized. 5. The aorta is normal unless otherwise noted.  07/2019 nuclear  stress  There was no ST segment deviation noted during stress.  Probable normal perfusion and mild soft tissue attenuation (diaphragm) No ischemia or  scar.  Nuclear stress EF: 68%.  This is a low risk study.  Assessment and Plan:   1. Chronic diastolic heart failure (HCC) Recent in hospital echocardiogram at [facility] during hospital stay EF of 65 to 70%, G2 DD.  Mild to moderate TR mild pulmonary hypertension with PASP 44 mmHg.  Continue torsemide 20 mg p.o. twice daily.  May take an extra half a pill if increased weight of 3 pounds in 24 hours or 5 pounds in 1 week.  Continue lisinopril 20 mg daily, spironolactone 12.5 mg daily, torsemide 20 mg p.o. twice daily and extra dose of 10 mg as needed for increased weight of 3 pounds in 24 hours or 5 pounds in 1 week.  We are decreasing metoprolol to 12.5 mg p.o. twice daily due to patient failing more fatigue, low energy, activity intolerance.  2. Essential hypertension Blood pressure well controlled today at 120/48 continue hydralazine 25 mg p.o. 3 times daily.  Decrease metoprolol to 12.5 mg p.o. twice daily.  Torsemide 20 mg p.o. twice daily.  3. Mixed hyperlipidemia Continue atorvastatin 40 mg p.o. daily.  Please obtain recent lab work from PCP drawn on 10/06/2020.  4.  Dyspnea on exertion with elevated troponins during recent hospital visit. Previous history of CABG times 02/2007.  History of diabetes, hypertension, hyperlipidemia.  Patient has multiple risk factors for progression of CAD.  DOE could be anginal equivalent given recent elevation of troponins. . Recent stress test was considered low to intermediate risk.  There was a small mild intensity, apical to basal inferior lateral defect that was reversible and consistent with ischemia.  Also mild RV tracer uptake which could be indicated above pulmonary disease or increased left ventricular end-diastolic pressure.  Patient denies any active anginal symptoms.  Continue nitroglycerin sublingual as needed as well as isosorbide dinitrate 10 mg twice daily.  Medication Adjustments/Labs and Tests Ordered: Current medicines are reviewed  at length with the patient today.  Concerns regarding medicines are outlined above.   Disposition: Follow-up with Dr. Wyline Mood or APP 6 months  Signed, Rennis Harding, NP 11/05/2020 9:52 AM    [facility] at [facility] [address] [place], [place], [place] [address] Phone: [phone]; Fax: [phone]

## 2020-11-05 ENCOUNTER — Ambulatory Visit (INDEPENDENT_AMBULATORY_CARE_PROVIDER_SITE_OTHER): Payer: Medicare Other | Admitting: Family Medicine

## 2020-11-05 ENCOUNTER — Other Ambulatory Visit: Payer: Self-pay

## 2020-11-05 ENCOUNTER — Encounter: Payer: Self-pay | Admitting: Family Medicine

## 2020-11-05 VITALS — BP 120/48 | HR 50 | Ht 60.0 in | Wt 206.0 lb

## 2020-11-05 DIAGNOSIS — R0600 Dyspnea, unspecified: Secondary | ICD-10-CM | POA: Diagnosis not present

## 2020-11-05 DIAGNOSIS — I5032 Chronic diastolic (congestive) heart failure: Secondary | ICD-10-CM

## 2020-11-05 DIAGNOSIS — I10 Essential (primary) hypertension: Secondary | ICD-10-CM

## 2020-11-05 DIAGNOSIS — R0609 Other forms of dyspnea: Secondary | ICD-10-CM

## 2020-11-05 MED ORDER — METOPROLOL TARTRATE 25 MG PO TABS: 12.5000 mg | ORAL_TABLET | Freq: Two times a day (BID) | ORAL | 1 refills | Status: DC

## 2020-11-05 NOTE — Patient Instructions (Signed)
Your physician wants you to follow-up in: 6 MONTHS WITH DR [facility]   Your physician has recommended you make the following change in your medication:   DECREASE LOPRESSOR 12.5 MG TWICE DAILY   Thank you for choosing  [facility]!!

## 2020-11-28 DIAGNOSIS — E119 Type 2 diabetes mellitus without complications: Secondary | ICD-10-CM | POA: Diagnosis not present

## 2020-11-28 DIAGNOSIS — R778 Other specified abnormalities of plasma proteins: Secondary | ICD-10-CM | POA: Diagnosis not present

## 2020-11-28 DIAGNOSIS — R519 Headache, unspecified: Secondary | ICD-10-CM | POA: Diagnosis not present

## 2020-11-28 DIAGNOSIS — I10 Essential (primary) hypertension: Secondary | ICD-10-CM | POA: Diagnosis not present

## 2020-12-04 DIAGNOSIS — R519 Headache, unspecified: Secondary | ICD-10-CM | POA: Diagnosis not present

## 2020-12-04 DIAGNOSIS — E119 Type 2 diabetes mellitus without complications: Secondary | ICD-10-CM | POA: Diagnosis not present

## 2020-12-04 DIAGNOSIS — I10 Essential (primary) hypertension: Secondary | ICD-10-CM | POA: Diagnosis not present

## 2020-12-04 DIAGNOSIS — R778 Other specified abnormalities of plasma proteins: Secondary | ICD-10-CM | POA: Diagnosis not present

## 2020-12-08 ENCOUNTER — Other Ambulatory Visit: Payer: Self-pay | Admitting: Cardiology

## 2020-12-29 DIAGNOSIS — R778 Other specified abnormalities of plasma proteins: Secondary | ICD-10-CM | POA: Diagnosis not present

## 2020-12-29 DIAGNOSIS — I10 Essential (primary) hypertension: Secondary | ICD-10-CM | POA: Diagnosis not present

## 2020-12-29 DIAGNOSIS — E119 Type 2 diabetes mellitus without complications: Secondary | ICD-10-CM | POA: Diagnosis not present

## 2020-12-29 DIAGNOSIS — R519 Headache, unspecified: Secondary | ICD-10-CM | POA: Diagnosis not present

## 2021-01-04 DIAGNOSIS — E119 Type 2 diabetes mellitus without complications: Secondary | ICD-10-CM | POA: Diagnosis not present

## 2021-01-04 DIAGNOSIS — R778 Other specified abnormalities of plasma proteins: Secondary | ICD-10-CM | POA: Diagnosis not present

## 2021-01-04 DIAGNOSIS — R519 Headache, unspecified: Secondary | ICD-10-CM | POA: Diagnosis not present

## 2021-01-04 DIAGNOSIS — I10 Essential (primary) hypertension: Secondary | ICD-10-CM | POA: Diagnosis not present

## 2021-01-26 DIAGNOSIS — I10 Essential (primary) hypertension: Secondary | ICD-10-CM | POA: Diagnosis not present

## 2021-01-26 DIAGNOSIS — E119 Type 2 diabetes mellitus without complications: Secondary | ICD-10-CM | POA: Diagnosis not present

## 2021-01-26 DIAGNOSIS — R519 Headache, unspecified: Secondary | ICD-10-CM | POA: Diagnosis not present

## 2021-01-26 DIAGNOSIS — R778 Other specified abnormalities of plasma proteins: Secondary | ICD-10-CM | POA: Diagnosis not present

## 2021-01-30 DIAGNOSIS — Z87891 Personal history of nicotine dependence: Secondary | ICD-10-CM | POA: Diagnosis not present

## 2021-01-30 DIAGNOSIS — I214 Non-ST elevation (NSTEMI) myocardial infarction: Secondary | ICD-10-CM | POA: Diagnosis not present

## 2021-01-30 DIAGNOSIS — E161 Other hypoglycemia: Secondary | ICD-10-CM | POA: Diagnosis not present

## 2021-01-30 DIAGNOSIS — Z7984 Long term (current) use of oral hypoglycemic drugs: Secondary | ICD-10-CM | POA: Diagnosis not present

## 2021-01-30 DIAGNOSIS — R404 Transient alteration of awareness: Secondary | ICD-10-CM | POA: Diagnosis not present

## 2021-01-30 DIAGNOSIS — Z20822 Contact with and (suspected) exposure to covid-19: Secondary | ICD-10-CM | POA: Diagnosis not present

## 2021-01-30 DIAGNOSIS — I700 Atherosclerosis of aorta: Secondary | ICD-10-CM | POA: Diagnosis not present

## 2021-01-30 DIAGNOSIS — Z9641 Presence of insulin pump (external) (internal): Secondary | ICD-10-CM | POA: Diagnosis not present

## 2021-01-30 DIAGNOSIS — Z7982 Long term (current) use of aspirin: Secondary | ICD-10-CM | POA: Diagnosis not present

## 2021-01-30 DIAGNOSIS — R9431 Abnormal electrocardiogram [ECG] [EKG]: Secondary | ICD-10-CM | POA: Diagnosis not present

## 2021-01-30 DIAGNOSIS — Z79899 Other long term (current) drug therapy: Secondary | ICD-10-CM | POA: Diagnosis not present

## 2021-01-30 DIAGNOSIS — Z8249 Family history of ischemic heart disease and other diseases of the circulatory system: Secondary | ICD-10-CM | POA: Diagnosis not present

## 2021-01-30 DIAGNOSIS — I517 Cardiomegaly: Secondary | ICD-10-CM | POA: Diagnosis not present

## 2021-01-30 DIAGNOSIS — Z743 Need for continuous supervision: Secondary | ICD-10-CM | POA: Diagnosis not present

## 2021-01-30 DIAGNOSIS — E162 Hypoglycemia, unspecified: Secondary | ICD-10-CM | POA: Diagnosis not present

## 2021-01-30 DIAGNOSIS — R6889 Other general symptoms and signs: Secondary | ICD-10-CM | POA: Diagnosis not present

## 2021-01-31 ENCOUNTER — Inpatient Hospital Stay (HOSPITAL_COMMUNITY)
Admission: AD | Admit: 2021-01-31 | Discharge: 2021-02-02 | DRG: 291 | Disposition: A | Discharge status: Home / Self Care | Payer: Medicare Other | Source: Other Acute Inpatient Hospital | Attending: Cardiovascular Disease | Admitting: Cardiovascular Disease

## 2021-01-31 ENCOUNTER — Encounter (HOSPITAL_COMMUNITY): Payer: Self-pay | Admitting: Cardiology

## 2021-01-31 DIAGNOSIS — R778 Other specified abnormalities of plasma proteins: Secondary | ICD-10-CM | POA: Diagnosis not present

## 2021-01-31 DIAGNOSIS — I248 Other forms of acute ischemic heart disease: Secondary | ICD-10-CM | POA: Diagnosis not present

## 2021-01-31 DIAGNOSIS — E162 Hypoglycemia, unspecified: Secondary | ICD-10-CM

## 2021-01-31 DIAGNOSIS — Z951 Presence of aortocoronary bypass graft: Secondary | ICD-10-CM

## 2021-01-31 DIAGNOSIS — I10 Essential (primary) hypertension: Secondary | ICD-10-CM | POA: Diagnosis present

## 2021-01-31 DIAGNOSIS — J8410 Pulmonary fibrosis, unspecified: Secondary | ICD-10-CM | POA: Diagnosis present

## 2021-01-31 DIAGNOSIS — Z6841 Body Mass Index (BMI) 40.0 and over, adult: Secondary | ICD-10-CM

## 2021-01-31 DIAGNOSIS — I2510 Atherosclerotic heart disease of native coronary artery without angina pectoris: Secondary | ICD-10-CM | POA: Diagnosis not present

## 2021-01-31 DIAGNOSIS — I130 Hypertensive heart and chronic kidney disease with heart failure and stage 1 through stage 4 chronic kidney disease, or unspecified chronic kidney disease: Secondary | ICD-10-CM | POA: Diagnosis not present

## 2021-01-31 DIAGNOSIS — I071 Rheumatic tricuspid insufficiency: Secondary | ICD-10-CM | POA: Diagnosis present

## 2021-01-31 DIAGNOSIS — I4510 Unspecified right bundle-branch block: Secondary | ICD-10-CM | POA: Diagnosis not present

## 2021-01-31 DIAGNOSIS — Z87891 Personal history of nicotine dependence: Secondary | ICD-10-CM | POA: Diagnosis not present

## 2021-01-31 DIAGNOSIS — E118 Type 2 diabetes mellitus with unspecified complications: Secondary | ICD-10-CM

## 2021-01-31 DIAGNOSIS — Z7984 Long term (current) use of oral hypoglycemic drugs: Secondary | ICD-10-CM | POA: Diagnosis not present

## 2021-01-31 DIAGNOSIS — Z20822 Contact with and (suspected) exposure to covid-19: Secondary | ICD-10-CM | POA: Diagnosis not present

## 2021-01-31 DIAGNOSIS — I517 Cardiomegaly: Secondary | ICD-10-CM | POA: Diagnosis not present

## 2021-01-31 DIAGNOSIS — Z7982 Long term (current) use of aspirin: Secondary | ICD-10-CM | POA: Diagnosis not present

## 2021-01-31 DIAGNOSIS — Z79899 Other long term (current) drug therapy: Secondary | ICD-10-CM

## 2021-01-31 DIAGNOSIS — E119 Type 2 diabetes mellitus without complications: Secondary | ICD-10-CM | POA: Diagnosis not present

## 2021-01-31 DIAGNOSIS — F419 Anxiety disorder, unspecified: Secondary | ICD-10-CM | POA: Diagnosis present

## 2021-01-31 DIAGNOSIS — I219 Acute myocardial infarction, unspecified: Secondary | ICD-10-CM | POA: Diagnosis not present

## 2021-01-31 DIAGNOSIS — Z8249 Family history of ischemic heart disease and other diseases of the circulatory system: Secondary | ICD-10-CM | POA: Diagnosis not present

## 2021-01-31 DIAGNOSIS — I214 Non-ST elevation (NSTEMI) myocardial infarction: Secondary | ICD-10-CM

## 2021-01-31 DIAGNOSIS — Z794 Long term (current) use of insulin: Secondary | ICD-10-CM | POA: Diagnosis not present

## 2021-01-31 DIAGNOSIS — N1832 Chronic kidney disease, stage 3b: Secondary | ICD-10-CM | POA: Diagnosis not present

## 2021-01-31 DIAGNOSIS — N183 Chronic kidney disease, stage 3 unspecified: Secondary | ICD-10-CM

## 2021-01-31 DIAGNOSIS — E1122 Type 2 diabetes mellitus with diabetic chronic kidney disease: Secondary | ICD-10-CM | POA: Diagnosis present

## 2021-01-31 DIAGNOSIS — I5033 Acute on chronic diastolic (congestive) heart failure: Secondary | ICD-10-CM | POA: Diagnosis not present

## 2021-01-31 DIAGNOSIS — E11649 Type 2 diabetes mellitus with hypoglycemia without coma: Secondary | ICD-10-CM | POA: Diagnosis not present

## 2021-01-31 DIAGNOSIS — Z9641 Presence of insulin pump (external) (internal): Secondary | ICD-10-CM | POA: Diagnosis not present

## 2021-01-31 DIAGNOSIS — E785 Hyperlipidemia, unspecified: Secondary | ICD-10-CM | POA: Diagnosis not present

## 2021-01-31 DIAGNOSIS — R9431 Abnormal electrocardiogram [ECG] [EKG]: Secondary | ICD-10-CM | POA: Diagnosis not present

## 2021-01-31 DIAGNOSIS — R519 Headache, unspecified: Secondary | ICD-10-CM | POA: Diagnosis not present

## 2021-01-31 DIAGNOSIS — I700 Atherosclerosis of aorta: Secondary | ICD-10-CM | POA: Diagnosis not present

## 2021-01-31 DIAGNOSIS — R0602 Shortness of breath: Secondary | ICD-10-CM

## 2021-01-31 HISTORY — DX: Chronic kidney disease, stage 3 unspecified: N18.30

## 2021-01-31 HISTORY — DX: Unspecified diastolic (congestive) heart failure: I50.30

## 2021-01-31 HISTORY — DX: Essential (primary) hypertension: I10

## 2021-01-31 HISTORY — DX: Hyperlipidemia, unspecified: E78.5

## 2021-01-31 HISTORY — DX: Atherosclerotic heart disease of native coronary artery without angina pectoris: I25.10

## 2021-01-31 LAB — BRAIN NATRIURETIC PEPTIDE: B Natriuretic Peptide: 752.4 pg/mL — ABNORMAL HIGH (ref 0.0–100.0)

## 2021-01-31 LAB — CBC WITH DIFFERENTIAL/PLATELET
Abs Immature Granulocytes: 0.05 10*3/uL (ref 0.00–0.07)
Basophils Absolute: 0 10*3/uL (ref 0.0–0.1)
Basophils Relative: 0 %
Eosinophils Absolute: 0.1 10*3/uL (ref 0.0–0.5)
Eosinophils Relative: 1 %
HCT: 31.5 % — ABNORMAL LOW (ref 36.0–46.0)
Hemoglobin: 10 g/dL — ABNORMAL LOW (ref 12.0–15.0)
Immature Granulocytes: 1 %
Lymphocytes Relative: 14 %
Lymphs Abs: 1.3 10*3/uL (ref 0.7–4.0)
MCH: 28.2 pg (ref 26.0–34.0)
MCHC: 31.7 g/dL (ref 30.0–36.0)
MCV: 88.7 fL (ref 80.0–100.0)
Monocytes Absolute: 0.8 10*3/uL (ref 0.1–1.0)
Monocytes Relative: 9 %
Neutro Abs: 7.3 10*3/uL (ref 1.7–7.7)
Neutrophils Relative %: 75 %
Platelets: 183 10*3/uL (ref 150–400)
RBC: 3.55 MIL/uL — ABNORMAL LOW (ref 3.87–5.11)
RDW: 14 % (ref 11.5–15.5)
WBC: 9.7 10*3/uL (ref 4.0–10.5)
nRBC: 0 % (ref 0.0–0.2)

## 2021-01-31 LAB — COMPREHENSIVE METABOLIC PANEL
ALT: 13 U/L (ref 0–44)
AST: 14 U/L — ABNORMAL LOW (ref 15–41)
Albumin: 3.3 g/dL — ABNORMAL LOW (ref 3.5–5.0)
Alkaline Phosphatase: 71 U/L (ref 38–126)
Anion gap: 9 (ref 5–15)
BUN: 21 mg/dL (ref 8–23)
CO2: 26 mmol/L (ref 22–32)
Calcium: 9.1 mg/dL (ref 8.9–10.3)
Chloride: 104 mmol/L (ref 98–111)
Creatinine, Ser: 1.32 mg/dL — ABNORMAL HIGH (ref 0.44–1.00)
GFR, Estimated: 43 mL/min — ABNORMAL LOW (ref >60)
Glucose, Bld: 153 mg/dL — ABNORMAL HIGH (ref 70–99)
Potassium: 4 mmol/L (ref 3.5–5.1)
Sodium: 139 mmol/L (ref 135–145)
Total Bilirubin: 0.8 mg/dL (ref 0.3–1.2)
Total Protein: 6 g/dL — ABNORMAL LOW (ref 6.5–8.1)

## 2021-01-31 LAB — GLUCOSE, CAPILLARY: Glucose-Capillary: 117 mg/dL — ABNORMAL HIGH (ref 70–99)

## 2021-01-31 LAB — HEMOGLOBIN A1C
Hgb A1c MFr Bld: 5.5 % (ref 4.8–5.6)
Mean Plasma Glucose: 111.15 mg/dL

## 2021-01-31 LAB — TSH: TSH: 0.486 u[IU]/mL (ref 0.350–4.500)

## 2021-01-31 LAB — TROPONIN I (HIGH SENSITIVITY)
Troponin I (High Sensitivity): 469 ng/L (ref <18)
Troponin I (High Sensitivity): 530 ng/L (ref <18)

## 2021-01-31 LAB — HEPARIN LEVEL (UNFRACTIONATED): Heparin Unfractionated: 0.33 IU/mL (ref 0.30–0.70)

## 2021-01-31 MED ORDER — ASPIRIN EC 81 MG PO TBEC
81.0000 mg | DELAYED_RELEASE_TABLET | Freq: Every day | ORAL | Status: DC
  Administered 2021-01-31 – 2021-02-02 (×3): 81 mg via ORAL
  Filled 2021-01-31 (×3): qty 1

## 2021-01-31 MED ORDER — FUROSEMIDE 10 MG/ML IJ SOLN
40.0000 mg | Freq: Two times a day (BID) | INTRAMUSCULAR | Status: DC
  Administered 2021-01-31 – 2021-02-02 (×4): 40 mg via INTRAVENOUS
  Filled 2021-01-31 (×4): qty 4

## 2021-01-31 MED ORDER — HYDRALAZINE HCL 50 MG PO TABS
100.0000 mg | ORAL_TABLET | Freq: Three times a day (TID) | ORAL | Status: DC
Start: 2021-01-31 — End: 2021-02-02
  Administered 2021-01-31 – 2021-02-02 (×5): 100 mg via ORAL
  Filled 2021-01-31 (×5): qty 2

## 2021-01-31 MED ORDER — ONDANSETRON HCL 4 MG/2ML IJ SOLN: 4.0000 mg | Freq: Four times a day (QID) | INTRAMUSCULAR | Status: DC | PRN

## 2021-01-31 MED ORDER — SODIUM CHLORIDE 0.9% FLUSH
3.0000 mL | Freq: Two times a day (BID) | INTRAVENOUS | Status: DC
  Administered 2021-01-31 – 2021-02-02 (×2): 3 mL via INTRAVENOUS

## 2021-01-31 MED ORDER — METOPROLOL TARTRATE 25 MG PO TABS
25.0000 mg | ORAL_TABLET | Freq: Two times a day (BID) | ORAL | Status: DC
  Administered 2021-01-31 – 2021-02-02 (×4): 25 mg via ORAL
  Filled 2021-01-31 (×4): qty 1

## 2021-01-31 MED ORDER — ACETAMINOPHEN 325 MG PO TABS
650.0000 mg | ORAL_TABLET | ORAL | Status: DC | PRN
  Administered 2021-01-31: 650 mg via ORAL
  Filled 2021-01-31: qty 2

## 2021-01-31 MED ORDER — INSULIN ASPART 100 UNIT/ML ~~LOC~~ SOLN
0.0000 [IU] | Freq: Three times a day (TID) | SUBCUTANEOUS | Status: DC
  Administered 2021-02-01: 3 [IU] via SUBCUTANEOUS
  Administered 2021-02-01 – 2021-02-02 (×2): 2 [IU] via SUBCUTANEOUS

## 2021-01-31 MED ORDER — HYDRALAZINE HCL 25 MG PO TABS: 25.0000 mg | ORAL_TABLET | Freq: Three times a day (TID) | ORAL | Status: DC

## 2021-01-31 MED ORDER — PANTOPRAZOLE SODIUM 40 MG PO TBEC
40.0000 mg | DELAYED_RELEASE_TABLET | Freq: Every day | ORAL | Status: DC
  Administered 2021-02-01 – 2021-02-02 (×2): 40 mg via ORAL
  Filled 2021-01-31 (×2): qty 1

## 2021-01-31 MED ORDER — SODIUM CHLORIDE 0.9 % IV SOLN: 250.0000 mL | INTRAVENOUS | Status: DC | PRN

## 2021-01-31 MED ORDER — INSULIN ASPART 100 UNIT/ML ~~LOC~~ SOLN
0.0000 [IU] | Freq: Every day | SUBCUTANEOUS | Status: DC
  Administered 2021-02-01: 2 [IU] via SUBCUTANEOUS

## 2021-01-31 MED ORDER — HYDRALAZINE HCL 20 MG/ML IJ SOLN
10.0000 mg | Freq: Four times a day (QID) | INTRAMUSCULAR | Status: DC | PRN
  Filled 2021-01-31: qty 1

## 2021-01-31 MED ORDER — ISOSORBIDE DINITRATE 10 MG PO TABS
10.0000 mg | ORAL_TABLET | Freq: Three times a day (TID) | ORAL | Status: DC
  Administered 2021-01-31 – 2021-02-02 (×5): 10 mg via ORAL
  Filled 2021-01-31 (×5): qty 1

## 2021-01-31 MED ORDER — HEPARIN (PORCINE) 25000 UT/250ML-% IV SOLN
1250.0000 [IU]/h | INTRAVENOUS | Status: DC
  Administered 2021-02-01: 1200 [IU]/h via INTRAVENOUS
  Filled 2021-01-31: qty 250

## 2021-01-31 MED ORDER — ATORVASTATIN CALCIUM 40 MG PO TABS
40.0000 mg | ORAL_TABLET | Freq: Every day | ORAL | Status: DC
Start: 2021-02-01 — End: 2021-02-02
  Administered 2021-02-01 – 2021-02-02 (×2): 40 mg via ORAL
  Filled 2021-01-31 (×2): qty 1

## 2021-01-31 MED ORDER — SODIUM CHLORIDE 0.9% FLUSH
3.0000 mL | INTRAVENOUS | Status: DC | PRN
  Administered 2021-02-02: 3 mL via INTRAVENOUS

## 2021-01-31 MED ORDER — METOPROLOL TARTRATE 12.5 MG HALF TABLET: 12.5000 mg | ORAL_TABLET | Freq: Two times a day (BID) | ORAL | Status: DC

## 2021-01-31 MED ORDER — NITROGLYCERIN 0.4 MG SL SUBL: 0.4000 mg | SUBLINGUAL_TABLET | SUBLINGUAL | Status: DC | PRN

## 2021-01-31 NOTE — Progress Notes (Addendum)
ANTICOAGULATION CONSULT NOTE - Initial Consult  Pharmacy Consult for Heparin Indication: chest pain/ACS/NSTEMI  No Known Allergies  Patient Measurements: Height: 5' (152.4 cm) Weight: 94.2 kg (207 lb 10.8 oz) IBW/kg (Calculated) : 45.5 Heparin Dosing Weight: 69.3 kg  Vital Signs: Temp: 98.6 F (37 C) (03/07 1753) Temp Source: Oral (03/07 1753) BP: 169/92 (03/07 1753) Pulse Rate: 83 (03/07 1753)  Labs from [facility]:  3/6: Hgb 11.1, platelets 183  Creatinine 2.09, K+ 4.0, transaminases 13/13, t bili 0.4, albumin 3.3  Medical History: Past Medical History:  Diagnosis Date  . (HFpEF) heart failure with preserved ejection fraction (HCC)    a. 08/2020 Echo ([facility]): EF 65-70%, gr2 DD, mod dil LA, mild to mod TR. Mild PAH - . Mildly dil RA.  Marland Kitchen Asthma    Childhood  . CAD (coronary artery disease)    a. 05/2007 s/p CABG x 4 (LIMA->LAD, VG->RI->OM, VG->RPDA; b. 09/2020 MV: small, mild apical to basal inflat reversible defect. Intermediate risk. EF 62%.  . CKD (chronic kidney disease), stage III (HCC)   . Depression   . Essential hypertension   . GERD (gastroesophageal reflux disease)   . Hyperlipidemia LDL goal <70   . Postinflammatory pulmonary fibrosis (HCC)   . Pure hypercholesterolemia   . 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 Type II or unspecified type diabetes mellitus without mention of complication, not stated as uncontrolled    Assessment:  75 yr old female transferred from [facility] to [facility] [place].  Pharmacy asked to dose IV heparin for ACS/NSTEMI.        Heparin drip begun at 0127 am today with 3400 units IV bolus and running at 1080 units/hr.  Heparin level at 0.63 at 0741 this am and 0.48 at 1429.   Venipuncturist about to draw labs upon my arrival, so added heparin level to labs.  Initial heparin level likely still had some bolus effect.    Cardiology notes recent stress test (09/2020) and not currently planning additional ischemic  evaluation, provided that echo is unchanged.  Goal of Therapy:  Heparin level 0.3-0.7 units/ml Monitor platelets by anticoagulation protocol: Yes   Plan:   Continue heparin drip at 1080 units/hr  Heparin level tonight, just drawn.  Daily heparin level and CBC while on heparin.  Monitor for signs/symptoms of bleeding.  Follow up echo, plans.  Dennie Fetters, [place] 01/31/2021,8:13 PM   Addendum:   Heparin level 0.33 tonight on 1080 units/hr.   Low therapeutic but trended down.   Will increase to 1200 units/hr.   Next labs in am.  Hilarie Fredrickson, RPh 01/31/2021 9:35 PM

## 2021-01-31 NOTE — Progress Notes (Addendum)
Pt arrived from [facility]. A/Ox4; VSS; Telemetry placed; Weight taken; Ambulated to BR x1 assist; MD notified of arrival; Awaiting orders. COVID swab completed.Will continue to monitor.

## 2021-01-31 NOTE — H&P (Signed)
History & Physical    Patient ID: KERSTI SCAVONE MRN: [id number], DOB/AGE: Jan 19, 1946   Admit date: 01/31/2021   Primary Physician: Neale Burly, MD Primary Cardiologist: Carlyle Dolly, MD  Patient Profile    75 year old female with a history of coronary artery disease status post four-vessel bypass in July 2008, HFpEF, hypertension, hyperlipidemia, morbid obesity, CKD III, and type 2 diabetes mellitus, who presents on transfer from [facility] secondary to elevated troponins in the setting of presentation with altered mental status and severe hypoglycemia (blood glucose of 30).  Past Medical History    Past Medical History:  Diagnosis Date  . (HFpEF) heart failure with preserved ejection fraction ([place])    a. 08/2020 Echo ([facility]): EF 65-70%, gr2 DD, mod dil LA, mild to mod TR. Mild PAH - 49mHg. Mildly dil RA.  .Marland KitchenAsthma    Childhood  . CAD (coronary artery disease)    a. 05/2007 s/p CABG x 4 (LIMA->LAD, VG->RI->OM, VG->RPDA; b. 09/2020 MV: small, mild apical to basal inflat reversible defect. Intermediate risk. EF 62%.  . CKD (chronic kidney disease), stage III ([place]   . Depression   . Essential hypertension   . GERD (gastroesophageal reflux disease)   . Hyperlipidemia LDL goal <70   . Postinflammatory pulmonary fibrosis ([place]   . Pure hypercholesterolemia   . 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 Type II or unspecified type diabetes mellitus without mention of complication, not stated as uncontrolled     Past Surgical History:  Procedure Laterality Date  . CORONARY ARTERY BYPASS GRAFT    05/29/2007      Allergies  No Known Allergies  History of Present Illness    75year old female with above complex past medical history including coronary disease, HFpEF, hypertension, hyperlipidemia, obesity, CKD III, and type 2 diabetes mellitus (uses insulin pump).  She previously underwent diagnostic catheterization in June 2008  revealing severe multivessel coronary artery disease.  She was referred to CT surgery and subsequently underwent CABG x4 in July 2008.  She notes that she has done well over the years.  She does have chronic dyspnea on exertion in the setting of morbid obesity, deconditioning, as well as HFpEF.  [facility].  Echocardiogram during admission showed an EF of 65 to 70% with grade 2 diastolic dysfunction.  Estimated PASP was 44 mmHg.  No significant valvular abnormalities were noted.  She did have mild troponin elevation.  Following diuresis, she was discharged home and [place]  In the setting of mild troponin elevation/demand ischemia, she underwent stress testing in November 2021, which did show a small, mild intensity, apical to basal inferolateral reversible defect consistent with ischemia.  EF was 62%.  Study was felt to be of intermediate risk.  After review with the patient, she was not having any symptoms of chest pain or change in baseline level of dyspnea exertion, and conservative medical therapy was recommended.  Patient was last seen in cardiology clinic in December, at which time she was doing well.  She was in her usual state of health recently without any change in symptoms.  She does have chronic, stable dyspnea on exertion.  Due to a lack of heat in her home, she had to stay with her sister-in-law last week but returned to her own home on Friday.  She does not think she  had any dietary indiscretions while at her sister-in-law's and overall felt well.  Saturday was a normal day for her and after going to bed on Saturday evening, she has no recollection of what might of occurred on Sunday.  She believes she was asleep for the entire day.  Her family became concerned when they cannot get in touch with her and finally came to her home sometime around 7 PM.  She was found to be relatively  unresponsive and still in her bed.  EMS was called and she was found to be hypoglycemic with a glucose of 30.  She was taken to the [facility] emergency department where she was treated for hypoglycemia and her insulin pump was discontinued.  Lab work was notable for a rise in her creatinine to 2.09, which was above her baseline of 1.38 in November 2021.  High-sensitivity troponin was elevated at 632  1074 on March 6 and subsequently 1777 this morning.  Patient notes that all of her home medications have been held during her hospital stay at [facility].  She was transferred to [facility] due to concern related to non-STEMI.  Patient reiterates that she has not had any chest pain or change in dyspnea recently.  She does feel that she might be volume overloaded and has a bandlike tightness around her abdomen abdomen, which is typically where she holds her fluid.  Home Medications    Prior to Admission medications   Medication Sig Start Date End Date Taking? Authorizing Provider  aspirin 81 MG tablet Take 81 mg by mouth daily.      [provider]  atorvastatin (LIPITOR) 40 MG tablet Take 1 tablet by mouth daily. 06/15/16   [provider]  brimonidine (ALPHAGAN) 0.2 % ophthalmic solution Place 1 drop into the left eye 4 (four) times daily. 07/26/20   [provider]  Cholecalciferol (VITAMIN D) 125 MCG (5000 UT) CAPS Take by mouth daily.     [provider]  gabapentin (NEURONTIN) 300 MG capsule Take 300 mg by mouth every 3 (three) days. 07/28/20   [provider]  hydrALAZINE (APRESOLINE) 25 MG tablet Take 25 mg by mouth 3 (three) times daily. 09/28/20   [provider]  Insulin Disposable Pump (OMNIPOD 10 PACK) MISC by Does not apply route. Pump for Humalog - doses her as needed.    [provider]  isosorbide dinitrate (ISORDIL) 10 MG tablet Take 10 mg by mouth 3 (three) times daily. 09/28/20   [provider]  lisinopril  (ZESTRIL) 40 MG tablet TAKE 1/2 TABLET DAILY 12/08/20   Netta Neat., NP  metoprolol tartrate (LOPRESSOR) 25 MG tablet Take 0.5 tablets (12.5 mg total) by mouth 2 (two) times daily. 11/05/20   Netta Neat., NP  nitroGLYCERIN (NITROSTAT) 0.4 MG SL tablet DISSOLVE ONE TABLET UNDER THE TONGUE EVERY 5 MINUTES AS NEEDED FOR CHEST PAIN.  DO NOT EXCEED A TOTAL OF 3 DOSES IN 15 MINUTES 10/26/20   Branch, Dorothe Pea, MD  ofloxacin (OCUFLOX) 0.3 % ophthalmic solution Place 1 drop into the right eye 2 (two) times daily. 08/08/20   [provider]  Omega-3 Fatty Acids (OMEGA-3 FISH OIL PO) Take 1 tablet by mouth daily.    [provider]  omeprazole (PRILOSEC) 20 MG capsule Take 20 mg by mouth daily.    [provider]  OXYGEN Inhale into the lungs. As needed    [provider]  potassium chloride SA (KLOR-CON) 20  MEQ tablet Take 20 mEq by mouth daily.    [provider]  promethazine (PHENERGAN) 25 MG tablet Take 25 mg by mouth as needed. 09/25/20   [provider]  spironolactone (ALDACTONE) 25 MG tablet Take 0.5 tablets by mouth daily. 09/28/20   [provider]  torsemide (DEMADEX) 20 MG tablet Take 1 tablet (20 mg total) by mouth 2 (two) times daily. Takes 2nd dose around noon. (MAY TAKE AN EXTRA 1/2 TAB AS NEEDED FOR WEIGHT GAIN ON 3 LBS IN 24 HRS OR 5 LBS IN 1 WEEK) 10/08/20   Verta Ellen., NP  traMADol (ULTRAM) 50 MG tablet Take 1 tablet by mouth as needed.  09/15/11   [provider]    Family History    Family History  Problem Relation Age of Onset  . Cancer Other        Family Hx of Cancer  . Coronary artery disease Other        Family Hx of CAD    Social History    Social History   Socioeconomic History  . Marital status: Divorced    Spouse name: Not on file  . Number of children: Not on file  . Years of education: Not on file  . Highest education level: Not on file  Occupational History  .  Occupation: RETIRED  Tobacco Use  . Smoking status: Former Smoker    Packs/day: 1.00    Years: 34.00    Pack years: 34.00    Types: Cigarettes    Quit date: 11/27/2001    Years since quitting: 19.1  . Smokeless tobacco: Never Used  Substance and Sexual Activity  . Alcohol use: No    Alcohol/week: 0.0 standard drinks  . Drug use: No  . Sexual activity: Not on file  Other Topics Concern  . Not on file  Social History Narrative   Lives in eating by herself.  Does not routinely exercise.   Social Determinants of Health   Financial Resource Strain: Not on file  Food Insecurity: Not on file  Transportation Needs: Not on file  Physical Activity: Not on file  Stress: Not on file  Social Connections: Not on file  Intimate Partner Violence: Not on file     Review of Systems    General:  No chills, fever, night sweats or weight changes.  Cardiovascular:  No chest pain, +++ chronic dyspnea on exertion - no recent change, +++ pedal edema, no orthopnea, palpitations, paroxysmal nocturnal dyspnea. Dermatological: No rash, lesions/masses Respiratory: No cough, +++ chronic and stable dyspnea on exertion Urologic: No hematuria, dysuria Abdominal:   No nausea, vomiting, diarrhea, bright red blood per rectum, melena, or hematemesis Neurologic:  +++ AMS/Wkns in setting of hypoglycemia.  Pt has no recollection of events on 3/6.  No visual changes, wkns, changes in mental status. All other systems reviewed and are otherwise negative except as noted above.  Physical Exam    Blood pressure (!) 169/92, pulse 83, temperature 98.6 F (37 C), temperature source Oral, resp. rate (!) 22, height 5' (1.524 m), weight 94.2 kg, SpO2 100 %.  General: Pleasant, NAD Psych: Normal affect. Neuro: Alert and oriented X 3. Moves all extremities spontaneously. HEENT: Normal  Neck: Supple without bruits.  JVP to jaw.. Lungs:  Resp regular and unlabored. Diminished @ R base - improved some w/ deep  inspiration. Heart: RRR no s3, s4, or murmurs. Abdomen: Obese, soft, non-tender, non-distended, BS + x 4.  Extremities: No clubbing, cyanosis.  Trace bilat ankle edema w/ 1+ bilat pedal edema. DP/PT 1+, Radials 2+ and equal bilaterally.  Labs    Labs from [facility] 01/30/2021  Hemoglobin 11.1, adequate 35 1, platelets 183, WBC 8.8 Sodium 138, potassium 4.0, chloride 100, CO2 29.1, BUN 33, Creat 2.09, gluc 153 Alk phos 90, Tbili 0.4, AST 13, ALT 13 Ca2+ 8.8, T protein 6.9 HsTrop 632  1074  1777 (01/31/21 @ 05:19)    Radiology Studies    CXR 01/30/2021 [facility] [place]  FINDINGS: Multiple sternal wires and vascular clips are seen. Predominant stable, mild to moderate severity diffusely increased interstitial lung markings are noted. There is no evidence of a pleural effusion or pneumothorax. The cardiac silhouette is moderately enlarged. There is marked severity calcification of the aortic arch. The visualized skeletal structures are unremarkable. _____________  ECG & Cardiac Imaging    Regular sinus rhythm, 82, PVC- personally reviewed.  Assessment & Plan    1.  Profound hypoglycemia in the setting of type 2 diabetes mellitus with insulin pump: Patient is unaware of any change in her diet or insulin dosing prior to admission however, she was found by family on Sunday evening unresponsive and eventually found to have a glucose of 30 by EMS.  She was taken to the [facility] ED.  Her insulin pump was discontinued and she was treated for hyperglycemia with resolution of altered mental status.  In the setting of profound hypoglycemia, she was found to have a rise in creatinine to 2.09 and troponin elevation to a peak of 1777 this morning.  Patient has not had any chest pain or change in her baseline level of dyspnea prior to hospitalization.  We will need to ask the hospitalist service in the morning to evaluate the patient's home insulin dosing in order to prevent this from occurring  again.  2.  Demand ischemia/elevated troponin/CAD: Status post CABG x4 in 2008 with recent intermediate risk stress test in November 2021 (small, mild intensity, apical to basal inferolateral reversible defect consistent with ischemia - EF 62%).  In the absence of symptoms, she was medically managed following stress testing.  In the setting of above, patient was found to have a rising troponin to a peak of 1777 this morning.  As above, she has not been experiencing any chest pain or change in baseline level of dyspnea prior to her hospitalization.  Suspect this is demand ischemia and not consistent with ACS.  We will follow-up echocardiogram to reevaluate LV function and rule out new wall motion abnormalities.  In light of elevated creatinine and recent stress testing, provided that echo unchanged, would not pursue additional ischemic evaluation at this time.  Continue home doses of aspirin, statin, beta-blocker, and nitrate.  Continue heparin tonight.  Will hold ACE inhibitor in the setting of elevated creatinine at this time.  3.  Acute on chronic heart failure with preserved ejection fraction: The patient was not feeling volume overloaded prior to hospitalization, she now feels more volume overloaded and has evidence of volume overload on exam with elevated JVD, and mild lower extremity swelling.  We will diurese while here and follow renal function closely in the setting of mild rising creatinine at outside hospital.  We will titrate hydralazine and nitrate dosing as ACE and spironolactone are currently on hold.  Continue beta-blocker.  4.  Essential hypertension: Blood pressure elevated on arrival here.  Holding ACE inhibitor and MRA.  Continue beta-blocker and titrate hydralazine and nitrate.  5.  Hyperlipidemia: Continue home  statin therapy.  6.  Acute on chronic stage III kidney disease: Creatinine elevated at 2.09, which is above prior baseline of 1.38 in November.  Follow in the setting of IV  diuresis.  Holding ACE inhibitor and MRA for the time being.  Signed, Murray Hodgkins, NP 01/31/2021, 7:28 PM

## 2021-02-01 ENCOUNTER — Encounter (HOSPITAL_COMMUNITY): Payer: Self-pay | Admitting: Cardiovascular Disease

## 2021-02-01 ENCOUNTER — Inpatient Hospital Stay (HOSPITAL_COMMUNITY): Payer: Medicare Other

## 2021-02-01 ENCOUNTER — Other Ambulatory Visit: Payer: Self-pay

## 2021-02-01 DIAGNOSIS — I214 Non-ST elevation (NSTEMI) myocardial infarction: Secondary | ICD-10-CM | POA: Diagnosis not present

## 2021-02-01 LAB — LIPID PANEL
Cholesterol: 101 mg/dL (ref 0–200)
HDL: 43 mg/dL (ref >40)
LDL Cholesterol: 46 mg/dL (ref 0–99)
Total CHOL/HDL Ratio: 2.3 RATIO
Triglycerides: 61 mg/dL (ref <150)
VLDL: 12 mg/dL (ref 0–40)

## 2021-02-01 LAB — CBC
HCT: 29.9 % — ABNORMAL LOW (ref 36.0–46.0)
Hemoglobin: 9.7 g/dL — ABNORMAL LOW (ref 12.0–15.0)
MCH: 28.7 pg (ref 26.0–34.0)
MCHC: 32.4 g/dL (ref 30.0–36.0)
MCV: 88.5 fL (ref 80.0–100.0)
Platelets: 185 10*3/uL (ref 150–400)
RBC: 3.38 MIL/uL — ABNORMAL LOW (ref 3.87–5.11)
RDW: 14.3 % (ref 11.5–15.5)
WBC: 11.3 10*3/uL — ABNORMAL HIGH (ref 4.0–10.5)
nRBC: 0 % (ref 0.0–0.2)

## 2021-02-01 LAB — GLUCOSE, CAPILLARY
Glucose-Capillary: 140 mg/dL — ABNORMAL HIGH (ref 70–99)
Glucose-Capillary: 139 mg/dL — ABNORMAL HIGH (ref 70–99)
Glucose-Capillary: 162 mg/dL — ABNORMAL HIGH (ref 70–99)
Glucose-Capillary: 149 mg/dL — ABNORMAL HIGH (ref 70–99)
Glucose-Capillary: 160 mg/dL — ABNORMAL HIGH (ref 70–99)

## 2021-02-01 LAB — BASIC METABOLIC PANEL
Anion gap: 8 (ref 5–15)
BUN: 22 mg/dL (ref 8–23)
CO2: 30 mmol/L (ref 22–32)
Calcium: 9.2 mg/dL (ref 8.9–10.3)
Chloride: 102 mmol/L (ref 98–111)
Creatinine, Ser: 1.32 mg/dL — ABNORMAL HIGH (ref 0.44–1.00)
GFR, Estimated: 43 mL/min — ABNORMAL LOW (ref >60)
Glucose, Bld: 162 mg/dL — ABNORMAL HIGH (ref 70–99)
Potassium: 3.9 mmol/L (ref 3.5–5.1)
Sodium: 140 mmol/L (ref 135–145)

## 2021-02-01 LAB — ECHOCARDIOGRAM COMPLETE
Area-P 1/2: 3.53 cm2
Height: 60 in
S' Lateral: 2.9 cm
Weight: 3298.08 oz

## 2021-02-01 LAB — HEPARIN LEVEL (UNFRACTIONATED): Heparin Unfractionated: 0.35 IU/mL (ref 0.30–0.70)

## 2021-02-01 LAB — SARS CORONAVIRUS 2 (TAT 6-24 HRS): SARS Coronavirus 2: NEGATIVE

## 2021-02-01 MED ORDER — ALPRAZOLAM 0.25 MG PO TABS
0.2500 mg | ORAL_TABLET | Freq: Four times a day (QID) | ORAL | Status: DC | PRN
  Administered 2021-02-01 – 2021-02-02 (×3): 0.25 mg via ORAL
  Filled 2021-02-01 (×3): qty 1

## 2021-02-01 MED ORDER — ZOLPIDEM TARTRATE 5 MG PO TABS
5.0000 mg | ORAL_TABLET | Freq: Every evening | ORAL | Status: DC | PRN
  Filled 2021-02-01: qty 1

## 2021-02-01 NOTE — Consult Note (Signed)
Consult Note   [name] YKD:[id number] DOB: January 16, 1946 DOA: 01/31/2021  PCP: Neale Burly, MD Consultants:  Harl Bowie - cardiology Patient coming from:  Home - lives alone; NOK: Tanya, Archer, [phone]  Chief Complaint:  Found down  HPI: [name] is a 75 y.o. female with medical history significant of DM; HLD; pulmonary fibrosis; HTN; goiter; stage 3 CKD; HFpEF; and CAD s/p CABG who was admitted on 3/7 as a transfer from [facility] with NSTEMI in the setting of severe hypoglycemia (glucose 30).  The patient reports that she was given a "pod" insulin pump and met with the drug reps to start it off and does not appear too have had f/u since.  She has been noticing periodic lows particularly overnight and in the AM.  She went to bed on 3/5 and the next thing she remembers is having EMS there about 7pm on 3/6.  Apparently, her niece tried to call in the AM and then again in the afternoon and finally decided to go over and check on her.  She was generally unresponsive.  Her pump has been removed and she has no current complaints other than wanting to go home.  She does not currently have CP and has not had CP throughout the situation.    ED Course:  Found down after 24 hours, taken to [facility].  Significant hypoglycemia.  Also in heart failure and NSTEMI vs. Demand ischemia.  Echo is pending - if abnormal, needs heart cath.  On insulin pump.  Has h/o CAD s/p CABG.  Needs help with diabetes management and also HTN medications.  Review of Systems: As per HPI; otherwise review of systems reviewed and negative.   Ambulatory Status:  Ambulates without assistance  Past Medical History:  Diagnosis Date  . (HFpEF) heart failure with preserved ejection fraction ([place])    a. 08/2020 Echo ([facility]): EF 65-70%, gr2 DD, mod dil LA, mild to mod TR. Mild PAH - 23mHg. Mildly dil RA.  .Marland KitchenAsthma    Childhood  . CAD (coronary artery disease)    a. 05/2007 s/p CABG x 4 (LIMA->LAD, VG->RI->OM,  VG->RPDA; b. 09/2020 MV: small, mild apical to basal inflat reversible defect. Intermediate risk. EF 62%.  . CKD (chronic kidney disease), stage III ([facility]   . Depression   . Essential hypertension   . GERD (gastroesophageal reflux disease)   . Hyperlipidemia LDL goal <70   . Postinflammatory pulmonary fibrosis ([place]   . 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 Type II or unspecified type diabetes mellitus without mention of complication, not stated as uncontrolled     Past Surgical History:  Procedure Laterality Date  . CORONARY ARTERY BYPASS GRAFT    05/29/2007     Social History   Socioeconomic History  . Marital status: Divorced    Spouse name: Not on file  . Number of children: Not on file  . Years of education: Not on file  . Highest education level: Not on file  Occupational History  . Occupation: RETIRED  Tobacco Use  . Smoking status: Former Smoker    Packs/day: 1.00    Years: 34.00    Pack years: 34.00    Types: Cigarettes    Quit date: 11/27/2001    Years since quitting: 19.1  . Smokeless tobacco: Never Used  Substance and Sexual Activity  . Alcohol use: No    Alcohol/week: 0.0 standard drinks  . Drug use: No  .  Sexual activity: Not on file  Other Topics Concern  . Not on file  Social History Narrative   Lives in eating by herself.  Does not routinely exercise.   Social Determinants of Health   Financial Resource Strain: Not on file  Food Insecurity: Not on file  Transportation Needs: Not on file  Physical Activity: Not on file  Stress: Not on file  Social Connections: Not on file  Intimate Partner Violence: Not on file    No Known Allergies  Family History  Problem Relation Age of Onset  . Cancer Other        Family Hx of Cancer  . Coronary artery disease Other        Family Hx of CAD    Prior to Admission medications   Medication Sig Start Date End Date Taking? Authorizing Provider  aspirin 81 MG tablet Take  81 mg by mouth daily.     Yes [provider]  atorvastatin (LIPITOR) 40 MG tablet Take 1 tablet by mouth daily. 06/15/16  Yes [provider]  brimonidine (ALPHAGAN) 0.2 % ophthalmic solution Place 1 drop into the left eye 4 (four) times daily. 07/26/20  Yes [provider]  Cholecalciferol (VITAMIN D) 125 MCG (5000 UT) CAPS Take by mouth daily.    Yes [provider]  gabapentin (NEURONTIN) 300 MG capsule Take 300 mg by mouth every 3 (three) days. 07/28/20  Yes [provider]  hydrALAZINE (APRESOLINE) 25 MG tablet Take 25 mg by mouth 3 (three) times daily. 09/28/20  Yes [provider]  Insulin Disposable Pump (OMNIPOD 10 PACK) MISC by Does not apply route. Pump for Humalog - doses her as needed.   Yes [provider]  isosorbide dinitrate (ISORDIL) 10 MG tablet Take 10 mg by mouth 3 (three) times daily. 09/28/20  Yes [provider]  lisinopril (ZESTRIL) 40 MG tablet TAKE 1/2 TABLET DAILY 12/08/20  Yes Verta Ellen., NP  metoprolol tartrate (LOPRESSOR) 25 MG tablet Take 0.5 tablets (12.5 mg total) by mouth 2 (two) times daily. 11/05/20  Yes Verta Ellen., NP  nitroGLYCERIN (NITROSTAT) 0.4 MG SL tablet DISSOLVE ONE TABLET UNDER THE TONGUE EVERY 5 MINUTES AS NEEDED FOR CHEST PAIN.  DO NOT EXCEED A TOTAL OF 3 DOSES IN 15 MINUTES 10/26/20  Yes Branch, Alphonse Guild, MD  ofloxacin (OCUFLOX) 0.3 % ophthalmic solution Place 1 drop into the right eye 2 (two) times daily. 08/08/20  Yes [provider]  Omega-3 Fatty Acids (OMEGA-3 FISH OIL PO) Take 1 tablet by mouth daily.   Yes [provider]  omeprazole (PRILOSEC) 20 MG capsule Take 20 mg by mouth daily.   Yes [provider]  OXYGEN Inhale into the lungs. As needed   Yes [provider]  potassium chloride SA (KLOR-CON) 20 MEQ tablet Take 20 mEq by mouth daily.   Yes [provider]  promethazine (PHENERGAN) 25 MG tablet Take 25 mg by  mouth as needed. 09/25/20  Yes [provider]  spironolactone (ALDACTONE) 25 MG tablet Take 0.5 tablets by mouth daily. 09/28/20  Yes [provider]  torsemide (DEMADEX) 20 MG tablet Take 1 tablet (20 mg total) by mouth 2 (two) times daily. Takes 2nd dose around noon. (MAY TAKE AN EXTRA 1/2 TAB AS NEEDED FOR WEIGHT GAIN ON 3 LBS IN 24 HRS OR 5 LBS IN 1 WEEK) 10/08/20  Yes Verta Ellen., NP  traMADol (ULTRAM) 50 MG tablet Take 1 tablet  by mouth as needed.  09/15/11  Yes [provider]    Physical Exam: Vitals:   02/01/21 0318 02/01/21 0757 02/01/21 1209 02/01/21 1603  BP: (!) 158/61 (!) 151/44 (!) 155/101 (!) 157/66  Pulse: 66 66 74 71  Resp: '18 18 20 20  ' Temp: (!) 97.3 F (36.3 C) 98.5 F (36.9 C) 98 F (36.7 C) 99 F (37.2 C)  TempSrc: Oral Oral Oral Oral  SpO2: 98% 100% 97% 98%  Weight:      Height:         . General:  Appears calm and comfortable and is in NAD; emotionally labile when talking about wanting to go home . Eyes:   EOMI, normal lids, iris . ENT:  grossly normal hearing, lips & tongue, mmm . Neck:  no LAD, masses or thyromegaly . Cardiovascular:  RRR, no m/r/g. 1-2+ LE edema.  Marland Kitchen Respiratory:   CTA bilaterally with no wheezes/rales/rhonchi.  Normal respiratory effort. . Abdomen:  soft, NT, ND, NABS . Skin:  no rash or induration seen on limited exam . Musculoskeletal:  grossly normal tone BUE/BLE, good ROM, no bony abnormality . Psychiatric:  Mildly anxious/depressed mood and affect, speech fluent and appropriate, AOx3 . Neurologic:  CN 2-12 grossly intact, moves all extremities in coordinated fashion    Radiological Exams on Admission: Independently reviewed - see discussion in A/P where applicable  DG Chest Port 1 View  Result Date: 02/01/2021 CLINICAL DATA:  Shortness of breath. EXAM: PORTABLE CHEST 1 VIEW COMPARISON:  06/21/2007. FINDINGS: Prior CABG. Heart size normal. Low lung volumes. No focal infiltrate. No pleural  effusion or pneumothorax. No acute bony abnormality. IMPRESSION: Prior CABG. Low lung volumes. No acute cardiopulmonary disease. Exam stable from prior exam. Electronically Signed   By: Marcello Moores  Register   On: 02/01/2021 05:25   ECHOCARDIOGRAM COMPLETE  Result Date: 02/01/2021    ECHOCARDIOGRAM REPORT   Patient Name:   [name] Date of Exam: 02/01/2021 Medical Rec #:  [id number]           Height:       60.0 in Accession #:    [id number]          Weight:       206.1 lb Date of Birth:  Oct 19, 1946           BSA:          1.891 m Patient Age:    5 years            BP:           155/101 mmHg Patient Gender: F                   HR:           66 bpm. Exam Location:  Inpatient Procedure: 2D Echo Indications:    NSTEMI  History:        Patient has prior history of Echocardiogram examinations, most                 recent 07/10/2019. Prior CABG, chronic kidney disease; Risk                 Factors:Diabetes and Hypertension.  Sonographer:    Johny Chess Referring Phys: [id number]  1. Left ventricular ejection fraction, by estimation, is 60 to 65%. The left ventricle has normal function. The left ventricle has no regional wall motion abnormalities. Left ventricular diastolic parameters are consistent with Grade II diastolic  dysfunction (pseudonormalization).  2. Right ventricular systolic function is normal. The right ventricular size is normal. There is normal pulmonary artery systolic pressure.  3. The mitral valve is normal in structure. No evidence of mitral valve regurgitation. No evidence of mitral stenosis.  4. The aortic valve is normal in structure. Aortic valve regurgitation is not visualized. No aortic stenosis is present. FINDINGS  Left Ventricle: Left ventricular ejection fraction, by estimation, is 60 to 65%. The left ventricle has normal function. The left ventricle has no regional wall motion abnormalities. The left ventricular internal cavity size was normal in size.  There is  no left ventricular hypertrophy. Left ventricular diastolic parameters are consistent with Grade II diastolic dysfunction (pseudonormalization). Right Ventricle: The right ventricular size is normal. No increase in right ventricular wall thickness. Right ventricular systolic function is normal. There is normal pulmonary artery systolic pressure. The tricuspid regurgitant velocity is 2.86 m/s, and  with an assumed right atrial pressure of 3 mmHg, the estimated right ventricular systolic pressure is 01.6 mmHg. Left Atrium: Left atrial size was normal in size. Right Atrium: Right atrial size was normal in size. Pericardium: There is no evidence of pericardial effusion. Mitral Valve: The mitral valve is normal in structure. Mild mitral annular calcification. No evidence of mitral valve regurgitation. No evidence of mitral valve stenosis. Tricuspid Valve: The tricuspid valve is normal in structure. Tricuspid valve regurgitation is mild. Aortic Valve: The aortic valve is normal in structure. Aortic valve regurgitation is not visualized. No aortic stenosis is present. Pulmonic Valve: The pulmonic valve was normal in structure. Pulmonic valve regurgitation is not visualized. Aorta: The aortic root and ascending aorta are structurally normal, with no evidence of dilitation. IAS/Shunts: The atrial septum is grossly normal.  LEFT VENTRICLE PLAX 2D LVIDd:         4.40 cm  Diastology LVIDs:         2.90 cm  LV e' medial:    6.31 cm/s LV PW:         1.00 cm  LV E/e' medial:  18.2 LV IVS:        0.90 cm  LV e' lateral:   9.90 cm/s LVOT diam:     1.80 cm  LV E/e' lateral: 11.6 LV SV:         82 LV SV Index:   43 LVOT Area:     2.54 cm  RIGHT VENTRICLE             IVC RV S prime:     10.20 cm/s  IVC diam: 2.10 cm TAPSE (M-mode): 1.8 cm LEFT ATRIUM             Index       RIGHT ATRIUM           Index LA diam:        4.80 cm 2.54 cm/m  RA Area:     12.40 cm LA Vol (A2C):   59.6 ml 31.52 ml/m RA Volume:   28.00 ml  14.81  ml/m LA Vol (A4C):   53.1 ml 28.08 ml/m LA Biplane Vol: 56.8 ml 30.04 ml/m  AORTIC VALVE LVOT Vmax:   126.00 cm/s LVOT Vmean:  86.900 cm/s LVOT VTI:    0.323 m  AORTA Ao Asc diam: 2.90 cm MITRAL VALVE                TRICUSPID VALVE MV Area (PHT): 3.53 cm     TR Peak grad:   32.7 mmHg  MV Decel Time: 215 msec     TR Vmax:        286.00 cm/s MV E velocity: 115.00 cm/s MV A velocity: 67.40 cm/s   SHUNTS MV E/A ratio:  1.71         Systemic VTI:  0.32 m                             Systemic Diam: 1.80 cm Mertie Moores MD Electronically signed by Mertie Moores MD Signature Date/Time: 02/01/2021/1:46:43 PM    Final     EKG: Independently reviewed.  NSR with rate 78; nonspecific ST changes with no evidence of acute ischemia   Labs on Admission: I have personally reviewed the available labs and imaging studies at the time of the admission.  Pertinent labs:   Glucose at [facility]: 153, 117, 162, 140 BUN 22/Creatinine 1.32/GFR 43 BNP 752.4 HS troponin 469, 530 WBC 11.3 Hgb 9.7 A1c 5.5 TSH 0.486   Assessment/Plan Principal Problem:   NSTEMI (non-ST elevated myocardial infarction) (HCC) Active Problems:   Essential hypertension   CORONARY ARTERY BYPASS GRAFT, HX OF   Acute on chronic diastolic heart failure (HCC)   Type 2 diabetes mellitus with complication, with long-term current use of insulin (HCC)   Obesity, Class III, BMI 40-49.9 (morbid obesity) (HCC)    NSTEMI vs demand ischemia in the setting of profound hypoglycemia -Prior h/o CABG -Elevated troponin -No CP throughout -Nonspecific EKG findings -Management per cardiology -Echo today and if no WMA then probably home without intervention tomorrow -Continue Isordil  Hypoglycemia in the setting of DM with insulin pump -Patient with recent A1c of 5.5 - indicating probable overzealous control -Patient also reports having recurrent episodes of hypoglycemia at home and was found with glucose as low as 30 -D/c pump for now -Continue low-dose  Lantus with SSI -If insulin pump is desired as an outpatient, recommend endocrinology referral with close outpatient f/u and continuous glucose monitoring -Diabetes coordinator also consulted  HTN -Home medications include: Hydralazine, Lisinopril, Lopressor -It is reasonable to continue these medications -Lisinopril is not currently ordered - possibly to allow for transition to [facility]? -Currently suboptimally controlled BP may be associated with this  Chronic CHF -Echo today with preserved EF, no WMA, grade 2 diastolic dysfunction -Continue ACE (or ARB/Entresto), BB -It should be reasonable to resume diuretics  HLD -Continue Lipitor  Stage 3b CKD -Appears to be stable at this time -Likely ok to resume diuretics and ACE/ARB  Obesity -Body mass index is 40.26 kg/m..  -Weight loss should be encouraged -Outpatient PCP/bariatric medicine f/u encouraged    Thank you for this interesting consult.  The patient appears to be stable at this time.  TR[facility] will sign off.  Please call with any additional questions or concerns.     Karmen Bongo MD [facility]   How to contact the [facility] Attending or Consulting provider Ward or covering provider during after hours [place], for this patient?  1. Check the care team in [facility] and look for a) attending/consulting [facility] provider listed and b) the [facility] team listed 2. Log into [email or web address] and use 's universal password to access. If you do not have the password, please contact the hospital operator. 3. Locate the [facility] provider you are looking for under [facility] and page to a number that you can be directly reached. 4. If you still have difficulty reaching the provider, please page the [facility] (  Director on Call) for the Hospitalists listed on amion for assistance.   02/01/2021, 6:02 PM

## 2021-02-01 NOTE — Progress Notes (Signed)
Inpatient Diabetes Program Recommendations  AACE/ADA: New Consensus Statement on Inpatient Glycemic Control   Target Ranges:  Prepandial:   less than 140 mg/dL      Peak postprandial:   less than 180 mg/dL (1-2 hours)      Critically ill patients:  140 - 180 mg/dL   Results for [name], [name] (MRN [id number]) as of 02/01/2021 14:44  Ref. Range 01/31/2021 20:51 02/01/2021 06:03 02/01/2021 12:10  Glucose-Capillary Latest Ref Range: 70 - 99 mg/dL 702 (H) 637 (H) 858 (H)  Results for [name], [name] (MRN [id number]) as of 02/01/2021 14:44  Ref. Range 01/31/2021 20:05  Hemoglobin A1C Latest Ref Range: 4.8 - 5.6 % 5.5   Review of Glycemic Control  Diabetes history: DM2 Outpatient Diabetes medications: OmniPod insulin pump with Humalog Current orders for Inpatient glycemic control: Novolog 0-15 units TID, Novolog 0-5 units QHS  Inpatient Diabetes Program Recommendations:   Outpatient DM control: Would recommend that patient not resume her insulin pump at discharge. Would recommend that she be prescribed SQ insulin (insulin pens). If prescribed basal insulin, would recommend prescribing Lantus at a very low dose.  NOTE: Noted consult for diabetes coordinator. Spoke with patient regarding diabetes and home regimen for diabetes management.  Patient states that she is followed by PCP for diabetes management.  Patient reports that she is using an OmniPod insulin pump with Humalog insulin. Patient reports that she has been on the pump for 3 years and prior to that she was using Lantus and Humalog injections.  Patient states that she manages her own insulin pump. When asked about any changes with her insulin pump, patient states that no changes have been made within a year or so. Patient states when she last needed insulin pump changes due to hypoglycemia, she contacted Cristal Deer (OmniPod representative at [phone]).  Patient states that her PCP reviews her A1C and has been telling her she is doing  great with DM control.  Patient reports that she remembers checking glucose on Saturday night before she ate supper (ate TV dinner) and glucose was 97 at 6:34 on 01/29/21 (per glucometer review). Patient states that she does not remember anything else until Sunday evening when she was "brought to by people in her room".   Patient states that she is having hypoglycemia several times a week and often wakes up with glucose of 40-50's mg/dl. Patient also notes that she has to eat throughout the day to keep her glucose up. Explained that if basal rates are set correctly, then it should keep glucose controlled without her having to eat just to keep it up.  Patient has PDA device for OmniPod in her belongings in the closet. Retreived patient's bag and patient got out PDA device and allowed me to review.   Current insulin pump settings are as follows:  Basal insulin  12A 1.5 units/hour Total daily basal insulin: 36 units/24 hours  Carb Coverage 1:8 1 unit for every 8 grams of carbohydrates  Insulin Sensitivity 1:40 1 unit drops blood glucose 40 mg/dl  Patient reports her last A1C was 5.9%.  Discussed current A1C of 5.5% indicating an average glucose of 111 mg/dl over the past 2-3 months. In reviewing patient's home glucometer, patient is checking glucose 3-4 times a day and her glucose has ranged from 48-201 mg/dl over the past few days prior to admission with multiple glucose values less than 70 mg/dl. Explained that if she is having to eat to keep glucose from going too low, she  is likely on too much basal insulin. Encouraged patient to get established with an Endocrinologist (Dr. Fransico Him is in [place]) to assist with DM control and pump management. Patient states that she has never seen an Endocrinologist but would be willing to see one. Discussed continuous glucose monitoring sensors and encouraged patient to ask PCP about prescribing for her.  Patient states she has an appointment with PCP next week.  Informed patient that it would be recommended that she not resume her insulin pump at discharge but SQ insulin would be recommended. Explained it would be up to providers as to whether they would continue pump or not. Patient has used Lantus in the past and would prefer to use insulin pens if prescribed at discharge. Explained that since she has arrived at [facility] her glucose has ranged from 117-140 mg/dl and she has gotten very little Novolog insulin for correction.   Patient verbalized understanding of information discussed and states that she does not have any further questions related to diabetes at this time.  Thanks, Orlando Penner, RN, MSN, CDE Diabetes Coordinator Inpatient Diabetes Program [phone] (Team Pager from 8am to 5pm)

## 2021-02-01 NOTE — Progress Notes (Addendum)
Progress Note  Patient Name: [name] Date of Encounter: 02/01/2021  [facility] Cardiologist: Dina Rich, MD   Subjective   Anxiety is a little better after the Xanax, still very jittery. Anxious about being in the hospital, not being at home w/ family Had an episode like this once before, it finally resolved w/out intervention. Has been SOB since this happened, no hx of this  Inpatient Medications    Scheduled Meds: . aspirin EC  81 mg Oral Daily  . atorvastatin  40 mg Oral Daily  . furosemide  40 mg Intravenous BID  . hydrALAZINE  100 mg Oral TID  . insulin aspart  0-15 Units Subcutaneous TID WC  . insulin aspart  0-5 Units Subcutaneous QHS  . isosorbide dinitrate  10 mg Oral TID  . metoprolol tartrate  25 mg Oral BID  . pantoprazole  40 mg Oral Daily  . sodium chloride flush  3 mL Intravenous Q12H   Continuous Infusions: . sodium chloride    . heparin 1,250 Units/hr (02/01/21 0859)   PRN Meds: sodium chloride, acetaminophen, ALPRAZolam, hydrALAZINE, nitroGLYCERIN, ondansetron (ZOFRAN) IV, sodium chloride flush, zolpidem   Vital Signs    Vitals:   02/01/21 0000 02/01/21 0256 02/01/21 0318 02/01/21 0757  BP:   (!) 158/61 (!) 151/44  Pulse:   66 66  Resp:   18 18  Temp:   (!) 97.3 F (36.3 C) 98.5 F (36.9 C)  TempSrc:   Oral Oral  SpO2:   98% 100%  Weight: 93.5 kg 93.5 kg    Height:        Intake/Output Summary (Last 24 hours) at 02/01/2021 1034 Last data filed at 02/01/2021 5170 Gross per 24 hour  Intake 70.15 ml  Output 850 ml  Net -779.85 ml   Last 3 Weights 02/01/2021 02/01/2021 01/31/2021  Weight (lbs) 206 lb 2.1 oz 206 lb 2.1 oz 207 lb 10.8 oz  Weight (kg) 93.5 kg 93.5 kg 94.2 kg      Telemetry    SR - Personally Reviewed  ECG    None today - Personally Reviewed  Physical Exam   GEN: No acute distress.   Neck: JVD approx 9 cm Cardiac: RRR, no murmurs, rubs, or gallops.  Respiratory: decreased BS bases bilaterally. GI:  Soft, nontender, non-distended  MS: No edema; No deformity. Neuro:  Nonfocal  Psych: Normal affect   Labs    High Sensitivity Troponin:   Recent Labs  Lab 01/31/21 2005 01/31/21 2121  TROPONINIHS 469* 530*      Chemistry Recent Labs  Lab 01/31/21 2005 02/01/21 0327  NA 139 140  K 4.0 3.9  CL 104 102  CO2 26 30  GLUCOSE 153* 162*  BUN 21 22  CREATININE 1.32* 1.32*  CALCIUM 9.1 9.2  PROT 6.0*  --   ALBUMIN 3.3*  --   AST 14*  --   ALT 13  --   ALKPHOS 71  --   BILITOT 0.8  --   GFRNONAA 43* 43*  ANIONGAP 9 8     Hematology Recent Labs  Lab 01/31/21 2005 02/01/21 0327  WBC 9.7 11.3*  RBC 3.55* 3.38*  HGB 10.0* 9.7*  HCT 31.5* 29.9*  MCV 88.7 88.5  MCH 28.2 28.7  MCHC 31.7 32.4  RDW 14.0 14.3  PLT 183 185    BNP Recent Labs  Lab 01/31/21 2005  BNP 752.4*    Lab Results  Component Value Date   CHOL  05/22/2007  150        ATP III CLASSIFICATION:  <200     mg/dL   Desirable  025-427  mg/dL   Borderline High  >=062    mg/dL   High   HDL 34 (L) 37/62/8315   LDLCALC  05/22/2007    70        Total Cholesterol/HDL:CHD Risk Coronary Heart Disease Risk Table                     Men   Women  1/2 Average Risk   3.4   3.3   TRIG 230 (H) 05/22/2007   CHOLHDL 4.4 05/22/2007   Lab Results  Component Value Date   TSH 0.486 01/31/2021   Lab Results  Component Value Date   HGBA1C 5.5 01/31/2021    DDimer No results for input(s): DDIMER in the last 168 hours.   Radiology    DG Chest Port 1 View  Result Date: 02/01/2021 CLINICAL DATA:  Shortness of breath. EXAM: PORTABLE CHEST 1 VIEW COMPARISON:  06/21/2007. FINDINGS: Prior CABG. Heart size normal. Low lung volumes. No focal infiltrate. No pleural effusion or pneumothorax. No acute bony abnormality. IMPRESSION: Prior CABG. Low lung volumes. No acute cardiopulmonary disease. Exam stable from prior exam. Electronically Signed   By: Maisie Fus  Register   On: 02/01/2021 05:25    Cardiac Studies    ECHO: pending  Patient Profile     75 y.o. female with history of CAD status post CABG 2008, CKD stage III, type 2 DM, HTN, HLD, CKD III, D-CHF, was admitted from [facility] for NSTEMI and CHF in the setting of prolonged, severe hypoglycemia.   Assessment & Plan    1. NSTEMI - hx CABG 2008, low-intermediate MV 09/2020 - No chest pain at all - ez here lower than peak reported at [facility] of 1777 - Echo ordered, to be done now - if +WMA or decreased EF, needs cath - if EF normal w/ no WMA, med rx for demand ischemia from known CAD - continue heparin, ASA, BB, high-dose statin, oral nitrates  2. HTN - BP elevated here  - metoprolol increased from 12.5 >> 25 mg bid, HR 60s - hydralazine increased from 25 mg >> 100 mg tid - Isordil 10 mg tid no change - aldactone 12.5 mg qd, lisinopril 20 mg qd and Demadex 20 mg bid are on hold due to decreased renal function and possible need for cath - SBP 150s-170s, DBP 40s- 60s this am - review echo results before deciding on med changes  3. Acute on chronic diastolic CHF - pt had prn O2 at home, was not using it - had prn Demadex 1/2 tab to use in setting of wt gain, had not done so recently - wt 206 lbs, no sig change from baseline - still SOB and +volume overload by exam, continue diuresis for now  4. HLD - on Lipitor 40 mg qd, home dose - ck profile  Principal Problem:   NSTEMI (non-ST elevated myocardial infarction) (HCC) Active Problems:   Essential hypertension   CORONARY ARTERY BYPASS GRAFT, HX OF   Acute on chronic diastolic heart failure (HCC)   Type 2 diabetes mellitus with complication, with long-term current use of insulin (HCC)    For questions or updates, please contact [facility] Please consult [email or web address] for contact info under        Signed, Theodore Demark, PA-C  02/01/2021, 10:34 AM    I have seen and examined the  patient along with Theodore Demark, PA-C , PA NP.  I have reviewed the chart, notes and new data.   I agree with PA/NP's note.  Key new complaints: denies angina - never had any. Has been "fighting fluid for weeks" despite torsemide 40 mg daily. Has had recurrent early morning hypoglycemia. A1c was last 5.9% Key examination changes: a few bilateral wheezes and plump JVP, otw normal CV exam Key new findings / data: echo shows normal LV regional wall motion and EF 60%, but pseudonormal mitral inflow c/w increased mean left atrial pressure.  PLAN:  Mild increase in troponin is c/w demand myocardial injury in the setting of severe and prolonged hypoglycemia, but echo and ECG do not show evidence of a true acute coronary event. She does have evidence of acute exacerbation of diastolic HF.   Give a dose of IV diuretics. Increase daily torsemide to 60 mg daily. Review weight monitoring and sodium restricted diet. Needs to have a review of her DM treatment to avoid hypoglycemia.  Thurmon Fair, MD, [facility] [phone] 02/01/2021, 12:43 PM

## 2021-02-01 NOTE — Evaluation (Signed)
Physical Therapy Evaluation & Discharge Patient Details Name: [name] MRN: [id number] DOB: [birth year] Today's Date: 02/01/2021   History of Present Illness  Pt is a 75 y.o. female admitted 01/31/21 as direct admission from [facility] after being found unresponsive; workup for demand ischemia in the setting of hypoglycemia, acute on chronic diastolic HF. PMH includes CAD s/p CABG, CKD 3, DM2, HTN, HF.    Clinical Impression  Patient evaluated by Physical Therapy with no further acute PT needs identified. PTA, pt mod indep with intermittent use of SPC, lives alone with supportive family nearby. Education re: current condition, O2 needs, activity recommendations, and importance of mobility. Today, pt mod indep ambulating around room and performing ADL tasks; limited by anxiety regarding current situation (RN aware) and DOE. SpO2 down to 84% on RA with activity, quick to return to 90% with seated rest on RA; up to 97% on  2L O2, which pt uses PRN at home. All education has been completed and the patient has no further questions. Acute PT is signing off. Thank you for this referral.    Follow Up Recommendations No PT follow up    Equipment Recommendations  None recommended by PT    Recommendations for Other Services       Precautions / Restrictions Precautions Precautions: Other (comment) Precaution Comments: Watch SpO2 Restrictions Weight Bearing Restrictions: No      Mobility  Bed Mobility               General bed mobility comments: Received sitting EOB    Transfers Overall transfer level: Independent Equipment used: None                Ambulation/Gait Ambulation/Gait assistance: Modified independent (Device/Increase time) Gait Distance (Feet): 80 Feet Assistive device: None;IV Pole Gait Pattern/deviations: Step-through pattern;Decreased stride length Gait velocity: Decreased Gait velocity interpretation: 1.31 - 2.62 ft/sec, indicative of limited  community ambulator General Gait Details: Pt with fatigue, anxiety and SOB having just returned to sitting EOB after using bathroom with NT. Pt able to perform additional ambulation in room, mod indep pushing IV pole; DOE up to 3-4/4 requiring seated rest; SpO2 briefly down to 84% on RA, quick to return to 90% with seated rest; pt opting to replace 2L O2 [place] for recovery  Stairs            Wheelchair Mobility    Modified Rankin (Stroke Patients Only)       Balance Overall balance assessment: No apparent balance deficits (not formally assessed)                             High Level Balance Comments: Navigating obstacles in room well while pushing IV pole, as well as turns, stops, head turns, backwards/sideways walking without overt instability or LOB             Pertinent Vitals/Pain Pain Assessment: No/denies pain    Home Living Family/patient expects to be discharged to:: Private residence Living Arrangements: Alone Available Help at Discharge: Family;Available PRN/intermittently Type of Home: House Home Access: Stairs to enter;Ramped entrance Entrance Stairs-Rails: Right Entrance Stairs-Number of Steps: 3 Home Layout: One level Home Equipment: Grab bars - tub/shower;Cane - single point Additional Comments: Wears 2L O2 PRN at home; has pulse ox to monitor sats    Prior Function Level of Independence: Independent         Comments: Independent without DME (lately using SPC when LEs swollen),  drives; enjoys time with family and grandkids     Hand Dominance        Extremity/Trunk Assessment   Upper Extremity Assessment Upper Extremity Assessment: Overall WFL for tasks assessed    Lower Extremity Assessment Lower Extremity Assessment: Overall WFL for tasks assessed       Communication   Communication: No difficulties  Cognition Arousal/Alertness: Awake/alert Behavior During Therapy: Anxious Overall Cognitive Status: Within Functional  Limits for tasks assessed                                 General Comments: Pt reports anxious regarding current situation, "I just want to run." Reports this does not typically happen at home, nor does she take medicine for it      General Comments General comments (skin integrity, edema, etc.): SpO2 90% seated on RA after trip to bathroom, up to 97% seated on 2L    Exercises     Assessment/Plan    PT Assessment Patent does not need any further PT services  PT Problem List         PT Treatment Interventions      PT Goals (Current goals can be found in the Care Plan section)  Acute Rehab PT Goals PT Goal Formulation: All assessment and education complete, DC therapy    Frequency     Barriers to discharge        Co-evaluation               AM-PAC PT "6 Clicks" Mobility  Outcome Measure Help needed turning from your back to your side while in a flat bed without using bedrails?: None Help needed moving from lying on your back to sitting on the side of a flat bed without using bedrails?: None Help needed moving to and from a bed to a chair (including a wheelchair)?: None Help needed standing up from a chair using your arms (e.g., wheelchair or bedside chair)?: None Help needed to walk in hospital room?: None Help needed climbing 3-5 steps with a railing? : A Little 6 Click Score: 23    End of Session Equipment Utilized During Treatment: Oxygen Activity Tolerance: Patient tolerated treatment well Patient left: in chair;with call bell/phone within reach Nurse Communication: Mobility status (does not need chair alarm, can walk self to bathroom) PT Visit Diagnosis: Other abnormalities of gait and mobility (R26.89)    Time: 4854-6270 PT Time Calculation (min) (ACUTE ONLY): 20 min   Charges:   PT Evaluation $PT Eval Moderate Complexity: 1 Mod     Ina Homes, PT, DPT Acute Rehabilitation Services  Pager [phone] Office  [phone]  Malachy Chamber 02/01/2021, 9:13 AM

## 2021-02-01 NOTE — Progress Notes (Signed)
Heart Failure Stewardship Pharmacist Progress Note   PCP: Toma Deiters, MD PCP-Cardiologist: Dina Rich, MD    HPI:  75 yo F with PMH of CAD s/p CABG in 2008, CKD III, T2DM, HTN, HLD, and CHF. She was transferred to [facility] on 01/31/21 from [facility] for workup of NSTEMI, AMS, and severe hypoglycemia. An ECHO was done on 02/01/21 and LVEF is 60-65% with grade II diastolic dysfunction. She was also found to be volume overloaded and is being treated for acute on chronic diastolic heart failure.   Current HF Medications: Furosemide 40 mg IV BID Metoprolol tartrate 25 mg BID Hydralazine 100 mg TID Isordil 10 mg TID  Prior to admission HF Medications: Torsemide 20 mg BID Metoprolol tartrate 12.5 BID Lisinopril 20 mg daily Spironolactone 12.5 mg daily Hydralazine 25 mg TID Isordil 10 mg TID  Pertinent Lab Values: . Serum creatinine 1.32, BUN 22, Potassium 3.9, Sodium 140, BNP 752.4   Vital Signs: . Weight: 206 lbs (admission weight: 207 lbs) . Blood pressure: 150/60s . Heart rate: 60-70s   Medication Assistance / Insurance Benefits Check: Does the patient have prescription insurance?  Yes Type of insurance plan: [employer] Medicare  Does the patient qualify for medication assistance through manufacturers or grants?   Pending . Eligible grants and/or patient assistance programs: pending . Medication assistance applications in progress: none  . Medication assistance applications approved: none Approved medication assistance renewals will be completed by: pending  Outpatient Pharmacy:  Prior to admission outpatient pharmacy: [facility] Is the patient willing to use [facility] TOC pharmacy at discharge? Yes Is the patient willing to transition their outpatient pharmacy to utilize a [facility] outpatient pharmacy?   Pending    Assessment: 1. Acute on chronic diastolic CHF (EF 86-76% with grade 2 diastolic dysfunction). NYHA class II/III symptoms. - Continue furosemide 40 mg IV  BID - Continue metoprolol tartrate 25 mg BID - PTA lisinopril and spironolactone on hold - consider restarting pending SCr and BP trends - Continue hydralazine 100 mg TID - Continue isordil 10 mg TID - Consider starting SGLT2i pending glucose trends. Would not want to further aggravate hypoglycemic events. Once insulin regimen is optimized, can consider adding this for HFpEF and T2DM   Plan: 1) Medication changes recommended at this time: - Continue current regimen  2) Patient assistance application(s): - None pending  3)  Education  - To be completed prior to discharge  Sharen Hones, PharmD, BCPS Heart Failure Stewardship Pharmacist Phone [phone]

## 2021-02-01 NOTE — Progress Notes (Signed)
ANTICOAGULATION CONSULT NOTE - Initial Consult  Pharmacy Consult for Heparin Indication: chest pain/ACS/NSTEMI  No Known Allergies  Patient Measurements: Height: 5' (152.4 cm) Weight: 93.5 kg (206 lb 2.1 oz) IBW/kg (Calculated) : 45.5 Heparin Dosing Weight: 69.3 kg  Vital Signs: Temp: 98.5 F (36.9 C) (03/08 0757) Temp Source: Oral (03/08 0757) BP: 151/44 (03/08 0757) Pulse Rate: 66 (03/08 0757)   Medical History: Past Medical History:  Diagnosis Date  . (HFpEF) heart failure with preserved ejection fraction (HCC)    a. 08/2020 Echo ([facility]): EF 65-70%, gr2 DD, mod dil LA, mild to mod TR. Mild PAH - . Mildly dil RA.  Marland Kitchen Asthma    Childhood  . CAD (coronary artery disease)    a. 05/2007 s/p CABG x 4 (LIMA->LAD, VG->RI->OM, VG->RPDA; b. 09/2020 MV: small, mild apical to basal inflat reversible defect. Intermediate risk. EF 62%.  . CKD (chronic kidney disease), stage III (HCC)   . Depression   . Essential hypertension   . GERD (gastroesophageal reflux disease)   . Hyperlipidemia LDL goal <70   . Postinflammatory pulmonary fibrosis (HCC)   . Pure hypercholesterolemia   . 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 Type II or unspecified type diabetes mellitus without mention of complication, not stated as uncontrolled    Assessment:  75 yr old woman with NSTEMI in the setting of hypoglycemia.  Pharmacy asked to dose IV heparin.   HL 0.35 at low end of therapeutic. H/H, plt stable.  Goal of Therapy:  Heparin level 0.3-0.7 units/ml Monitor platelets by anticoagulation protocol: Yes   Plan:  Increase heparin drip to 1250 units/hr Daily heparin level and CBC while on heparin Monitor for signs/symptoms of bleeding Follow up echo, likely medical management  Alphia Moh, PharmD, BCPS, BCCP Clinical Pharmacist  Please check AMION for all [facility] phone numbers After 10:00 PM, call Main Pharmacy [phone]

## 2021-02-01 NOTE — Progress Notes (Signed)
  Echocardiogram 2D Echocardiogram has been performed.  Delcie Roch 02/01/2021, 12:09 PM

## 2021-02-02 ENCOUNTER — Encounter (HOSPITAL_COMMUNITY): Payer: Self-pay | Admitting: Cardiovascular Disease

## 2021-02-02 ENCOUNTER — Other Ambulatory Visit: Payer: Self-pay | Admitting: Student

## 2021-02-02 DIAGNOSIS — Z794 Long term (current) use of insulin: Secondary | ICD-10-CM

## 2021-02-02 DIAGNOSIS — I219 Acute myocardial infarction, unspecified: Secondary | ICD-10-CM

## 2021-02-02 DIAGNOSIS — E162 Hypoglycemia, unspecified: Secondary | ICD-10-CM

## 2021-02-02 DIAGNOSIS — E118 Type 2 diabetes mellitus with unspecified complications: Secondary | ICD-10-CM

## 2021-02-02 DIAGNOSIS — I10 Essential (primary) hypertension: Secondary | ICD-10-CM

## 2021-02-02 DIAGNOSIS — N1832 Chronic kidney disease, stage 3b: Secondary | ICD-10-CM

## 2021-02-02 DIAGNOSIS — I248 Other forms of acute ischemic heart disease: Secondary | ICD-10-CM

## 2021-02-02 DIAGNOSIS — N183 Chronic kidney disease, stage 3 unspecified: Secondary | ICD-10-CM

## 2021-02-02 DIAGNOSIS — I2510 Atherosclerotic heart disease of native coronary artery without angina pectoris: Secondary | ICD-10-CM

## 2021-02-02 LAB — BASIC METABOLIC PANEL
Anion gap: 11 (ref 5–15)
BUN: 20 mg/dL (ref 8–23)
CO2: 28 mmol/L (ref 22–32)
Calcium: 9 mg/dL (ref 8.9–10.3)
Chloride: 98 mmol/L (ref 98–111)
Creatinine, Ser: 1.37 mg/dL — ABNORMAL HIGH (ref 0.44–1.00)
GFR, Estimated: 41 mL/min — ABNORMAL LOW (ref >60)
Glucose, Bld: 133 mg/dL — ABNORMAL HIGH (ref 70–99)
Potassium: 3.8 mmol/L (ref 3.5–5.1)
Sodium: 137 mmol/L (ref 135–145)

## 2021-02-02 LAB — CBC
HCT: 30.2 % — ABNORMAL LOW (ref 36.0–46.0)
Hemoglobin: 9.5 g/dL — ABNORMAL LOW (ref 12.0–15.0)
MCH: 28.2 pg (ref 26.0–34.0)
MCHC: 31.5 g/dL (ref 30.0–36.0)
MCV: 89.6 fL (ref 80.0–100.0)
Platelets: 186 10*3/uL (ref 150–400)
RBC: 3.37 MIL/uL — ABNORMAL LOW (ref 3.87–5.11)
RDW: 14.1 % (ref 11.5–15.5)
WBC: 11.9 10*3/uL — ABNORMAL HIGH (ref 4.0–10.5)
nRBC: 0 % (ref 0.0–0.2)

## 2021-02-02 LAB — GLUCOSE, CAPILLARY: Glucose-Capillary: 124 mg/dL — ABNORMAL HIGH (ref 70–99)

## 2021-02-02 LAB — HEPARIN LEVEL (UNFRACTIONATED): Heparin Unfractionated: <0.1 IU/mL — ABNORMAL LOW (ref 0.30–0.70)

## 2021-02-02 MED ORDER — COMFORT TOUCH INSULIN PEN NEED 31G X 5 MM MISC: 1.0000 | Freq: Four times a day (QID) | 0 refills | Status: DC

## 2021-02-02 MED ORDER — INSULIN GLARGINE 100 UNIT/ML SOLOSTAR PEN: 5.0000 [IU] | PEN_INJECTOR | Freq: Every day | SUBCUTANEOUS | 0 refills | Status: DC

## 2021-02-02 MED ORDER — TORSEMIDE 20 MG PO TABS: ORAL_TABLET | ORAL | 2 refills | Status: DC

## 2021-02-02 MED FILL — LANTUS SOLOSTAR 100 UNITS/M: 100 | 30 days supply | Qty: 3 | Fill #0

## 2021-02-02 MED FILL — TORSEMIDE 20 MG TABLET: 20 | 30 days supply | Qty: 90 | Fill #0

## 2021-02-02 MED FILL — PENTIPS 32G X 4 MM MISC: 32G X 4 MM | 25 days supply | Qty: 100 | Fill #0

## 2021-02-02 NOTE — Progress Notes (Signed)
Heart Failure Stewardship Pharmacist Progress Note   PCP: Toma Deiters, MD PCP-Cardiologist: Dina Rich, MD    HPI:  75 yo F with PMH of CAD s/p CABG in 2008, CKD III, T2DM, HTN, HLD, and CHF. She was transferred to [facility] on 01/31/21 from [facility] for workup of NSTEMI, AMS, and severe hypoglycemia. An ECHO was done on 02/01/21 and LVEF is 60-65% with grade II diastolic dysfunction. She was also found to be volume overloaded and is being treated for acute on chronic diastolic heart failure.   Discharge HF Medications: Torsemdie 40 mg qAM, 20 mg qPM Metoprolol tartrate 12.5 mg BID Lisinopril 20 mg daily Spironolactone 12.5 mg daily Hydralazine 25 mg TID Isordil 10 mg TID  Prior to admission HF Medications: Torsemide 20 mg BID Metoprolol tartrate 12.5 BID Lisinopril 20 mg daily Spironolactone 12.5 mg daily Hydralazine 25 mg TID Isordil 10 mg TID  Pertinent Lab Values: . Serum creatinine 1.37, BUN 20, Potassium 3.8, Sodium 137, BNP 752.4   Vital Signs: . Weight: 202 lbs (admission weight: 207 lbs) . Blood pressure: 170/70s . Heart rate: 60s   Medication Assistance / Insurance Benefits Check: Does the patient have prescription insurance?  Yes Type of insurance plan: [employer] Medicare  Outpatient Pharmacy:  Prior to admission outpatient pharmacy: [facility] Is the patient willing to use [facility] TOC pharmacy at discharge? Yes Is the patient willing to transition their outpatient pharmacy to utilize a [facility] outpatient pharmacy?   Pending    Assessment: 1. Acute on chronic diastolic CHF (EF 30-16% with grade 2 diastolic dysfunction). NYHA class II/III symptoms. - Agree with increasing torsemide dose at discharge - Continue metoprolol tartrate 12.5 mg BID at discharge - Continue lisinopril 20 mg daily at discharge - Continue spironolactone 12.5 mg daily at discharge - Continue hydralazine 25 mg TID at discharge - Continue isordil 10 mg TID - Consider starting  SGLT2i pending glucose trends. Would not want to further aggravate hypoglycemic events. Glucose has seemed to have stabilized. Once insulin regimen is optimized, can consider adding this for HFpEF and T2DM - may need to be done during outpatient TOC visit   Plan: 1) Medication changes recommended at this time: - Continue current regimen; discharge today  2) Patient assistance application(s): - None pending   Sharen Hones, PharmD, BCPS Heart Failure Stewardship Pharmacist Phone [phone]

## 2021-02-02 NOTE — Discharge Summary (Addendum)
Discharge Summary    Patient ID: [name] MRN: [id number]; DOB: 30-Jan-1946  Admit date: 01/31/2021 Discharge date: 02/02/2021  PCP:  Toma Deiters, MD   [facility]  Cardiologist:  Dina Rich, MD  Advanced Practice Provider:  No care team member to display Electrophysiologist:  None   Discharge Diagnoses    Principal Problem:   Demand ischemia [facility]) Active Problems:   CAD s/p CABG   Acute on chronic diastolic heart failure (HCC)   Hypoglycemia   Hyperlipidemia   Essential hypertension   Type 2 diabetes mellitus with complication, with long-term current use of insulin (HCC)   Obesity, Class III, BMI 40-49.9 (morbid obesity) (HCC)   CKD (chronic kidney disease), stage III [facility])    Diagnostic Studies/Procedures    Echocardiogram 02/01/2021: Impressions:  1. Left ventricular ejection fraction, by estimation, is 60 to 65%. The  left ventricle has normal function. The left ventricle has no regional  wall motion abnormalities. Left ventricular diastolic parameters are  consistent with Grade II diastolic  dysfunction (pseudonormalization).   2. Right ventricular systolic function is normal. The right ventricular  size is normal. There is normal pulmonary artery systolic pressure.   3. The mitral valve is normal in structure. No evidence of mitral valve  regurgitation. No evidence of mitral stenosis.   4. The aortic valve is normal in structure. Aortic valve regurgitation is  not visualized. No aortic stenosis is present.  _____________   History of Present Illness     [name] is a 75 y.o. female with a history of CAD s/p CABG x4 in 05/2007, HFpEF, hypertension, hyperlipidemia, type 2 diabetes mellitus on insulin pump, CKD stage III, and morbid obesity who was transferred from [facility] on 01/31/2021 for further evaluation of elevated troponin in the setting of altered mental status and severe hypoglycemia with blood glucose  of 30.  She previously underwent diagnostic catheterization in June 2008 revealing severe multivessel coronary artery disease.  She was referred to CT surgery and subsequently underwent CABG x4 in July 2008.  She has done well over the years from a cardiac standpoint. She does have chronic dyspnea on exertion in the setting of morbid obesity, deconditioning, as well as HFpEF.  In October 2021, she was admitted to [facility] in the setting of hypertensive urgency and HFpEF. Echo during admission showed an EF of 65 to 70% with grade 2 diastolic dysfunction.  Estimated PASP was 44 mmHg.  No significant valvular abnormalities were noted.  She did have mild troponin elevation. Following diuresis, she was discharged home and followed up in our office in [place].  In the setting of mild troponin elevation/demand ischemia, she underwent stress testing in November 2021, which did show a small, mild intensity, apical to basal inferolateral reversible defect consistent with ischemia.  EF was 62%.  Study was felt to be of intermediate risk.  After review with the patient, she was not having any symptoms of chest pain or change in baseline level of dyspnea exertion; therefore, conservative medical therapy was recommended.  Patient was last seen in Cardiology clinic in December 2021, at which time she was doing well.   She reported she was in her usual state of health recently without any change in symptoms.  She continues to have chronic dyspnea on exertion which is stable.  Due to a lack of heat in her home, she had to stay with her sister-in-law last week but returned to her own home  on Friday. She did not think she had any dietary indiscretions while at her sister-in-law's and overall felt well. She reported Saturday 01/29/2021 was a normal day for her but had no recollection of what happened after going to bed on Saturday evening. Her family became concerned when they could not get in touch with her and finally came to  her house around 7pm on Sunday 01/30/2021 where she was found to be relatively unresponsive and still in her bed. Patient thinks she was asleep for the entire day. EMS was called and she was found to be hypoglycemic with a glucose of 30.  She was taken to the [facility] emergency department where she was treated for hypoglycemia and her insulin pump was discontinued. Lab work was notable for a rise in her creatinine to 2.09, which was above her baseline of 1.38 in November 2021.  High-sensitivity troponin was elevated at 632 >> 1074 >> 1777.  Patient reported that all of her home medications have been held during her hospital stay at [facility]. She was transferred to [facility] due to concern related to non-STEMI. Upon arrival to [facility], patient reiterated that she has not had any chest pain or change in dyspnea recently. She did feel that she might be volume overloaded and has a bandlike tightness around her abdomen abdomen, which is typically where she holds her fluid.  Hospital Course     Consultants: None  Demand Ischemia History of CAD s/p CABG in 2008 High-sensitivity troponin peaked at 1,777 at [facility]. Patient denied any chest pain. Echo showed LVEF of 60-65% with normal wall motion and grade 2 diastolic dysfunction. Troponin elevation felt to be consistent with demand myocardial injury in setting of severe and prolonged hypoglycemia rather than true ACS event. No ischemic evaluation felt to be necessary. Continue aspirin, beta-blocker, and statin.  Acute on Chronic Diastolic CHF Chronic Dyspnea on Exertion Patient noted to be mildly volume overloaded on admission. BNP elevated at 752. Patient was diuresed with IV Lasix with good response. Net negative 1.1 L throughout admission. Discharge weight 202 lbs (down 5lbs from admission). Will increase home Torsemide from 20mg  twice daily to 40mg  in the morning and 20mg  in the afternoon/evening. Continue K-Dur 20 mEq daily. Patient has  close follow-up in the [facility] ([facility]) arranged.  Of note, patient has chronic dyspnea on exertion and is on PRN O2 at home.  Profound Hypoglycemia  History of Type 2 Diabetes Mellitus Patient was found unresponsive at her home in setting of severe hypoglycemia with glucose of 30. Home insulin pump was stopped. [facility] and Diabetes Coordinator were consulted for assistance. Patient reported having hypoglycemic episodes several times a week and often waking up with glucose of 40's to 50's. Patient reported having to eat throughout the day in order to keep her glucose up. Hemoglobin A1 5.5 this admission. Diabetes Coordinator advised stopping insulin pump at discharge but SQ insulin recommended. Will discharge on Lantus 5 units at bedtime. Insulin pens prescribed. Patient states she already has glucometer and testing strips at home. Will refer patient to Endocrinology (Dr. Fransico Him in [place]). Advised patient to continue to monitor blood glucose closely at home and notify PCP if she continues to have hypoglycemic episodes before she can be seen by Endocrinology.  Hypertension BP has been elevated here during admission but she has not been on home Lisinopril or Spironolactone due to renal function. Renal function now back to baseline so will restart patient on all  home medications on discharge: Lisinopril 20mg  daily, Spironolactone 12.5mg  daily, Lopressor 12.5mg  twice daily, Isosorbide dinitrate 10mg  three times daily, and Hydralazine 25mg  three times daily. Advised patient to monitor BP at home and call our office if consistently above 130/80.  Hyperlipidemia Lipid panel this admission: Total Cholesterol 101, Triglycerides 61, HDL 43, LDL 46. LDL goal <70 given CAD. Continue home Lipitor 40mg  daily.  Acute on CKD Stage III Creatinine 2.0 at [facility]. Baseline around 1.3 in November 2021. Home Lisinopril and Spironolactone were held on admission and patient was diuresed.  Creatinine back to baseline at discharge. Therefore, will restart Lisinopril and Spironolactone. Can repeat BMET at follow-up visit.  Patient was seen and examined by Dr. Royann Shivers today and determined to be stable for discharge. Outpatient follow-up has been arranged. Medications as below.  Did the patient have an acute coronary syndrome (MI, NSTEMI, STEMI, etc) this admission?:  No.   The elevated Troponin was due to the acute medical illness (demand ischemia).      _____________  Discharge Vitals Blood pressure (!) 175/66, pulse 60, temperature 98.1 F (36.7 C), temperature source Oral, resp. rate 18, height 5' (1.524 m), weight 92 kg, SpO2 99 %.  Filed Weights   02/01/21 0000 02/01/21 0256 02/02/21 0259  Weight: 93.5 kg 93.5 kg 92 kg   General: 75 y.o. female resting comfortably in no acute distress. HEENT: Normocephalic and atraumatic. Sclera clear. Neck: Supple. No JVD. Heart: RRR.Distinct S1 and S2. No murmurs, gallops, or rubs.  Lungs: No increased work of breathing. Clear to ausculation bilaterally. No wheezes, rhonchi, or rales.  Abdomen: Soft, non-distended, and non-tender to palpation. MSK: Normal strength and tone for age. Extremities: Mild left lower extremity edema which is chronic.    Skin: Warm and dry. Neuro: Alert and oriented x3. No focal deficits. Psych: Normal affect. Responds appropriately.  Labs & Radiologic Studies    CBC Recent Labs    01/31/21 2005 02/01/21 0327 02/02/21 0305  WBC 9.7 11.3* 11.9*  NEUTROABS 7.3  --   --   HGB 10.0* 9.7* 9.5*  HCT 31.5* 29.9* 30.2*  MCV 88.7 88.5 89.6  PLT 183 185 186   Basic Metabolic Panel Recent Labs    29/56/21 0327 02/02/21 0305  NA 140 137  K 3.9 3.8  CL 102 98  CO2 30 28  GLUCOSE 162* 133*  BUN 22 20  CREATININE 1.32* 1.37*  CALCIUM 9.2 9.0   Liver Function Tests Recent Labs    01/31/21 2005  AST 14*  ALT 13  ALKPHOS 71  BILITOT 0.8  PROT 6.0*  ALBUMIN 3.3*   No results for input(s):  LIPASE, AMYLASE in the last 72 hours. High Sensitivity Troponin:   Recent Labs  Lab 01/31/21 2005 01/31/21 2121  TROPONINIHS 469* 530*    BNP Invalid input(s): POCBNP D-Dimer No results for input(s): DDIMER in the last 72 hours. Hemoglobin A1C Recent Labs    01/31/21 2005  HGBA1C 5.5   Fasting Lipid Panel Recent Labs    02/01/21 0327  CHOL 101  HDL 43  LDLCALC 46  TRIG 61  CHOLHDL 2.3   Thyroid Function Tests Recent Labs    01/31/21 2005  TSH 0.486   _____________  DG Chest Port 1 View  Result Date: 02/01/2021 CLINICAL DATA:  Shortness of breath. EXAM: PORTABLE CHEST 1 VIEW COMPARISON:  06/21/2007. FINDINGS: Prior CABG. Heart size normal. Low lung volumes. No focal infiltrate. No pleural effusion or pneumothorax. No acute bony abnormality. IMPRESSION: Prior  CABG. Low lung volumes. No acute cardiopulmonary disease. Exam stable from prior exam. Electronically Signed   By: Maisie Fushomas  Register   On: 02/01/2021 05:25   ECHOCARDIOGRAM COMPLETE  Result Date: 02/01/2021    ECHOCARDIOGRAM REPORT   Patient Name:   Valetta MoleCYNTHIA W Archer Date of Exam: 02/01/2021 Medical Rec #:  [id number]           Height:       60.0 in Accession #:    [id number]          Weight:       206.1 lb Date of Birth:  July 02, 1946           BSA:          1.891 m Patient Age:    74 years            BP:           155/101 mmHg Patient Gender: F                   HR:           66 bpm. Exam Location:  Inpatient Procedure: 2D Echo Indications:    NSTEMI  History:        Patient has prior history of Echocardiogram examinations, most                 recent 07/10/2019. Prior CABG, chronic kidney disease; Risk                 Factors:Diabetes and Hypertension.  Sonographer:    Delcie RochLauren Pennington Referring Phys: [id number] [place] THOMAS O'NEAL IMPRESSIONS  1. Left ventricular ejection fraction, by estimation, is 60 to 65%. The left ventricle has normal function. The left ventricle has no regional wall motion abnormalities. Left ventricular  diastolic parameters are consistent with Grade II diastolic dysfunction (pseudonormalization).  2. Right ventricular systolic function is normal. The right ventricular size is normal. There is normal pulmonary artery systolic pressure.  3. The mitral valve is normal in structure. No evidence of mitral valve regurgitation. No evidence of mitral stenosis.  4. The aortic valve is normal in structure. Aortic valve regurgitation is not visualized. No aortic stenosis is present. FINDINGS  Left Ventricle: Left ventricular ejection fraction, by estimation, is 60 to 65%. The left ventricle has normal function. The left ventricle has no regional wall motion abnormalities. The left ventricular internal cavity size was normal in size. There is  no left ventricular hypertrophy. Left ventricular diastolic parameters are consistent with Grade II diastolic dysfunction (pseudonormalization). Right Ventricle: The right ventricular size is normal. No increase in right ventricular wall thickness. Right ventricular systolic function is normal. There is normal pulmonary artery systolic pressure. The tricuspid regurgitant velocity is 2.86 m/s, and  with an assumed right atrial pressure of 3 mmHg, the estimated right ventricular systolic pressure is 35.7 mmHg. Left Atrium: Left atrial size was normal in size. Right Atrium: Right atrial size was normal in size. Pericardium: There is no evidence of pericardial effusion. Mitral Valve: The mitral valve is normal in structure. Mild mitral annular calcification. No evidence of mitral valve regurgitation. No evidence of mitral valve stenosis. Tricuspid Valve: The tricuspid valve is normal in structure. Tricuspid valve regurgitation is mild. Aortic Valve: The aortic valve is normal in structure. Aortic valve regurgitation is not visualized. No aortic stenosis is present. Pulmonic Valve: The pulmonic valve was normal in structure. Pulmonic valve regurgitation is not visualized. Aorta: The aortic  root and ascending aorta  are structurally normal, with no evidence of dilitation. IAS/Shunts: The atrial septum is grossly normal.  LEFT VENTRICLE PLAX 2D LVIDd:         4.40 cm  Diastology LVIDs:         2.90 cm  LV e' medial:    6.31 cm/s LV PW:         1.00 cm  LV E/e' medial:  18.2 LV IVS:        0.90 cm  LV e' lateral:   9.90 cm/s LVOT diam:     1.80 cm  LV E/e' lateral: 11.6 LV SV:         82 LV SV Index:   43 LVOT Area:     2.54 cm  RIGHT VENTRICLE             IVC RV S prime:     10.20 cm/s  IVC diam: 2.10 cm TAPSE (M-mode): 1.8 cm LEFT ATRIUM             Index       RIGHT ATRIUM           Index LA diam:        4.80 cm 2.54 cm/m  RA Area:     12.40 cm LA Vol (A2C):   59.6 ml 31.52 ml/m RA Volume:   28.00 ml  14.81 ml/m LA Vol (A4C):   53.1 ml 28.08 ml/m LA Biplane Vol: 56.8 ml 30.04 ml/m  AORTIC VALVE LVOT Vmax:   126.00 cm/s LVOT Vmean:  86.900 cm/s LVOT VTI:    0.323 m  AORTA Ao Asc diam: 2.90 cm MITRAL VALVE                TRICUSPID VALVE MV Area (PHT): 3.53 cm     TR Peak grad:   32.7 mmHg MV Decel Time: 215 msec     TR Vmax:        286.00 cm/s MV E velocity: 115.00 cm/s MV A velocity: 67.40 cm/s   SHUNTS MV E/A ratio:  1.71         Systemic VTI:  0.32 m                             Systemic Diam: 1.80 cm Kristeen Miss MD Electronically signed by Kristeen Miss MD Signature Date/Time: 02/01/2021/1:46:43 PM    Final    Disposition   Patient is being discharged home today in good condition.  Follow-up Plans & Appointments     Follow-up Information     Netta Neat., NP Follow up.   Specialty: Cardiology Why: Hospital follow-up scheduled for 02/22/2021 at 9:30am with Rennis Harding, one of Dr. Verna Czech NPs in our [facility]. Please arrive 15 minutes early for check-in. If this date/time does not work for you, please call our office to reschedule. Contact information: [address] [place] [address] [phone]         Roma Kayser, MD Follow up.    Specialty: Endocrinology Why: We have placed a referral to Endocrinology. Their office should contact you to help schedule an appointment. Contact information: [address] [place] [address] [phone]         [facility]. Go on 02/07/2021.   Specialty: Cardiology Why: AT 2pm. [facility] TOC appt.  FREE valet parking at [employer] [address] off [address] all your medications  and pill box with you.  Contact information: [address] [id number] mc [place] [address] [phone]               Discharge Instructions     Ambulatory referral to Endocrinology   Complete by: As directed    Type 2 diabetes mellitus with hypoglycemia.   Diet - low sodium heart healthy   Complete by: As directed    Increase activity slowly   Complete by: As directed        Discharge Medications   Allergies as of 02/02/2021   No Known Allergies      Medication List     STOP taking these medications    OmniPod 10 Pack Misc       TAKE these medications    aspirin 81 MG tablet Take 81 mg by mouth daily.   atorvastatin 40 MG tablet Commonly known as: LIPITOR Take 1 tablet by mouth daily.   brimonidine 0.2 % ophthalmic solution Commonly known as: ALPHAGAN Place 1 drop into the left eye 4 (four) times daily.   Comfort Touch Insulin Pen Need 31G X 5 MM Misc Generic drug: Insulin Pen Needle 1 each by Does not apply route in the morning, at noon, in the evening, and at bedtime.   gabapentin 300 MG capsule Commonly known as: NEURONTIN Take 300 mg by mouth every 3 (three) days.   hydrALAZINE 25 MG tablet Commonly known as: APRESOLINE Take 25 mg by mouth 3 (three) times daily.   insulin glargine 100 UNIT/ML Solostar Pen Commonly known as: LANTUS Inject 5 Units into the skin at bedtime.   isosorbide dinitrate 10 MG tablet Commonly known as: ISORDIL Take 10 mg by mouth 3 (three) times  daily.   lisinopril 40 MG tablet Commonly known as: ZESTRIL TAKE 1/2 TABLET DAILY   metoprolol tartrate 25 MG tablet Commonly known as: LOPRESSOR Take 0.5 tablets (12.5 mg total) by mouth 2 (two) times daily.   nitroGLYCERIN 0.4 MG SL tablet Commonly known as: NITROSTAT DISSOLVE ONE TABLET UNDER THE TONGUE EVERY 5 MINUTES AS NEEDED FOR CHEST PAIN.  DO NOT EXCEED A TOTAL OF 3 DOSES IN 15 MINUTES   ofloxacin 0.3 % ophthalmic solution Commonly known as: OCUFLOX Place 1 drop into the right eye 2 (two) times daily.   OMEGA-3 FISH OIL PO Take 1 tablet by mouth daily.   omeprazole 20 MG capsule Commonly known as: PRILOSEC Take 20 mg by mouth daily.   OXYGEN Inhale into the lungs. As needed   potassium chloride SA 20 MEQ tablet Commonly known as: KLOR-CON Take 20 mEq by mouth daily.   promethazine 25 MG tablet Commonly known as: PHENERGAN Take 25 mg by mouth as needed.   spironolactone 25 MG tablet Commonly known as: ALDACTONE Take 0.5 tablets by mouth daily.   torsemide 20 MG tablet Commonly known as: DEMADEX Take 2 tablets (40mg ) in the morning and 1 tablet (20mg ) in the afternoon/evening. What changed:   how much to take  how to take this  when to take this  additional instructions   traMADol 50 MG tablet Commonly known as: ULTRAM Take 1 tablet by mouth as needed.   Vitamin D 125 MCG (5000 UT) Caps Take by mouth daily.               Durable Medical Equipment  (From admission, onward)           Start     Ordered   02/02/21 [phone]  For home use only DME Other see comment  Once       Comments: OCD eval please  Question:  Length of Need  Answer:  Lifetime   02/02/21 0957               Outstanding Labs/Studies   Repeat BMET at follow-up visit.  Duration of Discharge Encounter   Greater than 30 minutes including physician time.  Signed, Corrin Parker, PA-C 02/02/2021, 10:22 AM  I have seen and examined the patient along with  Corrin Parker, PA-C.  I have reviewed the chart, notes and new data.  I agree with PA/NP's note.  Key new complaints: breathing improved after extra diuretics Key examination changes: no JVD/rales/edema Key new findings / data: stable renal parameters and K.  PLAN: Stop insulin pump due to excessively tight glycemic control and refer to Endocrinology. Lantus 5 u daily til then. Increase daily dose of loop diuretic. Discussed sodium restriction in detail. Reviewed role of weight monitoring and signs/symptoms of HF exacerbation. F/U arranged in the [place] office.  Thurmon Fair, MD, [facility] [phone] 02/02/2021, 10:27 AM

## 2021-02-02 NOTE — Progress Notes (Addendum)
Heart Failure Nurse Navigator Progress Note  PCP: Toma Deiters, MD PCP-Cardiologist: Wyline Mood, MD Admission Diagnosis: demand ischemia Admitted from: home alone  Presentation:   [name] presented as transfer from [facility] with elevated troponins, SOB, BG 30, AMS. Pt has been on 40mg  IV lasix BID since admission. On continuous oxygen at 2LPM, pt uses PRN at home. Asked for portable oxygen concentrator uses [employer]- passed request to , [facility]. Pt stated her furnace went out last week, but is fixed now. Hot water heater is down currently waiting on a part, states she plans to shower at her SIL home.  ECHO/ LVEF: 60-65%, G2DD.   Clinical Course:  Past Medical History:  Diagnosis Date  . (HFpEF) heart failure with preserved ejection fraction (HCC)    a. 08/2020 Echo ([facility]): EF 65-70%, gr2 DD, mod dil LA, mild to mod TR. Mild PAH - 09/2020. Mildly dil RA.  Asthma    Childhood  . CAD (coronary artery disease)    a. 05/2007 s/p CABG x 4 (LIMA->LAD, VG->RI->OM, VG->RPDA; b. 09/2020 MV: small, mild apical to basal inflat reversible defect. Intermediate risk. EF 62%.  . CKD (chronic kidney disease), stage III (HCC)   . Depression   . Essential hypertension   . GERD (gastroesophageal reflux disease)   . Hyperlipidemia LDL goal <70   . Postinflammatory pulmonary fibrosis (HCC)   . 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 Type II or unspecified type diabetes mellitus without mention of complication, not stated as uncontrolled     Social History   Socioeconomic History  . Marital status: Divorced    Spouse name: Not on file  . Number of children: Not on file  . Years of education: Not on file  . Highest education level: Not on file  Occupational History  . Occupation: RETIRED  Tobacco Use  . Smoking status: Former Smoker    Packs/day: 1.00    Years: 34.00    Pack years: 34.00    Types: Cigarettes    Quit date: 11/27/2001    Years  since quitting: 19.1  . Smokeless tobacco: Never Used  Vaping Use  . Vaping Use: Never used  Substance and Sexual Activity  . Alcohol use: No    Alcohol/week: 0.0 standard drinks  . Drug use: No  . Sexual activity: Not on file  Other Topics Concern  . Not on file  Social History Narrative   Lives in eating by herself.  Does not routinely exercise.   Social Determinants of Health   Financial Resource Strain: Low Risk   . Difficulty of Paying Living Expenses: Not very hard  Food Insecurity: No Food Insecurity  . Worried About 01/25/2002 in the Last Year: Never true  . Ran Out of Food in the Last Year: Never true  Transportation Needs: No Transportation Needs  . Lack of Transportation (Medical): No  . Lack of Transportation (Non-Medical): No  Physical Activity: Not on file  Stress: Not on file  Social Connections: Not on file   High Risk Criteria for Readmission and/or Poor Patient Outcomes:  Heart failure hospital admissions (last 6 months): 1  No Show rate: 6%  Difficult social situation: no  Demonstrates medication adherence: yes  Primary Language: English  Literacy level: able to read/write and comprehend.  Education Assessment and Provision:  Detailed education and instructions provided on heart failure disease management including the following:  Signs and symptoms of Heart Failure  When to call the physician Importance of daily weights Low sodium diet Fluid restriction Medication management Anticipated future follow-up appointments  Patient education given on each of the above topics.  Patient acknowledges understanding via teach back method and acceptance of all instructions.  Education Materials:  "Living Better With Heart Failure" Booklet, HF zone tool, & Daily Weight Tracker Tool.  Patient has scale at home: yes Patient has pill box at home: yes   Barriers of Care:   -none  Considerations/Referrals:   Referral made to Heart Failure  Pharmacist Stewardship: yes, to see at [facility] visit Referral made to Heart & Vascular TOC clinic: yes, Monday March 14 @ 2pm  Items for Follow-up on DC/TOC: -medication optimization -medication cost at day 31.   Ozella Rocks, RN, BSN Heart Failure Nurse Navigator [phone]

## 2021-02-02 NOTE — Discharge Instructions (Signed)
Medication Changes: - INCREASE Torsemide to 40mg  in the morning and 20mg  in the afternoon/evening. - STOP Insulin pump and START Lantus 5 units at night. Please continue to monitor your blood glucose very closely at home and reach out to PCP if you continue to have hypoglycemic episodes before you can be seen by Endocrinology. - Continue all other medications as listed elsewhere on discharge paperwork. _______________  Dr. (Endocrinologist in [facility]) [address] [place], [place], [place] Phone: [phone]  We have placed a referral to Dr. [id number] (Endocrionology) and his office should contact you to help arrange a visit. Recommend asking your PCP about prescribing a continuous glucose monitoring sensor (such as FreeStyle Libre2 or Dexcom). _______________  Heart Failure Education: 1. Weigh yourself EVERY morning after you go to the bathroom but before you eat or drink anything. Write this number down in a weight log/diary. If you gain 3 pounds overnight or 5 pounds in a week, call the office. 2. Take your medicines as prescribed. If you have concerns about your medications, please call [phone] before you stop taking them.  3. Eat low salt foods--Limit salt (sodium) to 2000 mg per day. This will help prevent your body from holding onto fluid. Read food labels as many processed foods have a lot of sodium, especially canned goods and prepackaged meats. If you would like some assistance choosing low sodium foods, we would be happy to set you up with a nutritionist. 4. Limit all fluids for the day to less than 2 liters (64 ounces). Fluid includes all drinks, coffee, juice, ice chips, soup, jello, and all other liquids. 5. Stay as active as you can everyday. Staying active will give you more energy and make your muscles stronger. Start with 5 minutes at a time and work your way up to 30 minutes a day. Break up your activities--do some in the morning and some in the afternoon. Start with 3 days  per week and work your way up to 5 days as you can.  If you have chest pain, feel short of breath, dizzy, or lightheaded, STOP. If you don't feel better after a short rest, call 911. If you do feel better, call the office to let Fransico Him know you have symptoms with exercise.

## 2021-02-02 NOTE — Progress Notes (Signed)
D/C instructions given and reviewed. Tele and IV removed, tolerate well. Awaiting TOC meds.

## 2021-02-02 NOTE — TOC Transition Note (Signed)
Transition of Care [facility]) - CM/SW Discharge Note   Patient Details  Name: [name] MRN: [id number] Date of Birth: 1946/01/27  Transition of Care [facility] CM/SW Contact:  Leone Haven, RN Phone Number: 02/02/2021, 10:00 AM   Clinical Narrative:    Patient is for dc today, she would like to be evaluated for the oxygen conserving device with [employer], the order was put in  For adap, she already has home oxygen with them.    Final next level of care: Home/Self Care Barriers to Discharge: No Barriers Identified   Patient Goals and CMS Choice Patient states their goals for this hospitalization and ongoing recovery are:: go home   Choice offered to / list presented to : NA  Discharge Placement                       Discharge Plan and Services                  DME Agency: NA       HH Arranged: NA          Social Determinants of Health (SDOH) Interventions     Readmission Risk Interventions No flowsheet data found.

## 2021-02-02 NOTE — Plan of Care (Signed)
°  Problem: Education: °Goal: Ability to demonstrate management of disease process will improve °Outcome: Adequate for Discharge °Goal: Ability to verbalize understanding of medication therapies will improve °Outcome: Adequate for Discharge °Goal: Individualized Educational Video(s) °Outcome: Adequate for Discharge °  °

## 2021-02-02 NOTE — Consult Note (Signed)
   [facility] Inpatient Consult   02/02/2021  LIANY MUMPOWER 01-31-1946 [id number]  [facility] [[facility]]  Accountable Care Organization [ACO] Patient: [employer]  Patient screened for HF follow up needs.  Spoke with the patient via hospital phone, HIPAA verified,  regarding the benefits of [facility] Care Management services.  Explained that [facility] Care Management is a covered benefit of insurance. Explained that [facility] Care Management does not interfere with or replace any services arranged by the inpatient Transition of Care [TOC] team.  Patient denies any issues for transition and states, "I really feel I will be alright."  She endorses follow up with her primary care provider [PCP]. PCP: Toma Deiters, MD, [facility] is listed to provide the [facility] follow up. Encouraged patient to contact PCP if she changes her mind about care management. Patient verbalized understanding.  Patient declined services with [facility] Care Management at this time.  For questions,  Charlesetta Shanks, RN BSN CCM [facility]  [phone] business mobile phone Toll free office [phone]  Fax number: [phone] [email or web address]

## 2021-02-03 DIAGNOSIS — Z0000 Encounter for general adult medical examination without abnormal findings: Secondary | ICD-10-CM | POA: Diagnosis not present

## 2021-02-03 DIAGNOSIS — E7849 Other hyperlipidemia: Secondary | ICD-10-CM | POA: Diagnosis not present

## 2021-02-03 DIAGNOSIS — E162 Hypoglycemia, unspecified: Secondary | ICD-10-CM | POA: Diagnosis not present

## 2021-02-03 DIAGNOSIS — I5031 Acute diastolic (congestive) heart failure: Secondary | ICD-10-CM | POA: Diagnosis not present

## 2021-02-03 DIAGNOSIS — N182 Chronic kidney disease, stage 2 (mild): Secondary | ICD-10-CM | POA: Diagnosis not present

## 2021-02-03 DIAGNOSIS — I10 Essential (primary) hypertension: Secondary | ICD-10-CM | POA: Diagnosis not present

## 2021-02-05 DIAGNOSIS — M5431 Sciatica, right side: Secondary | ICD-10-CM | POA: Diagnosis not present

## 2021-02-07 ENCOUNTER — Encounter (HOSPITAL_COMMUNITY): Payer: Medicare Other

## 2021-02-07 ENCOUNTER — Telehealth (HOSPITAL_COMMUNITY): Payer: Self-pay

## 2021-02-07 NOTE — Telephone Encounter (Signed)
Called to confirm Heart & Vascular Transitions of Care appointment at [facility]. Pt stated she is not going to make it due to having a "sciatica attack" and trying to schedule an appt with her PCP, plans to call back to reschedule.  Gave encouragement and emotional support.   Canceled HV TOC appt.  Ozella Rocks, RN, BSN Heart Failure Nurse Navigator [phone]

## 2021-02-09 ENCOUNTER — Ambulatory Visit: Payer: Medicare Other | Admitting: Nutrition

## 2021-02-17 DIAGNOSIS — E119 Type 2 diabetes mellitus without complications: Secondary | ICD-10-CM | POA: Diagnosis not present

## 2021-02-17 DIAGNOSIS — Z794 Long term (current) use of insulin: Secondary | ICD-10-CM | POA: Diagnosis not present

## 2021-02-18 ENCOUNTER — Encounter: Payer: Self-pay | Admitting: Family Medicine

## 2021-02-18 DIAGNOSIS — N182 Chronic kidney disease, stage 2 (mild): Secondary | ICD-10-CM | POA: Diagnosis not present

## 2021-02-21 NOTE — Progress Notes (Addendum)
Cardiology Office Note  Date: 02/22/2021   ID: [name], DOB 29-Jan-1946, MRN [id number]  PCP:  Toma DeitersHasanaj, Xaje A, MD  Cardiologist:  Dina RichBranch, Jonathan, MD Electrophysiologist:  None   Chief Complaint Hospital follow-up  History of Present Illness: [name] is a 75 y.o. female with a history of chronic diastolic heart failure, HTN, DOE, CAD (LIMA-LAD, SVG-ramus, SVG-OM, SVG-PDA).  Previous encounter with Dr. Wyline MoodBranch 07/15/2020 for chronic diastolic heart failure / dyspnea on exertion.  At previous visit she had reported shortness of breath and DOE x 2 months.  It mainly occurred with activity.  No chest pain associated.  Had some lower extremity edema which had worsened.  She was wearing compression socks.  No orthopnea.  Previous echo August 2020 EF greater than 65% with grade 2 DD.  Nuclear stress test 08/17/2019 no clear ischemia.  At a prior visit torsemide was increased to 60 mg daily.  Norvasc was discontinued due to leg swelling.  Lisinopril increased to 40 mg daily.  Shortness of breath had improved.  She was compliant with her statin medications and labs were followed by PCP.  Recent hospitalization on 01/31/2021 with diagnoses of demand ischemia, CAD status post CABG, acute on chronic diastolic heart failure, hyperglycemia, hyperlipidemia, hypertension, DM type II, obesity, CKD. [facility]21 for further evaluation of elevated troponin in setting of altered mental status and severe hypoglycemia with blood glucose of 30.  She was treated at [facility] department for hyperglycemia and was treated.  Lab work was notable for increase in creatinine to 2.9.  Troponins were [phone].    [facility] due to concern related to non-STEMI.  [facility] she reiterated she had not had any chest pain or change in dyspnea recently.  She did feel that she might be volume overloaded and described  a bandlike tightness around her abdomen.  She was noted to be mildly volume overloaded on admission.  BNP was elevated at 752.  She was diuresed with IV Lasix with good response.  She was net -1.1 L throughout admission.  Discharge weight was 202.  Home torsemide was increased from 20 mg daily to 40 mg a.m. and 20 mg in the afternoon.  Continuing KCl 20 mEq daily.   She was to have close follow-up with heart failure clinic which was arranged per discharge note.  Blood pressure was elevated in the hospital but had not been on home lisinopril or spironolactone due to renal function.  Lisinopril 20 mg, spironolactone 12.5 mg, Lopressor 12.5 mg p.o. twice daily, Isorbid dinitrate 10 mg 3 times daily and hydralazine 25 mg 3 times daily were restarted.  She was advised to monitor blood pressures at home.  She was to continue to new Lipitor 40 mg at home.  LDL was 46.  She is here today for follow-up she states she feels fairly well.  She does state she is having to use her oxygen more at home and she is having some more edema as well as some weight gain.  Weight today on our scales is 209.  Her weight at hospital discharge was 202.  She has been taking her increased doses of torsemide as directed.  Blood pressure today on arrival was 140/58.  She states her blood pressure systolic has been running around this number recently.  Previous measurements on 10/08/2020 and 11/05/2020 demonstrated blood pressures of 138/60 and 120/48 respectively.  Past Medical History:  Diagnosis Date  . (HFpEF) heart failure with preserved ejection fraction (HCC)    a. 08/2020 Echo ([facility]): EF 65-70%, gr2 DD, mod dil LA, mild to mod TR. Mild PAH - . Mildly dil RA.  Marland Kitchen Asthma    Childhood  . CAD (coronary artery disease)    a. 05/2007 s/p CABG x 4 (LIMA->LAD, VG->RI->OM, VG->RPDA; b. 09/2020 MV: small, mild apical to basal inflat reversible defect. Intermediate risk. EF 62%.  . CKD (chronic kidney disease), stage III (HCC)    . Depression   . Essential hypertension   . GERD (gastroesophageal reflux disease)   . Hyperlipidemia LDL goal <70   . Postinflammatory pulmonary fibrosis (HCC)   . 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 Type II or unspecified type diabetes mellitus without mention of complication, not stated as uncontrolled     Past Surgical History:  Procedure Laterality Date  . CORONARY ARTERY BYPASS GRAFT    05/29/2007     Current Outpatient Medications  Medication Sig Dispense Refill  . aspirin 81 MG tablet Take 81 mg by mouth daily.      Marland Kitchen atorvastatin (LIPITOR) 40 MG tablet Take 1 tablet by mouth daily.  0  . brimonidine (ALPHAGAN) 0.2 % ophthalmic solution Place 1 drop into the left eye 4 (four) times daily.    . Cholecalciferol (VITAMIN D) 125 MCG (5000 UT) CAPS Take by mouth daily.     Marland Kitchen gabapentin (NEURONTIN) 300 MG capsule Take 300 mg by mouth every 3 (three) days.    . hydrALAZINE (APRESOLINE) 25 MG tablet Take 25 mg by mouth 3 (three) times daily.    . insulin glargine (LANTUS) 100 UNIT/ML Solostar Pen Inject 5 Units into the skin at bedtime. 15 mL 0  . Insulin Pen Needle (COMFORT TOUCH INSULIN PEN NEED) 31G X 5 MM MISC 1 each by Does not apply route in the morning, at noon, in the evening, and at bedtime. 100 each 0  . isosorbide dinitrate (ISORDIL) 10 MG tablet Take 10 mg by mouth 3 (three) times daily.    Marland Kitchen lisinopril (ZESTRIL) 40 MG tablet TAKE 1/2 TABLET DAILY 45 tablet 1  . metoprolol tartrate (LOPRESSOR) 25 MG tablet Take 0.5 tablets (12.5 mg total) by mouth 2 (two) times daily. 90 tablet 1  . nitroGLYCERIN (NITROSTAT) 0.4 MG SL tablet DISSOLVE ONE TABLET UNDER THE TONGUE EVERY 5 MINUTES AS NEEDED FOR CHEST PAIN.  DO NOT EXCEED A TOTAL OF 3 DOSES IN 15 MINUTES 25 tablet 3  . ofloxacin (OCUFLOX) 0.3 % ophthalmic solution Place 1 drop into the right eye 2 (two) times daily.    . Omega-3 Fatty Acids (OMEGA-3 FISH OIL PO) Take 1 tablet by mouth daily.     Marland Kitchen omeprazole (PRILOSEC) 20 MG capsule Take 20 mg by mouth daily.    . OXYGEN Inhale into the lungs. As needed    . potassium chloride SA (KLOR-CON) 20 MEQ tablet Take 20 mEq by mouth daily.    . promethazine (PHENERGAN) 25 MG tablet Take 25 mg by mouth as needed.    Marland Kitchen spironolactone (ALDACTONE) 25 MG tablet Take 0.5 tablets by mouth daily.    Marland Kitchen torsemide (DEMADEX) 20 MG tablet Take 2 tablets (40mg ) in the morning and 1 tablet (20mg ) in the afternoon/evening. 90 tablet 2  . traMADol (ULTRAM) 50 MG tablet Take 1 tablet by mouth as needed.      No current facility-administered medications for  this visit.   Allergies:  Patient has no known allergies.   Social History: The patient  reports that she quit smoking about 19 years ago. Her smoking use included cigarettes. She has a 34.00 pack-year smoking history. She has never used smokeless tobacco. She reports that she does not drink alcohol and does not use drugs.   Family History: The patient's family history includes Cancer in an other family member; Coronary artery disease in an other family member.   ROS:  Please see the history of present illness. Otherwise, complete review of systems is positive for none.  All other systems are reviewed and negative.   Physical Exam: VS:  Ht 5' (1.524 m)   Wt 209 lb 3.2 oz (94.9 kg)   BMI 40.86 kg/m , BMI Body mass index is 40.86 kg/m.  Wt Readings from Last 3 Encounters:  02/22/21 209 lb 3.2 oz (94.9 kg)  02/02/21 202 lb 13.2 oz (92 kg)  11/05/20 206 lb (93.4 kg)    General: Patient appears comfortable at rest. Neck: Supple, no elevated JVP or carotid bruits, no thyromegaly. Lungs: Clear to auscultation, nonlabored breathing at rest. Cardiac: Regular rate and rhythm, no S3 or significant systolic murmur, no pericardial rub. Extremities: Mild pitting edema, distal pulses 2+. Skin: Warm and dry. Musculoskeletal: No kyphosis. Neuropsychiatric: Alert and oriented x3, affect grossly  appropriate.  ECG:  EKG January 31, 2021 normal sinus rhythm rate of 78, nonspecific ST and T wave abnormality  Recent Labwork: 01/31/2021: ALT 13; AST 14; B Natriuretic Peptide 752.4; TSH 0.486 02/02/2021: BUN 20; Creatinine, Ser 1.37; Hemoglobin 9.5; Platelets 186; Potassium 3.8; Sodium 137     Component Value Date/Time   CHOL 101 02/01/2021 0327   TRIG 61 02/01/2021 0327   HDL 43 02/01/2021 0327   CHOLHDL 2.3 02/01/2021 0327   VLDL 12 02/01/2021 0327   LDLCALC 46 02/01/2021 0327    Other Studies Reviewed Today:  Echocardiogram 02/01/2021 1. Left ventricular ejection fraction, by estimation, is 60 to 65%. The left ventricle has normal function. The left ventricle has no regional wall motion abnormalities. Left ventricular diastolic parameters are consistent with Grade II diastolic dysfunction (pseudonormalization). 2. Right ventricular systolic function is normal. The right ventricular size is normal. There is normal pulmonary artery systolic pressure. 3. The mitral valve is normal in structure. No evidence of mitral valve regurgitation. No evidence of mitral stenosis. 4. The aortic valve is normal in structure. Aortic valve regurgitation is not visualized. No aortic stenosis is present.   NST 10/15/2020 Narrative & Impression   No diagnostic ST segment changes to indicate ischemia.  Small, mild intensity, apical to basal inferolateral defect that is reversible and consistent with ischemia. There is also mild RV radiotracer uptake noted which could be indicative of underlying pulmonary disease or increased left ventricular end-diastolic pressure. TID ratio is normal at 1.16.  This is a low to intermediate risk study.  Nuclear stress EF: 62%.     Echocardiogram during hospital stay at [facility] on 09/25/2020 Summary 1. The left ventricle is normal in size with normal wall thickness. 2. The left ventricular systolic function is normal, LVEF is visually estimated at  65-70%. 3. There is grade II diastolic dysfunction (elevated filling pressure). 4. The left atrium is moderately dilated in size. 5. There is mild to moderate tricuspid regurgitation. 6. There is mild pulmonary hypertension, estimated pulmonary artery systolic pressure is 44 mmHg. 7. The right atrium is mildly dilated in size.  Cath 04/2007 FINDINGS: Aortic pressure 115/52 with a mean of 76. Left ventricular  pressure 122/10.  CORONARY ANGIOGRAPHY: Left main is angiographically normal in its  proximal and mid portion. It tapers distally and there is a 50-60%  stenosis in the distal left main.  The LAD is a large-caliber vessel in its proximal portion. The LAD only  extends down to the mid anterior wall. There is a brick. There is a  diagonal branch in the territory of a second diagonal that is the  dominant vessel. There are two septal perforators. The ostium of the  LAD has an 80% stenosis involving the true ostium. It appears very  focal and is difficult to lay out and to lay out by angiography, but  there is clearly a high-grade stenosis there.  The ramus intermedius has 40% stenosis in the mid portion. It is a  medium caliber vessel. The left circumflex has no significant  angiographic disease. It provides a second OM branch site that supplies  a second and third OM branch. The AV groove circumflex is small.  The right coronary artery is diffusely diseased. Proximal portion of  the vessel has luminal irregularities mid portion has a 40 at the site  30-40% stenosis. The vessel branches into a PDA and also provides two  PL branches. The PDA has a 50% stenosis in its mid portion. The first  PL branch also has a 50% stenosis.  Left ventricular function assessed by 30-degree RAO left  ventriculography is hyperdynamic. There are no regional wall motion  abnormalities. The LVEF is 75%.  ASSESSMENT:  1. Moderate distal left main stenosis.  2. Severe  ostial left anterior descending stenosis.  3. Nonobstructive right coronary artery stenosis.  4. Normal to hyperdynamic left ventricular function.  [name] has disease involving the distal left main and ostial left  anterior descending. I think she requires surgical consultation for  coronary bypass. If she has any resting symptoms, we will restart her  heparin.  09/2016 echo Study Conclusions  - Left ventricle: The cavity size was normal. Wall thickness was normal. Systolic function was vigorous. The estimated ejection fraction was in the range of 75% to 80%. Wall motion was normal; there were no regional wall motion abnormalities. The study is not technically sufficient to allow evaluation of LV diastolic function. - Mitral valve: There was trivial regurgitation. - Right atrium: Central venous pressure (est): 3 mm Hg. - Tricuspid valve: There was trivial regurgitation. - Pulmonary arteries: PA peak pressure: 18 mm Hg (S). - Pericardium, extracardiac: There was no pericardial effusion.  Impressions:  - Normal LV wall thickness with LVEF 75-80%. Indeterminate diastolic function. Normal left atrial chamber size with trivial mitral regurgitation. Trivial tricuspid regurgitation with normal estimated PASP.   09/2016 MPI  The study is normal.  This is a low risk study.  The left ventricular ejection fraction is hyperdynamic (>65%).  There was no ST segment deviation noted during stress.  Normal resting and stress perfusion. No ischemia or infarction EF 84%   06/2019 echo 1. The left ventricle has hyperdynamic systolic function, with an ejection fraction of >65%. The cavity size was normal. Moderate basal septal hypertrophy. Otherwise mild concentric LVH of remaining myocardium. Left ventricular diastolic Doppler  parameters are consistent with pseudonormalization. Elevated left ventricular end-diastolic pressure. 2. The mitral valve is  grossly normal. 3. The tricuspid valve is grossly normal. 4. The aortic valve was not well visualized. 5. The aorta is normal unless otherwise noted.  07/2019 nuclear stress  There was no ST segment deviation noted during stress.  Probable normal perfusion and mild soft tissue attenuation (diaphragm) No ischemia or scar.  Nuclear stress EF: 68%.  This is a low risk study.  Assessment and Plan:   1. Chronic diastolic heart failure (HCC) Recent hospitalization for elevated troponin status post altered mental status secondary to decreased blood sugar.  She was mildly fluid overloaded and received IV diuretics and was net -1.1 L at discharge recently.  Her torsemide was increased to 40 mg a.m. and 20 mg in the afternoon.  Her weight on arrival today is 209.  Her discharge weight was 202 from recent hospitalization.  Discharge note states she will be following with heart failure clinic but I see no referrals in epic to reflect that.  I will make the referral.  I advised her she could take an extra dose of torsemide 10 mg as needed for increased weight of 3 pounds in 24 hours or 5 pounds in 1 week until she is seen in heart failure clinic.  I advised her to restrict sodium intake.  Restrict fluid intake to 60 ounces daily, in addition to daily weights.  Recent lab work dated 02/18/2021 from her PCP office is hard to read but it appears her creatinine is 1.61 and GFR is 34.  This is decreased from last lab work in epic on 02/02/2021 when creatinine was 1.37 and GFR was 41.   Continue spironolactone 12.5 mg daily.  Continue potassium supplementation.  Continue lisinopril 20 mg daily.  Continue hydralazine 25 mg p.o. 3 times daily.  2. Essential hypertension Blood pressure today is 140/58.  Continue hydralazine 25 mg p.o. 3 times daily.  Continue metoprolol to 12.5 mg p.o. twice daily.  Continue twice 40 mg a.m. and 20 mg in the afternoon.  3. Mixed hyperlipidemia Continue atorvastatin 40 mg p.o.  daily.  Please obtain recent lab work from PCP drawn on 10/06/2020.  4.  Dyspnea on exertion with elevated troponins during recent hospital visit. Previous history of CABG times 02/2007.  Patient states she has been having to wear her O2 more recently since hospital discharge.  Patient denies any active anginal symptoms.  Continue nitroglycerin sublingual as needed as well as isosorbide dinitrate 10 mg twice daily.  Medication Adjustments/Labs and Tests Ordered: Current medicines are reviewed at length with the patient today.  Concerns regarding medicines are outlined above.   Disposition: Follow-up with Dr. Wyline Mood at previously scheduled appointment in June  Signed, Tanya Keach Quinn, NP 02/22/2021 9:41 AM    [facility] at [facility] [address] [place], [place], [place] [address] Phone: [phone]; Fax: [phone]

## 2021-02-22 ENCOUNTER — Ambulatory Visit: Payer: Medicare Other | Admitting: Family Medicine

## 2021-02-22 ENCOUNTER — Encounter: Payer: Self-pay | Admitting: Family Medicine

## 2021-02-22 VITALS — BP 140/58 | HR 59 | Ht 60.0 in | Wt 209.2 lb

## 2021-02-22 DIAGNOSIS — I5032 Chronic diastolic (congestive) heart failure: Secondary | ICD-10-CM

## 2021-02-22 MED ORDER — TORSEMIDE 20 MG PO TABS: ORAL_TABLET | ORAL | 6 refills | Status: DC

## 2021-02-22 NOTE — Patient Instructions (Addendum)
Medication Instructions:   May take 1/2 tab extra of Torsemide for weight gain of 3 lbs in 24 hours or 5 lbs in one week.   Continue all other medications.    Labwork: none  Testing/Procedures: none  Follow-Up: 3 months   Any Other Special Instructions Will Be Listed Below (If Applicable). You have been referred to:  Heart failure clinic  If you need a refill on your cardiac medications before your next appointment, please call your pharmacy.

## 2021-02-23 ENCOUNTER — Other Ambulatory Visit: Payer: Self-pay | Admitting: Internal Medicine

## 2021-02-23 DIAGNOSIS — G8929 Other chronic pain: Secondary | ICD-10-CM

## 2021-02-25 ENCOUNTER — Other Ambulatory Visit: Payer: Medicare Other

## 2021-02-26 DIAGNOSIS — R778 Other specified abnormalities of plasma proteins: Secondary | ICD-10-CM | POA: Diagnosis not present

## 2021-02-26 DIAGNOSIS — R519 Headache, unspecified: Secondary | ICD-10-CM | POA: Diagnosis not present

## 2021-02-26 DIAGNOSIS — E119 Type 2 diabetes mellitus without complications: Secondary | ICD-10-CM | POA: Diagnosis not present

## 2021-02-26 DIAGNOSIS — I10 Essential (primary) hypertension: Secondary | ICD-10-CM | POA: Diagnosis not present

## 2021-02-28 ENCOUNTER — Other Ambulatory Visit: Payer: Self-pay

## 2021-02-28 ENCOUNTER — Ambulatory Visit
Admission: RE | Admit: 2021-02-28 | Discharge: 2021-02-28 | Disposition: A | Discharge status: Home / Self Care | Payer: Medicare Other | Source: Ambulatory Visit | Attending: Internal Medicine | Admitting: Internal Medicine

## 2021-02-28 DIAGNOSIS — M5126 Other intervertebral disc displacement, lumbar region: Secondary | ICD-10-CM | POA: Diagnosis not present

## 2021-02-28 DIAGNOSIS — G8929 Other chronic pain: Secondary | ICD-10-CM

## 2021-02-28 MED ORDER — METHYLPREDNISOLONE ACETATE 40 MG/ML INJ SUSP (RADIOLOG
120.0000 mg | Freq: Once | INTRAMUSCULAR | Status: AC
  Administered 2021-02-28: 120 mg via EPIDURAL
  Administered 2021-02-28: 80 mg via EPIDURAL

## 2021-02-28 MED ORDER — IOPAMIDOL (ISOVUE-M 200) INJECTION 41%
1.0000 mL | Freq: Once | INTRAMUSCULAR | Status: AC
  Administered 2021-02-28: 1 mL via EPIDURAL

## 2021-02-28 NOTE — Discharge Instructions (Signed)

## 2021-03-04 DIAGNOSIS — R519 Headache, unspecified: Secondary | ICD-10-CM | POA: Diagnosis not present

## 2021-03-04 DIAGNOSIS — I10 Essential (primary) hypertension: Secondary | ICD-10-CM | POA: Diagnosis not present

## 2021-03-04 DIAGNOSIS — R778 Other specified abnormalities of plasma proteins: Secondary | ICD-10-CM | POA: Diagnosis not present

## 2021-03-04 DIAGNOSIS — E119 Type 2 diabetes mellitus without complications: Secondary | ICD-10-CM | POA: Diagnosis not present

## 2021-03-10 ENCOUNTER — Telehealth (HOSPITAL_COMMUNITY): Payer: Self-pay | Admitting: Internal Medicine

## 2021-03-10 NOTE — Telephone Encounter (Signed)
Pt called to f/u w/referral, please advise 

## 2021-03-15 ENCOUNTER — Telehealth: Payer: Self-pay | Admitting: Cardiology

## 2021-03-15 NOTE — Telephone Encounter (Signed)
Patient called in regards to elevated BP. She is wanting to know if lisinopril can be increased to one pill vs 1/2 pill

## 2021-03-15 NOTE — Telephone Encounter (Signed)
BP for the last 2 weeks has been 170s-180s/50s-80s still c/o SOB and takes extra torsemide prn that helps this - currently taking lisinopril 20 mg daily and wanted to know if she should go back to 40 mg

## 2021-03-16 MED ORDER — HYDRALAZINE HCL 50 MG PO TABS: 50.0000 mg | ORAL_TABLET | Freq: Three times a day (TID) | ORAL | 1 refills | Status: DC

## 2021-03-16 NOTE — Telephone Encounter (Signed)
Patient informed and verbalized understanding of plan. 

## 2021-03-16 NOTE — Telephone Encounter (Signed)
Due to her renal function would not increase lisinopril, would increase hydralazine to 50mg  tid. Update on bp's early next week. Getting bp down may help with breathing  J Cheryln Balcom MD

## 2021-03-16 NOTE — Addendum Note (Signed)
Addended by: Eustace Moore on: 03/16/2021 04:56 PM   Modules accepted: Orders

## 2021-03-17 ENCOUNTER — Other Ambulatory Visit: Payer: Self-pay

## 2021-03-17 ENCOUNTER — Encounter: Payer: Self-pay | Admitting: Nutrition

## 2021-03-17 ENCOUNTER — Encounter: Payer: Medicare Other | Attending: Internal Medicine | Admitting: Nutrition

## 2021-03-17 VITALS — Ht 60.0 in | Wt 210.6 lb

## 2021-03-17 DIAGNOSIS — E118 Type 2 diabetes mellitus with unspecified complications: Secondary | ICD-10-CM | POA: Diagnosis not present

## 2021-03-17 DIAGNOSIS — I2510 Atherosclerotic heart disease of native coronary artery without angina pectoris: Secondary | ICD-10-CM | POA: Diagnosis not present

## 2021-03-17 DIAGNOSIS — Z794 Long term (current) use of insulin: Secondary | ICD-10-CM | POA: Insufficient documentation

## 2021-03-17 DIAGNOSIS — I10 Essential (primary) hypertension: Secondary | ICD-10-CM | POA: Diagnosis not present

## 2021-03-17 DIAGNOSIS — N183 Chronic kidney disease, stage 3 unspecified: Secondary | ICD-10-CM | POA: Diagnosis not present

## 2021-03-17 DIAGNOSIS — E782 Mixed hyperlipidemia: Secondary | ICD-10-CM | POA: Insufficient documentation

## 2021-03-17 NOTE — Progress Notes (Signed)
Medical Nutrition Therapy  Appointment Start time:  1300  Appointment End time:  1400  Primary concerns today: CHF, Dm Type 2, Obesity  Referral diagnosis: E11.8, E66.01 Preferred learning style: no preference indicated Learning readiness:  change in progress   NUTRITION ASSESSMENT   Anthropometrics  Wt Readings from Last 3 Encounters:  03/17/21 210 lb 9.6 oz (95.5 kg)  02/22/21 209 lb 3.2 oz (94.9 kg)  02/02/21 202 lb 13.2 oz (92 kg)   Ht Readings from Last 3 Encounters:  03/17/21 5' (1.524 m)  02/22/21 5' (1.524 m)  01/31/21 5' (1.524 m)   Body mass index is 41.13 kg/m. @BMIFA @ Facility age limit for growth percentiles is 20 years. Facility age limit for growth percentiles is 20 years.   Clinical Medical Hx:  CHF, HTH, DM and WT, Hyperlipidemia Medications: see chart : Omni pod pump and Dexcom Labs: A`C 5.9%. for the last year Notable Signs/Symptoms: Fluid  Lifestyle & Dietary Hx LIves by herself. Has Omnipod. Eats meals at home or eats out. Had meeting with a dietitian over 10 yrs ago. PCP Dr. She seems to be a little confused as to how much insulin she takes. Wears oxygen  Most of the time. Estimated daily fluid intake:  24 oz Supplements:  Sleep: Random; 6 hours Stress / self-care:  Current average weekly physical activity: ADL due to breathing issues.   24-Hr Dietary Recall First Meal: Oatmeal- regular brown sugar/maple  Snack:  Second Meal: Hot dog with bun, Lemonade crystal light Snack:  Candy bar if BS is dropping or cutie Third Meal: had a small bowl of oatmeal Snack:  Beverages: sf lemonade, fruit drink or coke  Estimated Energy Needs Calories: 1200 Carbohydrate: 135g Protein: 90g Fat: 33g   NUTRITION DIAGNOSIS  NB-1.1 Food and nutrition-related knowledge deficit As related to Diabetes Type 2.  As evidenced by A1C 5.5% with long acting insulin.   NUTRITION INTERVENTION  Nutrition education (E-1) on the following topics:   . Nutrition and Diabetes education provided on My Plate, CHO counting, meal planning, portion sizes, timing of meals, avoiding snacks between meals unless having a low blood sugar, target ranges for A1C and blood sugars, signs/symptoms and treatment of hyper/hypoglycemia, monitoring blood sugars, taking medications as prescribed, benefits of exercising 30 minutes per day and prevention of complications of DM. Olena Leatherwood Low Cholesterol Low Sodium Diet .   Handouts Provided Include   The Plate Method   Meal Plan Card  Diabetes Instructions  Low Cholesterol Diet  Learning Style & Readiness for Change Teaching method utilized: Visual & Auditory  Demonstrated degree of understanding via: Teach Back  Barriers to learning/adherence to lifestyle change: none  Goals Established by Pt . Follow My Plate . Eat 30 g CHO at each meals . Do not skip meals . Cut out snacks and grazing . Increase fresh fruits and low carb vegetables . Lose 2 lbs per month . Avoid fried and processed foods   MONITORING & EVALUATION Dietary intake, weekly physical activity, and weight in 1 month.  Next Steps  Patient is to work on better meal planning and healthier food choices.Marland Kitchen

## 2021-03-18 NOTE — Telephone Encounter (Signed)
Pt sch 5/24

## 2021-03-28 DIAGNOSIS — R519 Headache, unspecified: Secondary | ICD-10-CM | POA: Diagnosis not present

## 2021-03-28 DIAGNOSIS — R778 Other specified abnormalities of plasma proteins: Secondary | ICD-10-CM | POA: Diagnosis not present

## 2021-03-28 DIAGNOSIS — E119 Type 2 diabetes mellitus without complications: Secondary | ICD-10-CM | POA: Diagnosis not present

## 2021-03-28 DIAGNOSIS — I10 Essential (primary) hypertension: Secondary | ICD-10-CM | POA: Diagnosis not present

## 2021-03-30 ENCOUNTER — Encounter: Payer: Self-pay | Admitting: Nutrition

## 2021-03-30 NOTE — Patient Instructions (Signed)
Goals Established by Pt . Follow My Plate . Eat 30 g CHO at each meals . Do not skip meals . Cut out snacks and grazing . Increase fresh fruits and low carb vegetables . Lose 2 lbs per month . Avoid fried and processed foods

## 2021-04-01 ENCOUNTER — Other Ambulatory Visit (HOSPITAL_COMMUNITY): Payer: Self-pay

## 2021-04-03 DIAGNOSIS — E119 Type 2 diabetes mellitus without complications: Secondary | ICD-10-CM | POA: Diagnosis not present

## 2021-04-03 DIAGNOSIS — R778 Other specified abnormalities of plasma proteins: Secondary | ICD-10-CM | POA: Diagnosis not present

## 2021-04-03 DIAGNOSIS — I10 Essential (primary) hypertension: Secondary | ICD-10-CM | POA: Diagnosis not present

## 2021-04-03 DIAGNOSIS — R519 Headache, unspecified: Secondary | ICD-10-CM | POA: Diagnosis not present

## 2021-04-10 DIAGNOSIS — I2510 Atherosclerotic heart disease of native coronary artery without angina pectoris: Secondary | ICD-10-CM | POA: Diagnosis not present

## 2021-04-10 DIAGNOSIS — E119 Type 2 diabetes mellitus without complications: Secondary | ICD-10-CM | POA: Diagnosis not present

## 2021-04-10 DIAGNOSIS — I5189 Other ill-defined heart diseases: Secondary | ICD-10-CM | POA: Diagnosis not present

## 2021-04-10 DIAGNOSIS — Z794 Long term (current) use of insulin: Secondary | ICD-10-CM | POA: Diagnosis not present

## 2021-04-10 DIAGNOSIS — D649 Anemia, unspecified: Secondary | ICD-10-CM | POA: Diagnosis not present

## 2021-04-10 DIAGNOSIS — I10 Essential (primary) hypertension: Secondary | ICD-10-CM | POA: Diagnosis not present

## 2021-04-10 DIAGNOSIS — I5033 Acute on chronic diastolic (congestive) heart failure: Secondary | ICD-10-CM | POA: Diagnosis not present

## 2021-04-10 DIAGNOSIS — R6889 Other general symptoms and signs: Secondary | ICD-10-CM | POA: Diagnosis not present

## 2021-04-10 DIAGNOSIS — E7800 Pure hypercholesterolemia, unspecified: Secondary | ICD-10-CM | POA: Diagnosis not present

## 2021-04-10 DIAGNOSIS — Z9641 Presence of insulin pump (external) (internal): Secondary | ICD-10-CM | POA: Diagnosis not present

## 2021-04-10 DIAGNOSIS — Z20822 Contact with and (suspected) exposure to covid-19: Secondary | ICD-10-CM | POA: Diagnosis not present

## 2021-04-10 DIAGNOSIS — R778 Other specified abnormalities of plasma proteins: Secondary | ICD-10-CM | POA: Diagnosis not present

## 2021-04-10 DIAGNOSIS — R0602 Shortness of breath: Secondary | ICD-10-CM | POA: Diagnosis not present

## 2021-04-10 DIAGNOSIS — Z79899 Other long term (current) drug therapy: Secondary | ICD-10-CM | POA: Diagnosis not present

## 2021-04-10 DIAGNOSIS — J811 Chronic pulmonary edema: Secondary | ICD-10-CM | POA: Diagnosis not present

## 2021-04-10 DIAGNOSIS — J9611 Chronic respiratory failure with hypoxia: Secondary | ICD-10-CM | POA: Diagnosis not present

## 2021-04-10 DIAGNOSIS — Z9981 Dependence on supplemental oxygen: Secondary | ICD-10-CM | POA: Diagnosis not present

## 2021-04-10 DIAGNOSIS — J449 Chronic obstructive pulmonary disease, unspecified: Secondary | ICD-10-CM | POA: Diagnosis not present

## 2021-04-10 DIAGNOSIS — I509 Heart failure, unspecified: Secondary | ICD-10-CM | POA: Diagnosis not present

## 2021-04-10 DIAGNOSIS — R069 Unspecified abnormalities of breathing: Secondary | ICD-10-CM | POA: Diagnosis not present

## 2021-04-10 DIAGNOSIS — Z7982 Long term (current) use of aspirin: Secondary | ICD-10-CM | POA: Diagnosis not present

## 2021-04-10 DIAGNOSIS — I110 Hypertensive heart disease with heart failure: Secondary | ICD-10-CM | POA: Diagnosis not present

## 2021-04-10 DIAGNOSIS — R110 Nausea: Secondary | ICD-10-CM | POA: Diagnosis not present

## 2021-04-10 DIAGNOSIS — I517 Cardiomegaly: Secondary | ICD-10-CM | POA: Diagnosis not present

## 2021-04-10 DIAGNOSIS — Z743 Need for continuous supervision: Secondary | ICD-10-CM | POA: Diagnosis not present

## 2021-04-10 DIAGNOSIS — R790 Abnormal level of blood mineral: Secondary | ICD-10-CM | POA: Diagnosis not present

## 2021-04-10 DIAGNOSIS — Z87891 Personal history of nicotine dependence: Secondary | ICD-10-CM | POA: Diagnosis not present

## 2021-04-10 DIAGNOSIS — R82998 Other abnormal findings in urine: Secondary | ICD-10-CM | POA: Diagnosis not present

## 2021-04-10 DIAGNOSIS — E785 Hyperlipidemia, unspecified: Secondary | ICD-10-CM | POA: Diagnosis not present

## 2021-04-10 DIAGNOSIS — I071 Rheumatic tricuspid insufficiency: Secondary | ICD-10-CM | POA: Diagnosis not present

## 2021-04-13 DIAGNOSIS — I509 Heart failure, unspecified: Secondary | ICD-10-CM | POA: Diagnosis not present

## 2021-04-13 DIAGNOSIS — M6281 Muscle weakness (generalized): Secondary | ICD-10-CM | POA: Diagnosis not present

## 2021-04-14 ENCOUNTER — Other Ambulatory Visit: Payer: Self-pay | Admitting: Family Medicine

## 2021-04-15 DIAGNOSIS — I509 Heart failure, unspecified: Secondary | ICD-10-CM | POA: Diagnosis not present

## 2021-04-15 DIAGNOSIS — E119 Type 2 diabetes mellitus without complications: Secondary | ICD-10-CM | POA: Diagnosis not present

## 2021-04-15 DIAGNOSIS — Z794 Long term (current) use of insulin: Secondary | ICD-10-CM | POA: Diagnosis not present

## 2021-04-15 DIAGNOSIS — J449 Chronic obstructive pulmonary disease, unspecified: Secondary | ICD-10-CM | POA: Diagnosis not present

## 2021-04-15 DIAGNOSIS — Z951 Presence of aortocoronary bypass graft: Secondary | ICD-10-CM | POA: Diagnosis not present

## 2021-04-15 DIAGNOSIS — D649 Anemia, unspecified: Secondary | ICD-10-CM | POA: Diagnosis not present

## 2021-04-15 DIAGNOSIS — Z9981 Dependence on supplemental oxygen: Secondary | ICD-10-CM | POA: Diagnosis not present

## 2021-04-15 DIAGNOSIS — Z87891 Personal history of nicotine dependence: Secondary | ICD-10-CM | POA: Diagnosis not present

## 2021-04-15 DIAGNOSIS — Z7982 Long term (current) use of aspirin: Secondary | ICD-10-CM | POA: Diagnosis not present

## 2021-04-15 DIAGNOSIS — E7800 Pure hypercholesterolemia, unspecified: Secondary | ICD-10-CM | POA: Diagnosis not present

## 2021-04-15 DIAGNOSIS — G5602 Carpal tunnel syndrome, left upper limb: Secondary | ICD-10-CM | POA: Diagnosis not present

## 2021-04-15 DIAGNOSIS — I2510 Atherosclerotic heart disease of native coronary artery without angina pectoris: Secondary | ICD-10-CM | POA: Diagnosis not present

## 2021-04-15 DIAGNOSIS — I110 Hypertensive heart disease with heart failure: Secondary | ICD-10-CM | POA: Diagnosis not present

## 2021-04-19 ENCOUNTER — Encounter (HOSPITAL_COMMUNITY): Payer: Self-pay | Admitting: Cardiology

## 2021-04-19 ENCOUNTER — Other Ambulatory Visit: Payer: Self-pay

## 2021-04-19 ENCOUNTER — Ambulatory Visit (HOSPITAL_COMMUNITY)
Admission: RE | Admit: 2021-04-19 | Discharge: 2021-04-19 | Disposition: A | Discharge status: Home / Self Care | Payer: Medicare Other | Source: Ambulatory Visit | Attending: Cardiology | Admitting: Cardiology

## 2021-04-19 VITALS — BP 140/60 | HR 55 | Wt 199.6 lb

## 2021-04-19 DIAGNOSIS — I130 Hypertensive heart and chronic kidney disease with heart failure and stage 1 through stage 4 chronic kidney disease, or unspecified chronic kidney disease: Secondary | ICD-10-CM | POA: Diagnosis not present

## 2021-04-19 DIAGNOSIS — Z794 Long term (current) use of insulin: Secondary | ICD-10-CM | POA: Insufficient documentation

## 2021-04-19 DIAGNOSIS — I5032 Chronic diastolic (congestive) heart failure: Secondary | ICD-10-CM

## 2021-04-19 DIAGNOSIS — N179 Acute kidney failure, unspecified: Secondary | ICD-10-CM | POA: Insufficient documentation

## 2021-04-19 DIAGNOSIS — Z7982 Long term (current) use of aspirin: Secondary | ICD-10-CM | POA: Diagnosis not present

## 2021-04-19 DIAGNOSIS — R0609 Other forms of dyspnea: Secondary | ICD-10-CM | POA: Insufficient documentation

## 2021-04-19 DIAGNOSIS — Z7901 Long term (current) use of anticoagulants: Secondary | ICD-10-CM | POA: Diagnosis not present

## 2021-04-19 DIAGNOSIS — N183 Chronic kidney disease, stage 3 unspecified: Secondary | ICD-10-CM | POA: Diagnosis not present

## 2021-04-19 DIAGNOSIS — R7989 Other specified abnormal findings of blood chemistry: Secondary | ICD-10-CM

## 2021-04-19 DIAGNOSIS — Z79899 Other long term (current) drug therapy: Secondary | ICD-10-CM | POA: Diagnosis not present

## 2021-04-19 DIAGNOSIS — Z87891 Personal history of nicotine dependence: Secondary | ICD-10-CM | POA: Diagnosis not present

## 2021-04-19 DIAGNOSIS — R778 Other specified abnormalities of plasma proteins: Secondary | ICD-10-CM | POA: Diagnosis not present

## 2021-04-19 DIAGNOSIS — R0602 Shortness of breath: Secondary | ICD-10-CM | POA: Diagnosis not present

## 2021-04-19 DIAGNOSIS — I2510 Atherosclerotic heart disease of native coronary artery without angina pectoris: Secondary | ICD-10-CM | POA: Diagnosis not present

## 2021-04-19 HISTORY — DX: Heart failure, unspecified: I50.9

## 2021-04-19 LAB — BRAIN NATRIURETIC PEPTIDE: B Natriuretic Peptide: 318.6 pg/mL — ABNORMAL HIGH (ref 0.0–100.0)

## 2021-04-19 LAB — BASIC METABOLIC PANEL
Anion gap: 11 (ref 5–15)
BUN: 24 mg/dL — ABNORMAL HIGH (ref 8–23)
CO2: 29 mmol/L (ref 22–32)
Calcium: 9.5 mg/dL (ref 8.9–10.3)
Chloride: 98 mmol/L (ref 98–111)
Creatinine, Ser: 1.65 mg/dL — ABNORMAL HIGH (ref 0.44–1.00)
GFR, Estimated: 33 mL/min — ABNORMAL LOW (ref >60)
Glucose, Bld: 85 mg/dL (ref 70–99)
Potassium: 4.3 mmol/L (ref 3.5–5.1)
Sodium: 138 mmol/L (ref 135–145)

## 2021-04-19 MED ORDER — EMPAGLIFLOZIN 10 MG PO TABS: 10.0000 mg | ORAL_TABLET | Freq: Every day | ORAL | 11 refills | Status: DC

## 2021-04-19 MED ORDER — TORSEMIDE 20 MG PO TABS: 40.0000 mg | ORAL_TABLET | Freq: Two times a day (BID) | ORAL | 11 refills | Status: DC

## 2021-04-19 MED ORDER — HYDRALAZINE HCL 50 MG PO TABS: 75.0000 mg | ORAL_TABLET | Freq: Three times a day (TID) | ORAL | 3 refills | Status: DC

## 2021-04-19 NOTE — Patient Instructions (Addendum)
Labs done today. We will contact you only if your labs are abnormal.  START Jardiance 10mg  (1 tablet) by mouth daily.  INCREASE Hydralazine 75mg ( 1 & 1/2 tablets) by mouth 3 times daily.  INCREASE Torsemide to 40mg  (2 tablets) by mouth 2 times daily.  No other medication changes were made. Please continue all current medications as prescribed.  Your physician recommends that you schedule a follow-up appointment in: 2 weeks post cath  If you have any questions or concerns before your next appointment please send a message through [facility] or call our office at [phone].    TO LEAVE A MESSAGE FOR THE NURSE SELECT OPTION 2, PLEASE LEAVE A MESSAGE INCLUDING: . YOUR NAME . DATE OF BIRTH . CALL BACK NUMBER . REASON FOR CALL**this is important as we prioritize the call backs  YOU WILL RECEIVE A CALL BACK THE SAME DAY AS LONG AS YOU CALL BEFORE 4:00 PM   Do the following things EVERYDAY: 1) Weigh yourself in the morning before breakfast. Write it down and keep it in a log. 2) Take your medicines as prescribed 3) Eat low salt foods--Limit salt (sodium) to 2000 mg per day.  4) Stay as active as you can everyday 5) Limit all fluids for the day to less than 2 liters   At the [facility], you and your health needs are our priority. As part of our continuing mission to provide you with exceptional heart care, we have created designated Provider Care Teams. These Care Teams include your primary Cardiologist (physician) and Advanced Practice Providers (APPs- Physician Assistants and Nurse Practitioners) who all work together to provide you with the care you need, when you need it.   You may see any of the following providers on your designated Care Team at your next follow up: [address] [place] . Dr [phone] . Marland Kitchen, NP . Arvilla Meres, PA . Marca Ancona, PharmD   Please be sure to bring in all your medications bottles to every appointment.    You  are scheduled for a Cardiac Catheterization on Thursday, June 2 with Dr. Robbie Lis.  1. Please arrive at the [facility] (Main Entrance A) at [facility]: [address] [place], [facility] [address] at 10:30 AM (This time is two hours before your procedure to ensure your preparation). Free valet parking service is available.   Special note: Every effort is made to have your procedure done on time. Please understand that emergencies sometimes delay scheduled procedures.  2. Diet: Do not eat solid foods after midnight.  The patient may have clear liquids until 5am upon the day of the procedure.   4.Hold insulin pump as directed.   On the morning of your procedure, take your Aspirin and any morning medicines NOT listed above.  You may use sips of water.  5. Plan for one night stay--bring personal belongings. 6. Bring a current list of your medications and current insurance cards. 7. You MUST have a responsible person to drive you home. 8. Someone MUST be with you the first 24 hours after you arrive home or your discharge will be delayed. 9. Please wear clothes that are easy to get on and off and wear slip-on shoes.  Thank you for allowing [facility] to care for you!   -- [facility]

## 2021-04-20 DIAGNOSIS — I110 Hypertensive heart disease with heart failure: Secondary | ICD-10-CM | POA: Diagnosis not present

## 2021-04-20 DIAGNOSIS — J449 Chronic obstructive pulmonary disease, unspecified: Secondary | ICD-10-CM | POA: Diagnosis not present

## 2021-04-20 DIAGNOSIS — E119 Type 2 diabetes mellitus without complications: Secondary | ICD-10-CM | POA: Diagnosis not present

## 2021-04-20 DIAGNOSIS — I509 Heart failure, unspecified: Secondary | ICD-10-CM | POA: Diagnosis not present

## 2021-04-20 DIAGNOSIS — Z9981 Dependence on supplemental oxygen: Secondary | ICD-10-CM | POA: Diagnosis not present

## 2021-04-20 DIAGNOSIS — G5602 Carpal tunnel syndrome, left upper limb: Secondary | ICD-10-CM | POA: Diagnosis not present

## 2021-04-20 DIAGNOSIS — Z7982 Long term (current) use of aspirin: Secondary | ICD-10-CM | POA: Diagnosis not present

## 2021-04-20 DIAGNOSIS — I2510 Atherosclerotic heart disease of native coronary artery without angina pectoris: Secondary | ICD-10-CM | POA: Diagnosis not present

## 2021-04-20 DIAGNOSIS — Z951 Presence of aortocoronary bypass graft: Secondary | ICD-10-CM | POA: Diagnosis not present

## 2021-04-20 DIAGNOSIS — Z87891 Personal history of nicotine dependence: Secondary | ICD-10-CM | POA: Diagnosis not present

## 2021-04-20 DIAGNOSIS — Z794 Long term (current) use of insulin: Secondary | ICD-10-CM | POA: Diagnosis not present

## 2021-04-20 DIAGNOSIS — D649 Anemia, unspecified: Secondary | ICD-10-CM | POA: Diagnosis not present

## 2021-04-20 DIAGNOSIS — E7800 Pure hypercholesterolemia, unspecified: Secondary | ICD-10-CM | POA: Diagnosis not present

## 2021-04-20 NOTE — H&P (View-Only) (Signed)
PCP: Toma Deiters, MD Cardiology: Dr. Wyline Mood HF Cardiology: Dr. Shirlee Latch  75 y.o. with history of CAD, diastolic CHF, and poorly controlled HTN was referred by Dr. Wyline Mood for CHF evaluation. Patient had CABG in 2008.  She has poorly controlled HTN and CKD stage 3.  She was admitted in 10/21 with hypertensive urgency and CHF due to medication noncompliance.  She was admitted again in 3/22 with acute diastolic CHF, AKI to 2.9. HS-TnI was 1777 but cath was not done due to AKI.  Echo showed EF 60-65% with normal RV.  She was again admitted (this time to [facility]) in 5/22 with CHF.  No med changes were made.  Given multiple admission with CHF and difficulty managing volume, she was referred to CHF clinic.    BP is mildly elevated today, she has taken all her meds.  She is quite symptomatic, has been using oxygen since her 5/22 hospitalization.  No chest pain, but she gets very short of breath.  She tires easily doing chores around the house and is short of breath walking about 100 feet.  She has orthopnea.  She has significant lower extremity edema.  Dyspnea with most moderate activity.  She is trying to watch sodium intake.  She has significant daytime sleepiness.   Labs (5/22): Pro-BNP 2347, K 4.1, creatinine 1.46  ECG (3/22, personally reviewed): NSR, nonspecific ST changes  PMH: 1. Hyperlipidemia 2. HTN 3. CAD: CABG 2008 with LIMA-LAD, SVG-ramus, SVG-OM, SVG-PDA.  - Cardiolite (11/21): Small reversible inferolateral defect.  4. Chronic diastolic CHF: Echo (3/22) with EF 60-65%, normal RV.  - Multiple admission with CHF.  5. CKD: Stage 3.   SH: Lives in [place], retired from [employer], lives alone, has a son in [place].  Quit smoking in 2003, no ETOH.   Family History  Problem Relation Age of Onset  . Cancer Other        Family Hx of Cancer  . Coronary artery disease Other        Family Hx of CAD   ROS: All systems reviewed and negative except as per HPI.    Current Outpatient Medications  Medication Sig Dispense Refill  . ALPRAZolam (XANAX) 0.5 MG tablet Take 0.5 mg by mouth at bedtime as needed for anxiety or sleep.    Marland Kitchen atorvastatin (LIPITOR) 40 MG tablet Take 40 mg by mouth every evening.  0  . Cholecalciferol (VITAMIN D) 125 MCG (5000 UT) CAPS Take 5,000 Units by mouth daily with lunch.    . empagliflozin (JARDIANCE) 10 MG TABS tablet Take 1 tablet (10 mg total) by mouth daily before breakfast. 30 tablet 11  . isosorbide dinitrate (ISORDIL) 10 MG tablet Take 10 mg by mouth 3 (three) times daily.    Marland Kitchen lisinopril (ZESTRIL) 40 MG tablet TAKE 1/2 TABLET DAILY (Patient taking differently: Take 20 mg by mouth in the morning.) 45 tablet 1  . loratadine (CLARITIN) 10 MG tablet Take 10 mg by mouth daily as needed for allergies.    . metoprolol tartrate (LOPRESSOR) 25 MG tablet Take 1/2 (one-half) tablet by mouth twice daily (Patient taking differently: Take 12.5 mg by mouth 2 (two) times daily.) 90 tablet 1  . nitroGLYCERIN (NITROSTAT) 0.4 MG SL tablet DISSOLVE ONE TABLET UNDER THE TONGUE EVERY 5 MINUTES AS NEEDED FOR CHEST PAIN.  DO NOT EXCEED A TOTAL OF 3 DOSES IN 15 MINUTES 25 tablet 3  . Omega-3 Fatty Acids (OMEGA-3 FISH OIL PO) Take 3 tablets by mouth  in the morning.    Marland Kitchen omeprazole (PRILOSEC) 20 MG capsule Take 20 mg by mouth in the morning.    . OXYGEN Inhale into the lungs. As needed    . promethazine (PHENERGAN) 25 MG tablet Take 12.5 mg by mouth every 6 (six) hours as needed for vomiting or nausea.    . traMADol (ULTRAM) 50 MG tablet Take 50 mg by mouth 3 (three) times daily as needed (arthritis pain).    Marland Kitchen acetaminophen (TYLENOL) 500 MG tablet Take 500-1,000 mg by mouth every 6 (six) hours as needed (pain).    Marland Kitchen aspirin EC 81 MG tablet Take 81 mg by mouth every evening. Swallow whole.    Marland Kitchen HUMALOG 100 UNIT/ML injection Inject into the skin with breakfast, with lunch, and with evening meal. With insulin pump    . hydrALAZINE (APRESOLINE) 50  MG tablet Take 1.5 tablets (75 mg total) by mouth 3 (three) times daily. 405 tablet 3  . Insulin Disposable Pump (OMNIPOD DASH PODS, GEN 4,) MISC Take by mouth every 3 (three) days.    . Insulin Pen Needle (COMFORT TOUCH INSULIN PEN NEED) 31G X 5 MM MISC 1 each by Does not apply route in the morning, at noon, in the evening, and at bedtime. 100 each 0  . ipratropium (ATROVENT) 0.03 % nasal spray Place 1 spray into both nostrils 4 (four) times daily as needed (allergies).    . ondansetron (ZOFRAN) 4 MG tablet Take 4 mg by mouth every 6 (six) hours as needed for nausea.    Marland Kitchen torsemide (DEMADEX) 20 MG tablet Take 2 tablets (40 mg total) by mouth 2 (two) times daily. (MAY TAKE AN EXTRA 1/2 TAB - 10MG  - AS NEEDED FOR WEIGHT GAIN OF 3 LBS IN ONE DAY OR 5 LBS IN ONE WEEK) (Patient taking differently: Take 40 mg by mouth in the morning and at bedtime. (MAY TAKE AN EXTRA 1/2 TAB - 10MG  - AS NEEDED FOR WEIGHT GAIN OF 3 LBS IN ONE DAY OR 5 LBS IN ONE WEEK)) 145 tablet 11  . vitamin C (ASCORBIC ACID) 500 MG tablet Take 500 mg by mouth in the morning.     No current facility-administered medications for this encounter.   BP 140/60   Pulse (!) 55   Wt 90.5 kg (199 lb 9.6 oz)   SpO2 97% Comment: 2 L n/c  BMI 38.98 kg/m  General: NAD Neck: JVP 10-12, no thyromegaly or thyroid nodule.  Lungs: Clear to auscultation bilaterally with normal respiratory effort. CV: Nondisplaced PMI.  Heart regular S1/S2, no S3/S4, no murmur.  1+ ankle edema.  No carotid bruit.  Normal pedal pulses.  Abdomen: Soft, nontender, no hepatosplenomegaly, no distention.  Skin: Intact without lesions or rashes.  Neurologic: Alert and oriented x 3.  Psych: Normal affect. Extremities: No clubbing or cyanosis.  HEENT: Normal.   Assessment/Plan: 1. Chronic diastolic CHF: Echo in 3/22 with EF 60-65%, normal RV.  She has had 3 admissions in the last year with CHF.  Volume status has been poorly controlled at home.  NYHA class III symptoms,  she is significantly volume overloaded on exam today.  - Increase torsemide to 40 mg bid.  BMET/BNP today and BMET in 10 days.  - Add Jardiance 10 mg daily.  - RHC (see below, will be in association with coronary angiography).  2. HTN: BP not ideally controlled.  - Increase hydralazine to 75 mg tid.   - Continue lisinopril 20 mg daily.  3. CAD:  S/p CABG in 2008.  No cath since that time.  HS-TnI up to 1777 during 3/22 admission but no cath due to AKI.  She has prominent exertional dyspnea though no chest pain.  - Continue ASA 81 daily.  - Continue atorvastatin 40 mg daily.  - Continue metoprolol 12.5 bid.  - Given worsening exertional symptoms and recent significant troponin elevation, I think she needs right/left heart catheterization.  We discussed risks/benefits and she agrees to procedure.   4. Suspect OSA: I think she would eventually benefit from sleep study.  5. CKD stage 3: Last creatinine 1.46.  BMET today.   Marca Ancona 04/20/2021

## 2021-04-20 NOTE — Progress Notes (Signed)
PCP: Toma Deiters, MD Cardiology: Dr. Wyline Mood HF Cardiology: Dr. Shirlee Latch  75 y.o. with history of CAD, diastolic CHF, and poorly controlled HTN was referred by Dr. Wyline Mood for CHF evaluation. Patient had CABG in 2008.  She has poorly controlled HTN and CKD stage 3.  She was admitted in 10/21 with hypertensive urgency and CHF due to medication noncompliance.  She was admitted again in 3/22 with acute diastolic CHF, AKI to 2.9. HS-TnI was 1777 but cath was not done due to AKI.  Echo showed EF 60-65% with normal RV.  She was again admitted (this time to [facility]) in 5/22 with CHF.  No med changes were made.  Given multiple admission with CHF and difficulty managing volume, she was referred to CHF clinic.    BP is mildly elevated today, she has taken all her meds.  She is quite symptomatic, has been using oxygen since her 5/22 hospitalization.  No chest pain, but she gets very short of breath.  She tires easily doing chores around the house and is short of breath walking about 100 feet.  She has orthopnea.  She has significant lower extremity edema.  Dyspnea with most moderate activity.  She is trying to watch sodium intake.  She has significant daytime sleepiness.   Labs (5/22): Pro-BNP 2347, K 4.1, creatinine 1.46  ECG (3/22, personally reviewed): NSR, nonspecific ST changes  PMH: 1. Hyperlipidemia 2. HTN 3. CAD: CABG 2008 with LIMA-LAD, SVG-ramus, SVG-OM, SVG-PDA.  - Cardiolite (11/21): Small reversible inferolateral defect.  4. Chronic diastolic CHF: Echo (3/22) with EF 60-65%, normal RV.  - Multiple admission with CHF.  5. CKD: Stage 3.   SH: Lives in [place], retired from [employer], lives alone, has a son in [place].  Quit smoking in 2003, no ETOH.   Family History  Problem Relation Age of Onset  . Cancer Other        Family Hx of Cancer  . Coronary artery disease Other        Family Hx of CAD   ROS: All systems reviewed and negative except as per HPI.    Current Outpatient Medications  Medication Sig Dispense Refill  . ALPRAZolam (XANAX) 0.5 MG tablet Take 0.5 mg by mouth at bedtime as needed for anxiety or sleep.    Marland Kitchen atorvastatin (LIPITOR) 40 MG tablet Take 40 mg by mouth every evening.  0  . Cholecalciferol (VITAMIN D) 125 MCG (5000 UT) CAPS Take 5,000 Units by mouth daily with lunch.    . empagliflozin (JARDIANCE) 10 MG TABS tablet Take 1 tablet (10 mg total) by mouth daily before breakfast. 30 tablet 11  . isosorbide dinitrate (ISORDIL) 10 MG tablet Take 10 mg by mouth 3 (three) times daily.    Marland Kitchen lisinopril (ZESTRIL) 40 MG tablet TAKE 1/2 TABLET DAILY (Patient taking differently: Take 20 mg by mouth in the morning.) 45 tablet 1  . loratadine (CLARITIN) 10 MG tablet Take 10 mg by mouth daily as needed for allergies.    . metoprolol tartrate (LOPRESSOR) 25 MG tablet Take 1/2 (one-half) tablet by mouth twice daily (Patient taking differently: Take 12.5 mg by mouth 2 (two) times daily.) 90 tablet 1  . nitroGLYCERIN (NITROSTAT) 0.4 MG SL tablet DISSOLVE ONE TABLET UNDER THE TONGUE EVERY 5 MINUTES AS NEEDED FOR CHEST PAIN.  DO NOT EXCEED A TOTAL OF 3 DOSES IN 15 MINUTES 25 tablet 3  . Omega-3 Fatty Acids (OMEGA-3 FISH OIL PO) Take 3 tablets by mouth  in the morning.    Marland Kitchen omeprazole (PRILOSEC) 20 MG capsule Take 20 mg by mouth in the morning.    . OXYGEN Inhale into the lungs. As needed    . promethazine (PHENERGAN) 25 MG tablet Take 12.5 mg by mouth every 6 (six) hours as needed for vomiting or nausea.    . traMADol (ULTRAM) 50 MG tablet Take 50 mg by mouth 3 (three) times daily as needed (arthritis pain).    Marland Kitchen acetaminophen (TYLENOL) 500 MG tablet Take 500-1,000 mg by mouth every 6 (six) hours as needed (pain).    Marland Kitchen aspirin EC 81 MG tablet Take 81 mg by mouth every evening. Swallow whole.    Marland Kitchen HUMALOG 100 UNIT/ML injection Inject into the skin with breakfast, with lunch, and with evening meal. With insulin pump    . hydrALAZINE (APRESOLINE) 50  MG tablet Take 1.5 tablets (75 mg total) by mouth 3 (three) times daily. 405 tablet 3  . Insulin Disposable Pump (OMNIPOD DASH PODS, GEN 4,) MISC Take by mouth every 3 (three) days.    . Insulin Pen Needle (COMFORT TOUCH INSULIN PEN NEED) 31G X 5 MM MISC 1 each by Does not apply route in the morning, at noon, in the evening, and at bedtime. 100 each 0  . ipratropium (ATROVENT) 0.03 % nasal spray Place 1 spray into both nostrils 4 (four) times daily as needed (allergies).    . ondansetron (ZOFRAN) 4 MG tablet Take 4 mg by mouth every 6 (six) hours as needed for nausea.    Marland Kitchen torsemide (DEMADEX) 20 MG tablet Take 2 tablets (40 mg total) by mouth 2 (two) times daily. (MAY TAKE AN EXTRA 1/2 TAB - 10MG  - AS NEEDED FOR WEIGHT GAIN OF 3 LBS IN ONE DAY OR 5 LBS IN ONE WEEK) (Patient taking differently: Take 40 mg by mouth in the morning and at bedtime. (MAY TAKE AN EXTRA 1/2 TAB - 10MG  - AS NEEDED FOR WEIGHT GAIN OF 3 LBS IN ONE DAY OR 5 LBS IN ONE WEEK)) 145 tablet 11  . vitamin C (ASCORBIC ACID) 500 MG tablet Take 500 mg by mouth in the morning.     No current facility-administered medications for this encounter.   BP 140/60   Pulse (!) 55   Wt 90.5 kg (199 lb 9.6 oz)   SpO2 97% Comment: 2 L n/c  BMI 38.98 kg/m  General: NAD Neck: JVP 10-12, no thyromegaly or thyroid nodule.  Lungs: Clear to auscultation bilaterally with normal respiratory effort. CV: Nondisplaced PMI.  Heart regular S1/S2, no S3/S4, no murmur.  1+ ankle edema.  No carotid bruit.  Normal pedal pulses.  Abdomen: Soft, nontender, no hepatosplenomegaly, no distention.  Skin: Intact without lesions or rashes.  Neurologic: Alert and oriented x 3.  Psych: Normal affect. Extremities: No clubbing or cyanosis.  HEENT: Normal.   Assessment/Plan: 1. Chronic diastolic CHF: Echo in 3/22 with EF 60-65%, normal RV.  She has had 3 admissions in the last year with CHF.  Volume status has been poorly controlled at home.  NYHA class III symptoms,  she is significantly volume overloaded on exam today.  - Increase torsemide to 40 mg bid.  BMET/BNP today and BMET in 10 days.  - Add Jardiance 10 mg daily.  - RHC (see below, will be in association with coronary angiography).  2. HTN: BP not ideally controlled.  - Increase hydralazine to 75 mg tid.   - Continue lisinopril 20 mg daily.  3. CAD:  S/p CABG in 2008.  No cath since that time.  HS-TnI up to 1777 during 3/22 admission but no cath due to AKI.  She has prominent exertional dyspnea though no chest pain.  - Continue ASA 81 daily.  - Continue atorvastatin 40 mg daily.  - Continue metoprolol 12.5 bid.  - Given worsening exertional symptoms and recent significant troponin elevation, I think she needs right/left heart catheterization.  We discussed risks/benefits and she agrees to procedure.   4. Suspect OSA: I think she would eventually benefit from sleep study.  5. CKD stage 3: Last creatinine 1.46.  BMET today.   Marca Ancona 04/20/2021

## 2021-04-21 ENCOUNTER — Other Ambulatory Visit (HOSPITAL_COMMUNITY): Payer: Self-pay | Admitting: *Deleted

## 2021-04-21 DIAGNOSIS — I2510 Atherosclerotic heart disease of native coronary artery without angina pectoris: Secondary | ICD-10-CM

## 2021-04-21 DIAGNOSIS — R079 Chest pain, unspecified: Secondary | ICD-10-CM

## 2021-04-21 DIAGNOSIS — I5032 Chronic diastolic (congestive) heart failure: Secondary | ICD-10-CM

## 2021-04-22 ENCOUNTER — Telehealth: Payer: Self-pay | Admitting: Cardiology

## 2021-04-22 ENCOUNTER — Telehealth (HOSPITAL_COMMUNITY): Payer: Self-pay | Admitting: *Deleted

## 2021-04-22 DIAGNOSIS — E7800 Pure hypercholesterolemia, unspecified: Secondary | ICD-10-CM | POA: Diagnosis not present

## 2021-04-22 DIAGNOSIS — D649 Anemia, unspecified: Secondary | ICD-10-CM | POA: Diagnosis not present

## 2021-04-22 DIAGNOSIS — I509 Heart failure, unspecified: Secondary | ICD-10-CM | POA: Diagnosis not present

## 2021-04-22 DIAGNOSIS — Z87891 Personal history of nicotine dependence: Secondary | ICD-10-CM | POA: Diagnosis not present

## 2021-04-22 DIAGNOSIS — Z9981 Dependence on supplemental oxygen: Secondary | ICD-10-CM | POA: Diagnosis not present

## 2021-04-22 DIAGNOSIS — J449 Chronic obstructive pulmonary disease, unspecified: Secondary | ICD-10-CM | POA: Diagnosis not present

## 2021-04-22 DIAGNOSIS — E119 Type 2 diabetes mellitus without complications: Secondary | ICD-10-CM | POA: Diagnosis not present

## 2021-04-22 DIAGNOSIS — I110 Hypertensive heart disease with heart failure: Secondary | ICD-10-CM | POA: Diagnosis not present

## 2021-04-22 DIAGNOSIS — Z7982 Long term (current) use of aspirin: Secondary | ICD-10-CM | POA: Diagnosis not present

## 2021-04-22 DIAGNOSIS — Z951 Presence of aortocoronary bypass graft: Secondary | ICD-10-CM | POA: Diagnosis not present

## 2021-04-22 DIAGNOSIS — I2510 Atherosclerotic heart disease of native coronary artery without angina pectoris: Secondary | ICD-10-CM | POA: Diagnosis not present

## 2021-04-22 DIAGNOSIS — G5602 Carpal tunnel syndrome, left upper limb: Secondary | ICD-10-CM | POA: Diagnosis not present

## 2021-04-22 DIAGNOSIS — Z794 Long term (current) use of insulin: Secondary | ICD-10-CM | POA: Diagnosis not present

## 2021-04-22 NOTE — Telephone Encounter (Signed)
Lab order faxed to [facility] per pts request   Fax number [phone]

## 2021-04-22 NOTE — Telephone Encounter (Signed)
Pt is at [facility] to have lab work done, please fax lab orders [phone]

## 2021-04-26 DIAGNOSIS — I5022 Chronic systolic (congestive) heart failure: Secondary | ICD-10-CM | POA: Diagnosis not present

## 2021-04-27 DIAGNOSIS — I110 Hypertensive heart disease with heart failure: Secondary | ICD-10-CM | POA: Diagnosis not present

## 2021-04-27 DIAGNOSIS — Z794 Long term (current) use of insulin: Secondary | ICD-10-CM | POA: Diagnosis not present

## 2021-04-27 DIAGNOSIS — Z87891 Personal history of nicotine dependence: Secondary | ICD-10-CM | POA: Diagnosis not present

## 2021-04-27 DIAGNOSIS — Z7982 Long term (current) use of aspirin: Secondary | ICD-10-CM | POA: Diagnosis not present

## 2021-04-27 DIAGNOSIS — Z9981 Dependence on supplemental oxygen: Secondary | ICD-10-CM | POA: Diagnosis not present

## 2021-04-27 DIAGNOSIS — G5602 Carpal tunnel syndrome, left upper limb: Secondary | ICD-10-CM | POA: Diagnosis not present

## 2021-04-27 DIAGNOSIS — I509 Heart failure, unspecified: Secondary | ICD-10-CM | POA: Diagnosis not present

## 2021-04-27 DIAGNOSIS — E7800 Pure hypercholesterolemia, unspecified: Secondary | ICD-10-CM | POA: Diagnosis not present

## 2021-04-27 DIAGNOSIS — Z951 Presence of aortocoronary bypass graft: Secondary | ICD-10-CM | POA: Diagnosis not present

## 2021-04-27 DIAGNOSIS — I2510 Atherosclerotic heart disease of native coronary artery without angina pectoris: Secondary | ICD-10-CM | POA: Diagnosis not present

## 2021-04-27 DIAGNOSIS — E119 Type 2 diabetes mellitus without complications: Secondary | ICD-10-CM | POA: Diagnosis not present

## 2021-04-27 DIAGNOSIS — D649 Anemia, unspecified: Secondary | ICD-10-CM | POA: Diagnosis not present

## 2021-04-27 DIAGNOSIS — J449 Chronic obstructive pulmonary disease, unspecified: Secondary | ICD-10-CM | POA: Diagnosis not present

## 2021-04-28 ENCOUNTER — Ambulatory Visit (HOSPITAL_COMMUNITY)
Admission: RE | Admit: 2021-04-28 | Discharge: 2021-04-28 | Disposition: A | Discharge status: Home / Self Care | Payer: Medicare Other | Attending: Cardiology | Admitting: Cardiology

## 2021-04-28 ENCOUNTER — Other Ambulatory Visit: Payer: Self-pay

## 2021-04-28 ENCOUNTER — Ambulatory Visit (HOSPITAL_COMMUNITY)
Admission: RE | Disposition: A | Discharge status: Home / Self Care | Payer: Self-pay | Source: Home / Self Care | Attending: Cardiology

## 2021-04-28 DIAGNOSIS — R079 Chest pain, unspecified: Secondary | ICD-10-CM

## 2021-04-28 DIAGNOSIS — R0600 Dyspnea, unspecified: Secondary | ICD-10-CM | POA: Diagnosis not present

## 2021-04-28 DIAGNOSIS — I130 Hypertensive heart and chronic kidney disease with heart failure and stage 1 through stage 4 chronic kidney disease, or unspecified chronic kidney disease: Secondary | ICD-10-CM | POA: Diagnosis not present

## 2021-04-28 DIAGNOSIS — Z87891 Personal history of nicotine dependence: Secondary | ICD-10-CM | POA: Insufficient documentation

## 2021-04-28 DIAGNOSIS — Z8249 Family history of ischemic heart disease and other diseases of the circulatory system: Secondary | ICD-10-CM | POA: Diagnosis not present

## 2021-04-28 DIAGNOSIS — R778 Other specified abnormalities of plasma proteins: Secondary | ICD-10-CM | POA: Diagnosis not present

## 2021-04-28 DIAGNOSIS — R0609 Other forms of dyspnea: Secondary | ICD-10-CM | POA: Insufficient documentation

## 2021-04-28 DIAGNOSIS — R519 Headache, unspecified: Secondary | ICD-10-CM | POA: Diagnosis not present

## 2021-04-28 DIAGNOSIS — Z79899 Other long term (current) drug therapy: Secondary | ICD-10-CM | POA: Diagnosis not present

## 2021-04-28 DIAGNOSIS — Z7984 Long term (current) use of oral hypoglycemic drugs: Secondary | ICD-10-CM | POA: Diagnosis not present

## 2021-04-28 DIAGNOSIS — N183 Chronic kidney disease, stage 3 unspecified: Secondary | ICD-10-CM | POA: Diagnosis not present

## 2021-04-28 DIAGNOSIS — Z7982 Long term (current) use of aspirin: Secondary | ICD-10-CM | POA: Insufficient documentation

## 2021-04-28 DIAGNOSIS — E119 Type 2 diabetes mellitus without complications: Secondary | ICD-10-CM | POA: Diagnosis not present

## 2021-04-28 DIAGNOSIS — I5032 Chronic diastolic (congestive) heart failure: Secondary | ICD-10-CM | POA: Diagnosis not present

## 2021-04-28 DIAGNOSIS — I10 Essential (primary) hypertension: Secondary | ICD-10-CM | POA: Diagnosis not present

## 2021-04-28 DIAGNOSIS — Z794 Long term (current) use of insulin: Secondary | ICD-10-CM | POA: Diagnosis not present

## 2021-04-28 DIAGNOSIS — Z951 Presence of aortocoronary bypass graft: Secondary | ICD-10-CM | POA: Diagnosis not present

## 2021-04-28 DIAGNOSIS — I2510 Atherosclerotic heart disease of native coronary artery without angina pectoris: Secondary | ICD-10-CM | POA: Diagnosis not present

## 2021-04-28 HISTORY — PX: RIGHT/LEFT HEART CATH AND CORONARY/GRAFT ANGIOGRAPHY: CATH118267

## 2021-04-28 LAB — POCT I-STAT EG7
Acid-Base Excess: 6 mmol/L — ABNORMAL HIGH (ref 0.0–2.0)
Acid-Base Excess: 6 mmol/L — ABNORMAL HIGH (ref 0.0–2.0)
Bicarbonate: 31.3 mmol/L — ABNORMAL HIGH (ref 20.0–28.0)
Bicarbonate: 31.4 mmol/L — ABNORMAL HIGH (ref 20.0–28.0)
Calcium, Ion: 1.16 mmol/L (ref 1.15–1.40)
Calcium, Ion: 1.19 mmol/L (ref 1.15–1.40)
HCT: 28 % — ABNORMAL LOW (ref 36.0–46.0)
HCT: 28 % — ABNORMAL LOW (ref 36.0–46.0)
Hemoglobin: 9.5 g/dL — ABNORMAL LOW (ref 12.0–15.0)
Hemoglobin: 9.5 g/dL — ABNORMAL LOW (ref 12.0–15.0)
O2 Saturation: 73 %
O2 Saturation: 74 %
Potassium: 3.7 mmol/L (ref 3.5–5.1)
Potassium: 3.7 mmol/L (ref 3.5–5.1)
Sodium: 140 mmol/L (ref 135–145)
Sodium: 141 mmol/L (ref 135–145)
TCO2: 33 mmol/L — ABNORMAL HIGH (ref 22–32)
TCO2: 33 mmol/L — ABNORMAL HIGH (ref 22–32)
pCO2, Ven: 49.7 mmHg (ref 44.0–60.0)
pCO2, Ven: 50.3 mmHg (ref 44.0–60.0)
pH, Ven: 7.404 (ref 7.250–7.430)
pH, Ven: 7.407 (ref 7.250–7.430)
pO2, Ven: 39 mmHg (ref 32.0–45.0)
pO2, Ven: 40 mmHg (ref 32.0–45.0)

## 2021-04-28 LAB — BASIC METABOLIC PANEL
Anion gap: 10 (ref 5–15)
BUN: 25 mg/dL — ABNORMAL HIGH (ref 8–23)
CO2: 28 mmol/L (ref 22–32)
Calcium: 8.9 mg/dL (ref 8.9–10.3)
Chloride: 100 mmol/L (ref 98–111)
Creatinine, Ser: 1.75 mg/dL — ABNORMAL HIGH (ref 0.44–1.00)
GFR, Estimated: 31 mL/min — ABNORMAL LOW (ref >60)
Glucose, Bld: 150 mg/dL — ABNORMAL HIGH (ref 70–99)
Potassium: 3.9 mmol/L (ref 3.5–5.1)
Sodium: 138 mmol/L (ref 135–145)

## 2021-04-28 LAB — GLUCOSE, CAPILLARY: Glucose-Capillary: 147 mg/dL — ABNORMAL HIGH (ref 70–99)

## 2021-04-28 SURGERY — RIGHT/LEFT HEART CATH AND CORONARY/GRAFT ANGIOGRAPHY
Anesthesia: LOCAL

## 2021-04-28 MED ORDER — LIDOCAINE HCL (PF) 1 % IJ SOLN
INTRAMUSCULAR | Status: AC
  Filled 2021-04-28: qty 30

## 2021-04-28 MED ORDER — FENTANYL CITRATE (PF) 100 MCG/2ML IJ SOLN
INTRAMUSCULAR | Status: AC
  Filled 2021-04-28: qty 2

## 2021-04-28 MED ORDER — SODIUM CHLORIDE 0.9% FLUSH: 3.0000 mL | Freq: Two times a day (BID) | INTRAVENOUS | Status: DC

## 2021-04-28 MED ORDER — VERAPAMIL HCL 2.5 MG/ML IV SOLN
INTRAVENOUS | Status: AC
  Filled 2021-04-28: qty 2

## 2021-04-28 MED ORDER — ACETAMINOPHEN 325 MG PO TABS: 650.0000 mg | ORAL_TABLET | ORAL | Status: DC | PRN

## 2021-04-28 MED ORDER — ASPIRIN 81 MG PO CHEW
CHEWABLE_TABLET | ORAL | Status: AC
  Filled 2021-04-28: qty 1

## 2021-04-28 MED ORDER — HEPARIN SODIUM (PORCINE) 1000 UNIT/ML IJ SOLN
INTRAMUSCULAR | Status: AC
  Filled 2021-04-28: qty 1

## 2021-04-28 MED ORDER — LABETALOL HCL 5 MG/ML IV SOLN: 10.0000 mg | INTRAVENOUS | Status: DC | PRN

## 2021-04-28 MED ORDER — MIDAZOLAM HCL 2 MG/2ML IJ SOLN
INTRAMUSCULAR | Status: DC | PRN
  Administered 2021-04-28 (×2): 1 mg via INTRAVENOUS

## 2021-04-28 MED ORDER — SODIUM CHLORIDE 0.9 % IV SOLN: INTRAVENOUS | Status: DC

## 2021-04-28 MED ORDER — ONDANSETRON HCL 4 MG/2ML IJ SOLN: 4.0000 mg | Freq: Four times a day (QID) | INTRAMUSCULAR | Status: DC | PRN

## 2021-04-28 MED ORDER — IOHEXOL 350 MG/ML SOLN
INTRAVENOUS | Status: DC | PRN
  Administered 2021-04-28: 70 mL via INTRA_ARTERIAL

## 2021-04-28 MED ORDER — FENTANYL CITRATE (PF) 100 MCG/2ML IJ SOLN
INTRAMUSCULAR | Status: DC | PRN
  Administered 2021-04-28 (×2): 25 ug via INTRAVENOUS

## 2021-04-28 MED ORDER — HEPARIN (PORCINE) IN NACL 1000-0.9 UT/500ML-% IV SOLN
INTRAVENOUS | Status: DC | PRN
  Administered 2021-04-28 (×2): 500 mL

## 2021-04-28 MED ORDER — LIDOCAINE HCL (PF) 1 % IJ SOLN
INTRAMUSCULAR | Status: DC | PRN
  Administered 2021-04-28: 2 mL
  Administered 2021-04-28: 15 mL

## 2021-04-28 MED ORDER — HEPARIN (PORCINE) IN NACL 1000-0.9 UT/500ML-% IV SOLN
INTRAVENOUS | Status: AC
  Filled 2021-04-28: qty 1000

## 2021-04-28 MED ORDER — MIDAZOLAM HCL 2 MG/2ML IJ SOLN
INTRAMUSCULAR | Status: AC
  Filled 2021-04-28: qty 2

## 2021-04-28 MED ORDER — SODIUM CHLORIDE 0.9 % IV SOLN: 250.0000 mL | INTRAVENOUS | Status: DC | PRN

## 2021-04-28 MED ORDER — SODIUM CHLORIDE 0.9% FLUSH: 3.0000 mL | INTRAVENOUS | Status: DC | PRN

## 2021-04-28 MED ORDER — ASPIRIN 81 MG PO CHEW
81.0000 mg | CHEWABLE_TABLET | Freq: Once | ORAL | Status: AC
  Administered 2021-04-28: 81 mg via ORAL

## 2021-04-28 MED ORDER — HYDRALAZINE HCL 20 MG/ML IJ SOLN: 10.0000 mg | INTRAMUSCULAR | Status: DC | PRN

## 2021-04-28 SURGICAL SUPPLY — 10 items
CATH INFINITI 5 FR 3DRC (CATHETERS) ×1 IMPLANT
CATH INFINITI 5FR MULTPACK ANG (CATHETERS) ×2 IMPLANT
CATH SWAN GANZ 7F STRAIGHT (CATHETERS) ×1 IMPLANT
CLOSURE MYNX CONTROL 5F (Vascular Products) ×1 IMPLANT
GLIDESHEATH SLENDER 7FR .021G (SHEATH) ×2 IMPLANT
KIT HEART LEFT (KITS) ×2 IMPLANT
PACK CARDIAC CATHETERIZATION (CUSTOM PROCEDURE TRAY) ×2 IMPLANT
SHEATH PINNACLE 5F 10CM (SHEATH) ×1 IMPLANT
TRANSDUCER W/STOPCOCK (MISCELLANEOUS) ×2 IMPLANT
WIRE EMERALD 3MM-J .035X150CM (WIRE) ×1 IMPLANT

## 2021-04-28 NOTE — Progress Notes (Addendum)
Report was received from Charna Busman, Rn Discharge instructions reviewed with pt and sister in law voices understanding.

## 2021-04-28 NOTE — Interval H&P Note (Signed)
History and Physical Interval Note:  04/28/2021 3:21 PM  [name]  has presented today for surgery, with the diagnosis of chf.  The various methods of treatment have been discussed with the patient and family. After consideration of risks, benefits and other options for treatment, the patient has consented to  Procedure(s): RIGHT/LEFT HEART CATH AND CORONARY/GRAFT ANGIOGRAPHY (N/A) as a surgical intervention.  The patient's history has been reviewed, patient examined, no change in status, stable for surgery.  I have reviewed the patient's chart and labs.  Questions were answered to the patient's satisfaction.     [name] [employer]

## 2021-04-28 NOTE — Progress Notes (Signed)
Ambulated to the bathroom to void. tol well no bleeding noted before or after ambulation. 

## 2021-04-28 NOTE — Discharge Instructions (Signed)

## 2021-04-29 ENCOUNTER — Encounter (HOSPITAL_COMMUNITY): Payer: Self-pay | Admitting: Cardiology

## 2021-04-29 MED FILL — Verapamil HCl IV Soln 2.5 MG/ML: INTRAVENOUS | Qty: 2 | Status: AC

## 2021-05-03 DIAGNOSIS — Z87891 Personal history of nicotine dependence: Secondary | ICD-10-CM | POA: Diagnosis not present

## 2021-05-03 DIAGNOSIS — Z951 Presence of aortocoronary bypass graft: Secondary | ICD-10-CM | POA: Diagnosis not present

## 2021-05-03 DIAGNOSIS — I509 Heart failure, unspecified: Secondary | ICD-10-CM | POA: Diagnosis not present

## 2021-05-03 DIAGNOSIS — Z9981 Dependence on supplemental oxygen: Secondary | ICD-10-CM | POA: Diagnosis not present

## 2021-05-03 DIAGNOSIS — E7800 Pure hypercholesterolemia, unspecified: Secondary | ICD-10-CM | POA: Diagnosis not present

## 2021-05-03 DIAGNOSIS — E119 Type 2 diabetes mellitus without complications: Secondary | ICD-10-CM | POA: Diagnosis not present

## 2021-05-03 DIAGNOSIS — I2510 Atherosclerotic heart disease of native coronary artery without angina pectoris: Secondary | ICD-10-CM | POA: Diagnosis not present

## 2021-05-03 DIAGNOSIS — J449 Chronic obstructive pulmonary disease, unspecified: Secondary | ICD-10-CM | POA: Diagnosis not present

## 2021-05-03 DIAGNOSIS — G5602 Carpal tunnel syndrome, left upper limb: Secondary | ICD-10-CM | POA: Diagnosis not present

## 2021-05-03 DIAGNOSIS — Z7982 Long term (current) use of aspirin: Secondary | ICD-10-CM | POA: Diagnosis not present

## 2021-05-03 DIAGNOSIS — I110 Hypertensive heart disease with heart failure: Secondary | ICD-10-CM | POA: Diagnosis not present

## 2021-05-03 DIAGNOSIS — Z794 Long term (current) use of insulin: Secondary | ICD-10-CM | POA: Diagnosis not present

## 2021-05-03 DIAGNOSIS — D649 Anemia, unspecified: Secondary | ICD-10-CM | POA: Diagnosis not present

## 2021-05-04 DIAGNOSIS — Z7982 Long term (current) use of aspirin: Secondary | ICD-10-CM | POA: Diagnosis not present

## 2021-05-04 DIAGNOSIS — G5602 Carpal tunnel syndrome, left upper limb: Secondary | ICD-10-CM | POA: Diagnosis not present

## 2021-05-04 DIAGNOSIS — I2510 Atherosclerotic heart disease of native coronary artery without angina pectoris: Secondary | ICD-10-CM | POA: Diagnosis not present

## 2021-05-04 DIAGNOSIS — I10 Essential (primary) hypertension: Secondary | ICD-10-CM | POA: Diagnosis not present

## 2021-05-04 DIAGNOSIS — R519 Headache, unspecified: Secondary | ICD-10-CM | POA: Diagnosis not present

## 2021-05-04 DIAGNOSIS — E119 Type 2 diabetes mellitus without complications: Secondary | ICD-10-CM | POA: Diagnosis not present

## 2021-05-04 DIAGNOSIS — I110 Hypertensive heart disease with heart failure: Secondary | ICD-10-CM | POA: Diagnosis not present

## 2021-05-04 DIAGNOSIS — I509 Heart failure, unspecified: Secondary | ICD-10-CM | POA: Diagnosis not present

## 2021-05-04 DIAGNOSIS — D649 Anemia, unspecified: Secondary | ICD-10-CM | POA: Diagnosis not present

## 2021-05-04 DIAGNOSIS — J449 Chronic obstructive pulmonary disease, unspecified: Secondary | ICD-10-CM | POA: Diagnosis not present

## 2021-05-04 DIAGNOSIS — R778 Other specified abnormalities of plasma proteins: Secondary | ICD-10-CM | POA: Diagnosis not present

## 2021-05-04 DIAGNOSIS — E7800 Pure hypercholesterolemia, unspecified: Secondary | ICD-10-CM | POA: Diagnosis not present

## 2021-05-04 DIAGNOSIS — Z9981 Dependence on supplemental oxygen: Secondary | ICD-10-CM | POA: Diagnosis not present

## 2021-05-04 DIAGNOSIS — Z951 Presence of aortocoronary bypass graft: Secondary | ICD-10-CM | POA: Diagnosis not present

## 2021-05-04 DIAGNOSIS — Z794 Long term (current) use of insulin: Secondary | ICD-10-CM | POA: Diagnosis not present

## 2021-05-04 DIAGNOSIS — Z87891 Personal history of nicotine dependence: Secondary | ICD-10-CM | POA: Diagnosis not present

## 2021-05-11 DIAGNOSIS — E7800 Pure hypercholesterolemia, unspecified: Secondary | ICD-10-CM | POA: Diagnosis not present

## 2021-05-11 DIAGNOSIS — Z794 Long term (current) use of insulin: Secondary | ICD-10-CM | POA: Diagnosis not present

## 2021-05-11 DIAGNOSIS — I2510 Atherosclerotic heart disease of native coronary artery without angina pectoris: Secondary | ICD-10-CM | POA: Diagnosis not present

## 2021-05-11 DIAGNOSIS — J449 Chronic obstructive pulmonary disease, unspecified: Secondary | ICD-10-CM | POA: Diagnosis not present

## 2021-05-11 DIAGNOSIS — I110 Hypertensive heart disease with heart failure: Secondary | ICD-10-CM | POA: Diagnosis not present

## 2021-05-11 DIAGNOSIS — Z951 Presence of aortocoronary bypass graft: Secondary | ICD-10-CM | POA: Diagnosis not present

## 2021-05-11 DIAGNOSIS — G5602 Carpal tunnel syndrome, left upper limb: Secondary | ICD-10-CM | POA: Diagnosis not present

## 2021-05-11 DIAGNOSIS — Z87891 Personal history of nicotine dependence: Secondary | ICD-10-CM | POA: Diagnosis not present

## 2021-05-11 DIAGNOSIS — Z9981 Dependence on supplemental oxygen: Secondary | ICD-10-CM | POA: Diagnosis not present

## 2021-05-11 DIAGNOSIS — Z7982 Long term (current) use of aspirin: Secondary | ICD-10-CM | POA: Diagnosis not present

## 2021-05-11 DIAGNOSIS — D649 Anemia, unspecified: Secondary | ICD-10-CM | POA: Diagnosis not present

## 2021-05-11 DIAGNOSIS — I509 Heart failure, unspecified: Secondary | ICD-10-CM | POA: Diagnosis not present

## 2021-05-11 DIAGNOSIS — E119 Type 2 diabetes mellitus without complications: Secondary | ICD-10-CM | POA: Diagnosis not present

## 2021-05-12 ENCOUNTER — Ambulatory Visit (HOSPITAL_COMMUNITY)
Admission: RE | Admit: 2021-05-12 | Discharge: 2021-05-12 | Disposition: A | Discharge status: Home / Self Care | Payer: Medicare Other | Source: Ambulatory Visit | Attending: Cardiology | Admitting: Cardiology

## 2021-05-12 ENCOUNTER — Other Ambulatory Visit: Payer: Self-pay

## 2021-05-12 VITALS — BP 128/48 | HR 70 | Wt 199.8 lb

## 2021-05-12 DIAGNOSIS — Z951 Presence of aortocoronary bypass graft: Secondary | ICD-10-CM | POA: Insufficient documentation

## 2021-05-12 DIAGNOSIS — Z9981 Dependence on supplemental oxygen: Secondary | ICD-10-CM | POA: Insufficient documentation

## 2021-05-12 DIAGNOSIS — I5032 Chronic diastolic (congestive) heart failure: Secondary | ICD-10-CM | POA: Diagnosis not present

## 2021-05-12 DIAGNOSIS — I2510 Atherosclerotic heart disease of native coronary artery without angina pectoris: Secondary | ICD-10-CM | POA: Diagnosis not present

## 2021-05-12 DIAGNOSIS — Z87891 Personal history of nicotine dependence: Secondary | ICD-10-CM | POA: Insufficient documentation

## 2021-05-12 DIAGNOSIS — Z7982 Long term (current) use of aspirin: Secondary | ICD-10-CM | POA: Diagnosis not present

## 2021-05-12 DIAGNOSIS — Z7901 Long term (current) use of anticoagulants: Secondary | ICD-10-CM | POA: Diagnosis not present

## 2021-05-12 DIAGNOSIS — Z79899 Other long term (current) drug therapy: Secondary | ICD-10-CM | POA: Diagnosis not present

## 2021-05-12 DIAGNOSIS — I509 Heart failure, unspecified: Secondary | ICD-10-CM | POA: Diagnosis not present

## 2021-05-12 DIAGNOSIS — R0609 Other forms of dyspnea: Secondary | ICD-10-CM | POA: Diagnosis not present

## 2021-05-12 DIAGNOSIS — I130 Hypertensive heart and chronic kidney disease with heart failure and stage 1 through stage 4 chronic kidney disease, or unspecified chronic kidney disease: Secondary | ICD-10-CM | POA: Diagnosis not present

## 2021-05-12 DIAGNOSIS — E7800 Pure hypercholesterolemia, unspecified: Secondary | ICD-10-CM | POA: Diagnosis not present

## 2021-05-12 DIAGNOSIS — N183 Chronic kidney disease, stage 3 unspecified: Secondary | ICD-10-CM | POA: Insufficient documentation

## 2021-05-12 DIAGNOSIS — R0789 Other chest pain: Secondary | ICD-10-CM | POA: Diagnosis not present

## 2021-05-12 DIAGNOSIS — E119 Type 2 diabetes mellitus without complications: Secondary | ICD-10-CM | POA: Diagnosis not present

## 2021-05-12 DIAGNOSIS — D649 Anemia, unspecified: Secondary | ICD-10-CM | POA: Diagnosis not present

## 2021-05-12 DIAGNOSIS — G5602 Carpal tunnel syndrome, left upper limb: Secondary | ICD-10-CM | POA: Diagnosis not present

## 2021-05-12 DIAGNOSIS — J449 Chronic obstructive pulmonary disease, unspecified: Secondary | ICD-10-CM | POA: Diagnosis not present

## 2021-05-12 DIAGNOSIS — Z794 Long term (current) use of insulin: Secondary | ICD-10-CM | POA: Diagnosis not present

## 2021-05-12 DIAGNOSIS — I110 Hypertensive heart disease with heart failure: Secondary | ICD-10-CM | POA: Diagnosis not present

## 2021-05-12 DIAGNOSIS — R0602 Shortness of breath: Secondary | ICD-10-CM | POA: Diagnosis not present

## 2021-05-12 LAB — BASIC METABOLIC PANEL
Anion gap: 11 (ref 5–15)
BUN: 26 mg/dL — ABNORMAL HIGH (ref 8–23)
CO2: 31 mmol/L (ref 22–32)
Calcium: 9.1 mg/dL (ref 8.9–10.3)
Chloride: 95 mmol/L — ABNORMAL LOW (ref 98–111)
Creatinine, Ser: 2.01 mg/dL — ABNORMAL HIGH (ref 0.44–1.00)
GFR, Estimated: 26 mL/min — ABNORMAL LOW (ref >60)
Glucose, Bld: 140 mg/dL — ABNORMAL HIGH (ref 70–99)
Potassium: 4.5 mmol/L (ref 3.5–5.1)
Sodium: 137 mmol/L (ref 135–145)

## 2021-05-12 MED ORDER — TORSEMIDE 20 MG PO TABS: ORAL_TABLET | ORAL | 0 refills | Status: DC

## 2021-05-12 NOTE — Patient Instructions (Signed)
INCREASE Torsemide to 60 mg, twice a day for 3 days then resume 60 mg in the AM and 40 mg in the PM  Labs today We will only contact you if something comes back abnormal or we need to make some changes. Otherwise no news is good news!  Labs needed in 7-10 days  Your physician recommends that you schedule a follow-up appointment in: 4 weeks  in the Advanced Practitioners (PA/NP) Clinic

## 2021-05-12 NOTE — Progress Notes (Signed)
PCP: Toma Deiters, MD Cardiology: Dr. Wyline Mood HF Cardiology: Dr. Shirlee Latch  75 y.o. with history of CAD, diastolic CHF, and poorly controlled HTN was referred by Dr. Wyline Mood for CHF evaluation. Patient had CABG in 2008.  She has poorly controlled HTN and CKD stage 3.  She was admitted in 10/21 with hypertensive urgency and CHF due to medication noncompliance.  She was admitted again in 3/22 with acute diastolic CHF, AKI to 2.9. HS-TnI was 1777 but cath was not done due to AKI.  Echo showed EF 60-65% with normal RV.  She was again admitted (this time to [facility]) in 5/22 with CHF.  No med changes were made.  Given multiple admission with CHF and difficulty managing volume, she was referred to CHF clinic.  Torsemide was increased and Jardiance was started.   LHC/RHC in 6/22 showed mildly elevated RA pressure, normal PCWP.  LIMA-LAD and SVG-PDA were patent, sequential SVG-OM and ramus only touched down on OM.  Native RCA has a heavily calcified ostium with 95% stenosis, very difficult to seat the catheter due to calcification.  The native PDA is occluded at the ostium, and SVG-PDA only fills the PDA (does not back-fill to PLV and RCA).  Therefore, PLV territory is likely jeopardized by the ostial RCA stenosis. I reviewed the films with Dr. [place].  Potentially could intervene on ostial RCA using atherectomy (would be difficult).  As patient's primary symptoms are CHF-related and this is improved with diuresis, will manage medically now with option for PCI if she develops chest pain, etc.  Patient returns for followup of CHF.  She feels better on higher torsemide dose.  Still short of breath after walking about 100 feet.  Walked into today without problem.  No exertional chest pain, she does get rare atypical right-sided chest pain.  She continues to wear home oxygen.  No orthopnea/PND.  No lightheadedness.  BP controlled today.    Labs (5/22): Pro-BNP 2347, K 4.1, creatinine 1.46 Labs (6/22): K 3.9,  creatinine 1.75  PMH: 1. Hyperlipidemia 2. HTN 3. CAD: CABG 2008 with LIMA-LAD, SVG-ramus, SVG-OM, SVG-PDA.  - Cardiolite (11/21): Small reversible inferolateral defect.  - LHC (6/22): LIMA-LAD and SVG-PDA were patent, sequential SVG-OM and ramus only touched down on OM.  Native RCA has a heavily calcified ostium with 95% stenosis, very difficult to seat the catheter due to calcification.  The native PDA is occluded at the ostium, and SVG-PDA only fills the PDA (does not back-fill to PLV and RCA).  Therefore, PLV territory is likely jeopardized by the ostial RCA stenosis. I reviewed the films with Dr. [place].  Potentially could intervene on ostial RCA using atherectomy (would be difficult).  As patient's primary symptoms are CHF-related and this is improved with diuresis, will manage medically now with option for PCI if she develops chest pain, etc. 4. Chronic diastolic CHF: Echo (3/22) with EF 60-65%, normal RV.  - Multiple admission with CHF.  - RHC (6/22): mean RA 10, PA 42/15 mean 25, mean PCWP 13, CI 3.72, PVR 1.7 WU.  5. CKD: Stage 3.   SH: Lives in [place], retired from [employer], lives alone, has a son in [place].  Quit smoking in 2003, no ETOH.   Family History  Problem Relation Age of Onset   Cancer Other        Family Hx of Cancer   Coronary artery disease Other        Family Hx of CAD   ROS: All  systems reviewed and negative except as per HPI.   Current Outpatient Medications  Medication Sig Dispense Refill   acetaminophen (TYLENOL) 500 MG tablet Take 500-1,000 mg by mouth every 6 (six) hours as needed (pain).     ALPRAZolam (XANAX) 0.5 MG tablet Take 0.5 mg by mouth at bedtime as needed for anxiety or sleep.     aspirin EC 81 MG tablet Take 81 mg by mouth every evening. Swallow whole.     atorvastatin (LIPITOR) 40 MG tablet Take 40 mg by mouth every evening.  0   Cholecalciferol (VITAMIN D) 125 MCG (5000 UT) CAPS Take 5,000 Units by mouth daily with  lunch.     empagliflozin (JARDIANCE) 10 MG TABS tablet Take 1 tablet (10 mg total) by mouth daily before breakfast. 30 tablet 11   HUMALOG 100 UNIT/ML injection Inject into the skin with breakfast, with lunch, and with evening meal. With insulin pump     hydrALAZINE (APRESOLINE) 50 MG tablet Take 1.5 tablets (75 mg total) by mouth 3 (three) times daily. 405 tablet 3   Insulin Disposable Pump (OMNIPOD DASH PODS, GEN 4,) MISC Take by mouth every 3 (three) days.     Insulin Pen Needle (COMFORT TOUCH INSULIN PEN NEED) 31G X 5 MM MISC 1 each by Does not apply route in the morning, at noon, in the evening, and at bedtime. (Patient taking differently: by Does not apply route in the morning, at noon, in the evening, and at bedtime.) 100 each 0   ipratropium (ATROVENT) 0.03 % nasal spray Place 1 spray into both nostrils 4 (four) times daily as needed (allergies).     isosorbide dinitrate (ISORDIL) 10 MG tablet Take 10 mg by mouth 3 (three) times daily.     lisinopril (ZESTRIL) 40 MG tablet TAKE 1/2 TABLET DAILY (Patient taking differently: Take by mouth in the morning.) 45 tablet 1   loratadine (CLARITIN) 10 MG tablet Take 10 mg by mouth daily as needed for allergies.     metoprolol tartrate (LOPRESSOR) 25 MG tablet Take 1/2 (one-half) tablet by mouth twice daily (Patient taking differently: Take 12.5 mg by mouth 2 (two) times daily.) 90 tablet 1   nitroGLYCERIN (NITROSTAT) 0.4 MG SL tablet DISSOLVE ONE TABLET UNDER THE TONGUE EVERY 5 MINUTES AS NEEDED FOR CHEST PAIN.  DO NOT EXCEED A TOTAL OF 3 DOSES IN 15 MINUTES 25 tablet 3   Omega-3 Fatty Acids (OMEGA-3 FISH OIL PO) Take 3 tablets by mouth in the morning.     omeprazole (PRILOSEC) 20 MG capsule Take 20 mg by mouth in the morning.     ondansetron (ZOFRAN) 4 MG tablet Take 4 mg by mouth every 6 (six) hours as needed for nausea.     OXYGEN Inhale into the lungs. As needed     promethazine (PHENERGAN) 25 MG tablet Take 12.5 mg by mouth every 6 (six) hours as  needed for vomiting or nausea.     traMADol (ULTRAM) 50 MG tablet Take 50 mg by mouth 3 (three) times daily as needed (arthritis pain).     vitamin C (ASCORBIC ACID) 500 MG tablet Take 500 mg by mouth in the morning.     torsemide (DEMADEX) 20 MG tablet Take 3 tablets (60 mg total) by mouth 2 (two) times daily for 3 days, THEN 3 tablets (60 mg total) every morning for 30 days, THEN 2 tablets (40 mg total) daily at 2 PM. 168 tablet 0   No current facility-administered medications for this encounter.  BP (!) 128/48   Pulse 70   Wt 90.6 kg (199 lb 12.8 oz)   SpO2 91% Comment: 2L  BMI 39.02 kg/m  General: NAD Neck: JVP 9-10 cm, no thyromegaly or thyroid nodule.  Lungs: Clear to auscultation bilaterally with normal respiratory effort. CV: Nondisplaced PMI.  Heart regular S1/S2, no S3/S4, no murmur.  1+ edema 1/2 to knees bilaterally.  No carotid bruit.  Normal pedal pulses.  Abdomen: Soft, nontender, no hepatosplenomegaly, no distention.  Skin: Intact without lesions or rashes.  Neurologic: Alert and oriented x 3.  Psych: Normal affect. Extremities: No clubbing or cyanosis.  HEENT: Normal.   Assessment/Plan: 1. Chronic diastolic CHF: Echo in 3/22 with EF 60-65%, normal RV.  She has had 3 admissions in the last year with CHF.  She does not have LVH on echo so suspect cardiac amyloidosis is unlikely.  NYHA class III symptoms, still volume overloaded.  Volume management is complicated by CKD stage 3.   - Increase torsemide to 60 mg bid x 3 days then 60 qam/40 qpm after that.  BMET/BNP today and BMET in 10 days.  - Continue Jardiance 10 mg daily.  - She would be a good candidate for Cardiomems, will work on this with her insurance.  2. HTN: BP controlled today.  - Continue hydralazine 75 mg tid.   - If creatinine rises any further, will stop lisinopril and increase hydralazine/add amlodipine.   3. CAD: S/p CABG in 2008.  HS-TnI up to 1777 during 3/22 admission but no cath due to AKI.  She has  prominent exertional dyspnea but only atypical chest pain.  LHC in 6/22 showed that LIMA-LAD and SVG-PDA were patent, sequential SVG-OM and ramus only touched down on OM.  Native RCA has a heavily calcified ostium with 95% stenosis, very difficult to seat the catheter due to calcification.  The native PDA is occluded at the ostium, and SVG-PDA only fills the PDA (does not back-fill to PLV and RCA).  Therefore, PLV territory is likely jeopardized by the ostial RCA stenosis. I reviewed the films with Dr. [place].  Potentially could intervene on ostial RCA using atherectomy (would be difficult).  As patient's primary symptoms are CHF-related and this is improved with diuresis, will manage medically now with option for PCI if she develops chest pain, etc..  - Continue ASA 81 daily.  - Continue atorvastatin 40 mg daily.  - Continue metoprolol 12.5 bid.  4. Suspect OSA: She says that she would not wear CPAP, so sleep study not ordered.  5. CKD stage 3: Last creatinine 1.75.  BMET today.   Followup in 1 month with APP.   Marca Ancona 05/12/2021

## 2021-05-13 ENCOUNTER — Telehealth (HOSPITAL_COMMUNITY): Payer: Self-pay | Admitting: Pharmacy Technician

## 2021-05-13 ENCOUNTER — Other Ambulatory Visit (HOSPITAL_COMMUNITY): Payer: Self-pay

## 2021-05-13 ENCOUNTER — Other Ambulatory Visit (HOSPITAL_COMMUNITY): Payer: Self-pay | Admitting: Surgery

## 2021-05-13 DIAGNOSIS — I5032 Chronic diastolic (congestive) heart failure: Secondary | ICD-10-CM

## 2021-05-13 MED ORDER — HYDRALAZINE HCL 50 MG PO TABS: 100.0000 mg | ORAL_TABLET | Freq: Three times a day (TID) | ORAL | 3 refills | Status: DC

## 2021-05-13 NOTE — Telephone Encounter (Signed)
Advanced Heart Failure Patient Advocate Encounter  The patient is currently in the donut hole causing her co-pay of Jardiance to be unaffordable. Was able to get her a $1000 grant from [employer], as the HF fund has been opened.  Member ID: [id number] Group ID: [id number] RxBin ID: [id number] PCN: [id number] Eligibility Start Date: 02/12/2021 Eligibility End Date: 05/12/2022 Assistance Amount: $1,000.00  Sent Jasmine, (CMA) request to send in a 90 day RX of Jardiance to the patient's pharmacy.  Archer Asa, CPhT

## 2021-05-13 NOTE — Progress Notes (Signed)
Orders entered per Dr. Shirlee Latch for Bmet next week, discontinue Lisinopril and increase Hydralazine to 100 TID.  Patient aware and agreeable.  Patient also concerned about the Jardiance and the ongoing cost as she needs refill soon.  I will send a message to AHF Patient Advocate Zachery Dauer.

## 2021-05-16 ENCOUNTER — Ambulatory Visit: Payer: Medicare Other | Admitting: Cardiology

## 2021-05-16 ENCOUNTER — Other Ambulatory Visit (HOSPITAL_COMMUNITY): Payer: Self-pay | Admitting: *Deleted

## 2021-05-16 MED ORDER — EMPAGLIFLOZIN 10 MG PO TABS: 10.0000 mg | ORAL_TABLET | Freq: Every day | ORAL | 3 refills | Status: DC

## 2021-05-23 DIAGNOSIS — I509 Heart failure, unspecified: Secondary | ICD-10-CM | POA: Diagnosis not present

## 2021-05-28 DIAGNOSIS — I10 Essential (primary) hypertension: Secondary | ICD-10-CM | POA: Diagnosis not present

## 2021-05-28 DIAGNOSIS — R519 Headache, unspecified: Secondary | ICD-10-CM | POA: Diagnosis not present

## 2021-05-28 DIAGNOSIS — R778 Other specified abnormalities of plasma proteins: Secondary | ICD-10-CM | POA: Diagnosis not present

## 2021-05-28 DIAGNOSIS — E119 Type 2 diabetes mellitus without complications: Secondary | ICD-10-CM | POA: Diagnosis not present

## 2021-06-03 DIAGNOSIS — R778 Other specified abnormalities of plasma proteins: Secondary | ICD-10-CM | POA: Diagnosis not present

## 2021-06-03 DIAGNOSIS — I10 Essential (primary) hypertension: Secondary | ICD-10-CM | POA: Diagnosis not present

## 2021-06-03 DIAGNOSIS — E119 Type 2 diabetes mellitus without complications: Secondary | ICD-10-CM | POA: Diagnosis not present

## 2021-06-03 DIAGNOSIS — R519 Headache, unspecified: Secondary | ICD-10-CM | POA: Diagnosis not present

## 2021-06-06 DIAGNOSIS — Z1231 Encounter for screening mammogram for malignant neoplasm of breast: Secondary | ICD-10-CM | POA: Diagnosis not present

## 2021-06-07 ENCOUNTER — Ambulatory Visit: Payer: Medicare Other | Admitting: Nutrition

## 2021-06-14 NOTE — Progress Notes (Signed)
PCP: Toma Deiters, MD Cardiology: Dr. Wyline Mood HF Cardiology: Dr. Shirlee Latch  75 y.o. with history of CAD, diastolic CHF, and poorly controlled HTN was referred by Dr. Wyline Mood for CHF evaluation. Patient had CABG in 2008.  She has poorly controlled HTN and CKD stage 3.  She was admitted in 10/21 with hypertensive urgency and CHF due to medication noncompliance.  She was admitted again in 3/22 with acute diastolic CHF, AKI to 2.9. HS-TnI was 1777 but cath was not done due to AKI.  Echo showed EF 60-65% with normal RV.  She was again admitted (this time to [facility]) in 5/22 with CHF.  No med changes were made.  Given multiple admission with CHF and difficulty managing volume, she was referred to CHF clinic.  Torsemide was increased and Jardiance was started.   LHC/RHC in 6/22 showed mildly elevated RA pressure, normal PCWP.  LIMA-LAD and SVG-PDA were patent, sequential SVG-OM and ramus only touched down on OM.  Native RCA has a heavily calcified ostium with 95% stenosis, very difficult to seat the catheter due to calcification.  The native PDA is occluded at the ostium, and SVG-PDA only fills the PDA (does not back-fill to PLV and RCA).  Therefore, PLV territory is likely jeopardized by the ostial RCA stenosis. I reviewed the films with Dr. [place].  Potentially could intervene on ostial RCA using atherectomy (would be difficult).  As patient's primary symptoms are CHF-related and this is improved with diuresis, will manage medically now with option for PCI if she develops chest pain, etc.  Patient returned 6/22 for followup of CHF.  She feels better on higher torsemide dose.  Still short of breath after walking about 100 feet.  No exertional chest pain, she does get rare atypical right-sided chest pain.  She continues to wear home oxygen.   Her torsemide was increased for 3 days.  Today she returns for HF follow up. Overall feeling fine. Denies increasing SOB, CP, dizziness, or PND/Orthopnea. Appetite ok.  No fever or chills. Weight at home 198 pounds. Taking all medications. Now on torsemide 60 bid for the past 3 days as she is started to put weight back on. She feels best at 188 lbs. BP at home 130s/60s. She is on 2L oxygen continuously.  Labs (5/22): Pro-BNP 2347, K 4.1, creatinine 1.46 Labs (6/22): K 3.9, creatinine 1.75 Labs   PMH: 1. Hyperlipidemia 2. HTN 3. CAD: CABG 2008 with LIMA-LAD, SVG-ramus, SVG-OM, SVG-PDA.  - Cardiolite (11/21): Small reversible inferolateral defect.  - LHC (6/22): LIMA-LAD and SVG-PDA were patent, sequential SVG-OM and ramus only touched down on OM.  Native RCA has a heavily calcified ostium with 95% stenosis, very difficult to seat the catheter due to calcification.  The native PDA is occluded at the ostium, and SVG-PDA only fills the PDA (does not back-fill to PLV and RCA).  Therefore, PLV territory is likely jeopardized by the ostial RCA stenosis. I reviewed the films with Dr. [place].  Potentially could intervene on ostial RCA using atherectomy (would be difficult).  As patient's primary symptoms are CHF-related and this is improved with diuresis, will manage medically now with option for PCI if she develops chest pain, etc. 4. Chronic diastolic CHF: Echo (3/22) with EF 60-65%, normal RV.  - Multiple admission with CHF.  - RHC (6/22): mean RA 10, PA 42/15 mean 25, mean PCWP 13, CI 3.72, PVR 1.7 WU.  5. CKD: Stage 3.   SH: Lives in [place], retired from [employer]  De[employer], lives alone, has a son in [place].  Quit smoking in 2003, no ETOH.   Family History  Problem Relation Age of Onset   Cancer Other        Family Hx of Cancer   Coronary artery disease Other        Family Hx of CAD   ROS: All systems reviewed and negative except as per HPI.   Current Outpatient Medications  Medication Sig Dispense Refill   acetaminophen (TYLENOL) 500 MG tablet Take 500-1,000 mg by mouth every 6 (six) hours as needed (pain).     ALPRAZolam (XANAX) 0.5 MG  tablet Take 0.5 mg by mouth at bedtime as needed for anxiety or sleep.     aspirin EC 81 MG tablet Take 81 mg by mouth every evening. Swallow whole.     atorvastatin (LIPITOR) 40 MG tablet Take 40 mg by mouth every evening.  0   Cholecalciferol (VITAMIN D) 125 MCG (5000 UT) CAPS Take 5,000 Units by mouth daily with lunch.     empagliflozin (JARDIANCE) 10 MG TABS tablet Take 1 tablet (10 mg total) by mouth daily before breakfast. 90 tablet 3   HUMALOG 100 UNIT/ML injection Inject into the skin with breakfast, with lunch, and with evening meal. With insulin pump     hydrALAZINE (APRESOLINE) 50 MG tablet Take 2 tablets (100 mg total) by mouth 3 (three) times daily. 405 tablet 3   Insulin Disposable Pump (OMNIPOD DASH PODS, GEN 4,) MISC Take by mouth every 3 (three) days.     Insulin Pen Needle (COMFORT TOUCH INSULIN PEN NEED) 31G X 5 MM MISC 1 each by Does not apply route in the morning, at noon, in the evening, and at bedtime. 100 each 0   ipratropium (ATROVENT) 0.03 % nasal spray Place 1 spray into both nostrils 4 (four) times daily as needed (allergies).     isosorbide dinitrate (ISORDIL) 10 MG tablet Take 10 mg by mouth 3 (three) times daily.     loratadine (CLARITIN) 10 MG tablet Take 10 mg by mouth daily as needed for allergies.     metoprolol tartrate (LOPRESSOR) 25 MG tablet Take 1/2 (one-half) tablet by mouth twice daily 90 tablet 1   nitroGLYCERIN (NITROSTAT) 0.4 MG SL tablet DISSOLVE ONE TABLET UNDER THE TONGUE EVERY 5 MINUTES AS NEEDED FOR CHEST PAIN.  DO NOT EXCEED A TOTAL OF 3 DOSES IN 15 MINUTES 25 tablet 3   Omega-3 Fatty Acids (OMEGA-3 FISH OIL PO) Take 3 tablets by mouth in the morning.     omeprazole (PRILOSEC) 20 MG capsule Take 20 mg by mouth in the morning.     ondansetron (ZOFRAN) 4 MG tablet Take 4 mg by mouth every 6 (six) hours as needed for nausea.     OXYGEN Inhale into the lungs. As needed     promethazine (PHENERGAN) 25 MG tablet Take 12.5 mg by mouth every 6 (six) hours  as needed for vomiting or nausea.     traMADol (ULTRAM) 50 MG tablet Take 50 mg by mouth 3 (three) times daily as needed (arthritis pain).     vitamin C (ASCORBIC ACID) 500 MG tablet Take 500 mg by mouth in the morning.     torsemide (DEMADEX) 20 MG tablet Take 3 tablets (60 mg total) by mouth 2 (two) times daily. 180 tablet 3   No current facility-administered medications for this encounter.   BP (!) 174/68   Pulse (!) 58   Wt 88.6 kg (195 lb  6.4 oz)   SpO2 97%   BMI 38.16 kg/m   Wt Readings from Last 3 Encounters:  06/15/21 88.6 kg (195 lb 6.4 oz)  05/12/21 90.6 kg (199 lb 12.8 oz)  04/28/21 89.4 kg (197 lb)   General:  NAD. No resp difficulty, on oxygen. HEENT: Normal Neck: Supple. JVP 6-7. Carotids 2+ bilat; no bruits. No lymphadenopathy or thryomegaly appreciated. Cor: PMI nondisplaced. Regular rate & rhythm. No rubs, gallops or murmurs. Lungs: Clear Abdomen: Obese,  nontender, nondistended. No hepatosplenomegaly. No bruits or masses. Good bowel sounds. Extremities: No cyanosis, clubbing, rash, 1+ BLE edema, L>R Neuro: Alert & oriented x 3, cranial nerves grossly intact. Moves all 4 extremities w/o difficulty. Affect pleasant.  Assessment/Plan: 1. Chronic diastolic CHF: Echo in 3/22 with EF 60-65%, normal RV.  She has had 3 admissions in the last year with CHF.  She does not have LVH on echo so suspect cardiac amyloidosis is unlikely.  NYHA class III symptoms, still volume overloaded, ReDs clip 42%.  Volume management is complicated by CKD stage 3.   - Increase torsemide to 80 mg bid x 3 days then 60 mg bid.  BMET/BNP today and BMET in 10 days.  - Continue Jardiance 10 mg daily.  - She would be a good candidate for Cardiomems, will work on this with her insurance.  2. HTN: Elevated today however she received distressing news of the death of her sister in law prior to her appointment. BP at home ~130/60. - Continue hydralazine 100 mg tid.   - Will hold off on increasing  hydralazine/add amlodipine today.   3. CAD: S/p CABG in 2008.  HS-TnI up to 1777 during 3/22 admission but no cath due to AKI.  She has prominent exertional dyspnea but only atypical chest pain.  LHC in 6/22 showed that LIMA-LAD and SVG-PDA were patent, sequential SVG-OM and ramus only touched down on OM.  Native RCA has a heavily calcified ostium with 95% stenosis, very difficult to seat the catheter due to calcification.  The native PDA is occluded at the ostium, and SVG-PDA only fills the PDA (does not back-fill to PLV and RCA).  Therefore, PLV territory is likely jeopardized by the ostial RCA stenosis. I reviewed the films with Dr. [place].  Potentially could intervene on ostial RCA using atherectomy (would be difficult).  As patient's primary symptoms are CHF-related and this is improved with diuresis, will manage medically now with option for PCI if she develops chest pain, etc.  - Continue ASA 81 daily.  - Continue atorvastatin 40 mg daily.  - Continue metoprolol 12.5 bid.  4. Suspect OSA: She says that she would not wear CPAP, so sleep study not ordered.  5. CKD stage 3: Last creatinine 1.75.  BMET today.   Followup in 3-4 weeks with APP.   [facility] 06/15/2021

## 2021-06-14 NOTE — H&P (View-Only) (Signed)
PCP: Toma Deiters, MD Cardiology: Dr. Wyline Mood HF Cardiology: Dr. Shirlee Latch  75 y.o. with history of CAD, diastolic CHF, and poorly controlled HTN was referred by Dr. Wyline Mood for CHF evaluation. Patient had CABG in 2008.  She has poorly controlled HTN and CKD stage 3.  She was admitted in 10/21 with hypertensive urgency and CHF due to medication noncompliance.  She was admitted again in 3/22 with acute diastolic CHF, AKI to 2.9. HS-TnI was 1777 but cath was not done due to AKI.  Echo showed EF 60-65% with normal RV.  She was again admitted (this time to [facility]) in 5/22 with CHF.  No med changes were made.  Given multiple admission with CHF and difficulty managing volume, she was referred to CHF clinic.  Torsemide was increased and Jardiance was started.   LHC/RHC in 6/22 showed mildly elevated RA pressure, normal PCWP.  LIMA-LAD and SVG-PDA were patent, sequential SVG-OM and ramus only touched down on OM.  Native RCA has a heavily calcified ostium with 95% stenosis, very difficult to seat the catheter due to calcification.  The native PDA is occluded at the ostium, and SVG-PDA only fills the PDA (does not back-fill to PLV and RCA).  Therefore, PLV territory is likely jeopardized by the ostial RCA stenosis. I reviewed the films with Dr. [place].  Potentially could intervene on ostial RCA using atherectomy (would be difficult).  As patient's primary symptoms are CHF-related and this is improved with diuresis, will manage medically now with option for PCI if she develops chest pain, etc.  Patient returned 6/22 for followup of CHF.  She feels better on higher torsemide dose.  Still short of breath after walking about 100 feet.  No exertional chest pain, she does get rare atypical right-sided chest pain.  She continues to wear home oxygen.   Her torsemide was increased for 3 days.  Today she returns for HF follow up. Overall feeling fine. Denies increasing SOB, CP, dizziness, or PND/Orthopnea. Appetite ok.  No fever or chills. Weight at home 198 pounds. Taking all medications. Now on torsemide 60 bid for the past 3 days as she is started to put weight back on. She feels best at 188 lbs. BP at home 130s/60s. She is on 2L oxygen continuously.  Labs (5/22): Pro-BNP 2347, K 4.1, creatinine 1.46 Labs (6/22): K 3.9, creatinine 1.75 Labs   PMH: 1. Hyperlipidemia 2. HTN 3. CAD: CABG 2008 with LIMA-LAD, SVG-ramus, SVG-OM, SVG-PDA.  - Cardiolite (11/21): Small reversible inferolateral defect.  - LHC (6/22): LIMA-LAD and SVG-PDA were patent, sequential SVG-OM and ramus only touched down on OM.  Native RCA has a heavily calcified ostium with 95% stenosis, very difficult to seat the catheter due to calcification.  The native PDA is occluded at the ostium, and SVG-PDA only fills the PDA (does not back-fill to PLV and RCA).  Therefore, PLV territory is likely jeopardized by the ostial RCA stenosis. I reviewed the films with Dr. [place].  Potentially could intervene on ostial RCA using atherectomy (would be difficult).  As patient's primary symptoms are CHF-related and this is improved with diuresis, will manage medically now with option for PCI if she develops chest pain, etc. 4. Chronic diastolic CHF: Echo (3/22) with EF 60-65%, normal RV.  - Multiple admission with CHF.  - RHC (6/22): mean RA 10, PA 42/15 mean 25, mean PCWP 13, CI 3.72, PVR 1.7 WU.  5. CKD: Stage 3.   SH: Lives in [place], retired from [employer]  Dept, lives alone, has a son in [place].  Quit smoking in 2003, no ETOH.   Family History  Problem Relation Age of Onset   Cancer Other        Family Hx of Cancer   Coronary artery disease Other        Family Hx of CAD   ROS: All systems reviewed and negative except as per HPI.   Current Outpatient Medications  Medication Sig Dispense Refill   acetaminophen (TYLENOL) 500 MG tablet Take 500-1,000 mg by mouth every 6 (six) hours as needed (pain).     ALPRAZolam (XANAX) 0.5 MG  tablet Take 0.5 mg by mouth at bedtime as needed for anxiety or sleep.     aspirin EC 81 MG tablet Take 81 mg by mouth every evening. Swallow whole.     atorvastatin (LIPITOR) 40 MG tablet Take 40 mg by mouth every evening.  0   Cholecalciferol (VITAMIN D) 125 MCG (5000 UT) CAPS Take 5,000 Units by mouth daily with lunch.     empagliflozin (JARDIANCE) 10 MG TABS tablet Take 1 tablet (10 mg total) by mouth daily before breakfast. 90 tablet 3   HUMALOG 100 UNIT/ML injection Inject into the skin with breakfast, with lunch, and with evening meal. With insulin pump     hydrALAZINE (APRESOLINE) 50 MG tablet Take 2 tablets (100 mg total) by mouth 3 (three) times daily. 405 tablet 3   Insulin Disposable Pump (OMNIPOD DASH PODS, GEN 4,) MISC Take by mouth every 3 (three) days.     Insulin Pen Needle (COMFORT TOUCH INSULIN PEN NEED) 31G X 5 MM MISC 1 each by Does not apply route in the morning, at noon, in the evening, and at bedtime. 100 each 0   ipratropium (ATROVENT) 0.03 % nasal spray Place 1 spray into both nostrils 4 (four) times daily as needed (allergies).     isosorbide dinitrate (ISORDIL) 10 MG tablet Take 10 mg by mouth 3 (three) times daily.     loratadine (CLARITIN) 10 MG tablet Take 10 mg by mouth daily as needed for allergies.     metoprolol tartrate (LOPRESSOR) 25 MG tablet Take 1/2 (one-half) tablet by mouth twice daily 90 tablet 1   nitroGLYCERIN (NITROSTAT) 0.4 MG SL tablet DISSOLVE ONE TABLET UNDER THE TONGUE EVERY 5 MINUTES AS NEEDED FOR CHEST PAIN.  DO NOT EXCEED A TOTAL OF 3 DOSES IN 15 MINUTES 25 tablet 3   Omega-3 Fatty Acids (OMEGA-3 FISH OIL PO) Take 3 tablets by mouth in the morning.     omeprazole (PRILOSEC) 20 MG capsule Take 20 mg by mouth in the morning.     ondansetron (ZOFRAN) 4 MG tablet Take 4 mg by mouth every 6 (six) hours as needed for nausea.     OXYGEN Inhale into the lungs. As needed     promethazine (PHENERGAN) 25 MG tablet Take 12.5 mg by mouth every 6 (six) hours  as needed for vomiting or nausea.     traMADol (ULTRAM) 50 MG tablet Take 50 mg by mouth 3 (three) times daily as needed (arthritis pain).     vitamin C (ASCORBIC ACID) 500 MG tablet Take 500 mg by mouth in the morning.     torsemide (DEMADEX) 20 MG tablet Take 3 tablets (60 mg total) by mouth 2 (two) times daily. 180 tablet 3   No current facility-administered medications for this encounter.   BP (!) 174/68   Pulse (!) 58   Wt 88.6 kg (195 lb  6.4 oz)   SpO2 97%   BMI 38.16 kg/m   Wt Readings from Last 3 Encounters:  06/15/21 88.6 kg (195 lb 6.4 oz)  05/12/21 90.6 kg (199 lb 12.8 oz)  04/28/21 89.4 kg (197 lb)   General:  NAD. No resp difficulty, on oxygen. HEENT: Normal Neck: Supple. JVP 6-7. Carotids 2+ bilat; no bruits. No lymphadenopathy or thryomegaly appreciated. Cor: PMI nondisplaced. Regular rate & rhythm. No rubs, gallops or murmurs. Lungs: Clear Abdomen: Obese,  nontender, nondistended. No hepatosplenomegaly. No bruits or masses. Good bowel sounds. Extremities: No cyanosis, clubbing, rash, 1+ BLE edema, L>R Neuro: Alert & oriented x 3, cranial nerves grossly intact. Moves all 4 extremities w/o difficulty. Affect pleasant.  Assessment/Plan: 1. Chronic diastolic CHF: Echo in 3/22 with EF 60-65%, normal RV.  She has had 3 admissions in the last year with CHF.  She does not have LVH on echo so suspect cardiac amyloidosis is unlikely.  NYHA class III symptoms, still volume overloaded, ReDs clip 42%.  Volume management is complicated by CKD stage 3.   - Increase torsemide to 80 mg bid x 3 days then 60 mg bid.  BMET/BNP today and BMET in 10 days.  - Continue Jardiance 10 mg daily.  - She would be a good candidate for Cardiomems, will work on this with her insurance.  2. HTN: Elevated today however she received distressing news of the death of her sister in law prior to her appointment. BP at home ~130/60. - Continue hydralazine 100 mg tid.   - Will hold off on increasing  hydralazine/add amlodipine today.   3. CAD: S/p CABG in 2008.  HS-TnI up to 1777 during 3/22 admission but no cath due to AKI.  She has prominent exertional dyspnea but only atypical chest pain.  LHC in 6/22 showed that LIMA-LAD and SVG-PDA were patent, sequential SVG-OM and ramus only touched down on OM.  Native RCA has a heavily calcified ostium with 95% stenosis, very difficult to seat the catheter due to calcification.  The native PDA is occluded at the ostium, and SVG-PDA only fills the PDA (does not back-fill to PLV and RCA).  Therefore, PLV territory is likely jeopardized by the ostial RCA stenosis. I reviewed the films with Dr. [place].  Potentially could intervene on ostial RCA using atherectomy (would be difficult).  As patient's primary symptoms are CHF-related and this is improved with diuresis, will manage medically now with option for PCI if she develops chest pain, etc.  - Continue ASA 81 daily.  - Continue atorvastatin 40 mg daily.  - Continue metoprolol 12.5 bid.  4. Suspect OSA: She says that she would not wear CPAP, so sleep study not ordered.  5. CKD stage 3: Last creatinine 1.75.  BMET today.   Followup in 3-4 weeks with APP.   [facility] 06/15/2021

## 2021-06-15 ENCOUNTER — Encounter (HOSPITAL_COMMUNITY): Payer: Self-pay

## 2021-06-15 ENCOUNTER — Other Ambulatory Visit: Payer: Self-pay

## 2021-06-15 ENCOUNTER — Ambulatory Visit (HOSPITAL_COMMUNITY)
Admission: RE | Admit: 2021-06-15 | Discharge: 2021-06-15 | Disposition: A | Discharge status: Home / Self Care | Payer: Medicare Other | Source: Ambulatory Visit | Attending: Family Medicine | Admitting: Family Medicine

## 2021-06-15 VITALS — BP 174/68 | HR 58 | Wt 195.4 lb

## 2021-06-15 DIAGNOSIS — I5032 Chronic diastolic (congestive) heart failure: Secondary | ICD-10-CM

## 2021-06-15 DIAGNOSIS — I10 Essential (primary) hypertension: Secondary | ICD-10-CM

## 2021-06-15 DIAGNOSIS — Z79899 Other long term (current) drug therapy: Secondary | ICD-10-CM | POA: Insufficient documentation

## 2021-06-15 DIAGNOSIS — I2510 Atherosclerotic heart disease of native coronary artery without angina pectoris: Secondary | ICD-10-CM

## 2021-06-15 DIAGNOSIS — I130 Hypertensive heart and chronic kidney disease with heart failure and stage 1 through stage 4 chronic kidney disease, or unspecified chronic kidney disease: Secondary | ICD-10-CM | POA: Diagnosis not present

## 2021-06-15 DIAGNOSIS — N183 Chronic kidney disease, stage 3 unspecified: Secondary | ICD-10-CM | POA: Insufficient documentation

## 2021-06-15 DIAGNOSIS — Z794 Long term (current) use of insulin: Secondary | ICD-10-CM | POA: Diagnosis not present

## 2021-06-15 DIAGNOSIS — Z7982 Long term (current) use of aspirin: Secondary | ICD-10-CM | POA: Insufficient documentation

## 2021-06-15 DIAGNOSIS — Z8249 Family history of ischemic heart disease and other diseases of the circulatory system: Secondary | ICD-10-CM | POA: Diagnosis not present

## 2021-06-15 DIAGNOSIS — Z87891 Personal history of nicotine dependence: Secondary | ICD-10-CM | POA: Insufficient documentation

## 2021-06-15 DIAGNOSIS — Z951 Presence of aortocoronary bypass graft: Secondary | ICD-10-CM | POA: Insufficient documentation

## 2021-06-15 DIAGNOSIS — Z9981 Dependence on supplemental oxygen: Secondary | ICD-10-CM | POA: Insufficient documentation

## 2021-06-15 DIAGNOSIS — Z9641 Presence of insulin pump (external) (internal): Secondary | ICD-10-CM | POA: Insufficient documentation

## 2021-06-15 LAB — BASIC METABOLIC PANEL
Anion gap: 10 (ref 5–15)
BUN: 26 mg/dL — ABNORMAL HIGH (ref 8–23)
CO2: 34 mmol/L — ABNORMAL HIGH (ref 22–32)
Calcium: 9.2 mg/dL (ref 8.9–10.3)
Chloride: 94 mmol/L — ABNORMAL LOW (ref 98–111)
Creatinine, Ser: 1.62 mg/dL — ABNORMAL HIGH (ref 0.44–1.00)
GFR, Estimated: 34 mL/min — ABNORMAL LOW (ref >60)
Glucose, Bld: 81 mg/dL (ref 70–99)
Potassium: 3.5 mmol/L (ref 3.5–5.1)
Sodium: 138 mmol/L (ref 135–145)

## 2021-06-15 LAB — BRAIN NATRIURETIC PEPTIDE: B Natriuretic Peptide: 268.6 pg/mL — ABNORMAL HIGH (ref 0.0–100.0)

## 2021-06-15 MED ORDER — TORSEMIDE 20 MG PO TABS: 60.0000 mg | ORAL_TABLET | Freq: Two times a day (BID) | ORAL | 3 refills | Status: DC

## 2021-06-15 NOTE — Patient Instructions (Signed)
INCREASE Torsemide to 80 mg twice a day for 3 days, then decrease to 60 mg twice a day  Labs today We will only contact you if something comes back abnormal or we need to make some changes. Otherwise no news is good news!  Labs needed in 7-10 days  Your physician recommends that you schedule a follow-up appointment in: 3 weeks  in the Advanced Practitioners (PA/NP) Clinic    Do the following things EVERYDAY: Weigh yourself in the morning before breakfast. Write it down and keep it in a log. Take your medicines as prescribed Eat low salt foods--Limit salt (sodium) to 2000 mg per day.  Stay as active as you can everyday Limit all fluids for the day to less than 2 liters  milAt the [facility], you and your health needs are our priority. As part of our continuing mission to provide you with exceptional heart care, we have created designated Provider Care Teams. These Care Teams include your primary Cardiologist (physician) and Advanced Practice Providers (APPs- Physician Assistants and Nurse Practitioners) who all work together to provide you with the care you need, when you need it.   You may see any of the following providers on your designated Care Team at your next follow up: Dr Arvilla Meres Dr Marca Ancona Dr Brandon Melnick, NP Robbie Lis, [place] Mikki Santee Karle Plumber, PharmD   Please be sure to bring in all your medications bottles to every appointment.   If you have any questions or concerns before your next appointment please send [place] a message through [place] or call our office at [phone].    TO LEAVE A MESSAGE FOR THE NURSE SELECT OPTION 2, PLEASE LEAVE A MESSAGE INCLUDING: YOUR NAME DATE OF BIRTH CALL BACK NUMBER REASON FOR CALL**this is important as we prioritize the call backs  YOU WILL RECEIVE A CALL BACK THE SAME DAY AS LONG AS YOU CALL BEFORE 4:00 PM

## 2021-06-15 NOTE — Progress Notes (Signed)
ReDS Vest / Clip - 06/15/21 1400       ReDS Vest / Clip   Station Marker A    Ruler Value 26    ReDS Value Range High volume overload    ReDS Actual Value 42

## 2021-06-16 ENCOUNTER — Telehealth (HOSPITAL_COMMUNITY): Payer: Self-pay | Admitting: Cardiology

## 2021-06-16 MED ORDER — POTASSIUM CHLORIDE CRYS ER 20 MEQ PO TBCR: 20.0000 meq | EXTENDED_RELEASE_TABLET | Freq: Every day | ORAL | 3 refills | Status: DC

## 2021-06-16 NOTE — Telephone Encounter (Signed)
Pt aware of mems instructions and lab results Will have labs done as previously scheduled in [place], [place]

## 2021-06-16 NOTE — Telephone Encounter (Signed)
Cardio MEMs approved Implant scheduled 06/23/21 @ 130       You are scheduled for a Cardiac Catheterization CardioMems Implant on Thursday, July 28 with Dr. Marca Ancona.  1. Please arrive at the [facility] (Main Entrance A) at [facility]: [address] [place], [place] [address] at 11:30 AM (This time is two hours before your procedure to ensure your preparation). Free valet parking service is available.   Special note: Every effort is made to have your procedure done on time. Please understand that emergencies sometimes delay scheduled procedures.  2. Diet: Do not eat solid foods after midnight.  The patient may have clear liquids until 5am upon the day of the procedure.  3. Labs: You will need to have blood drawn on Tuesday, July 26 at 2:45p in the [facility]. You do not need to be fasting.  4. Medication instructions in preparation for your procedure:   Contrast Allergy: No     Stop taking, Torsemide (Demadex) Thursday, July 28,  Take only half of your evening dose units of insulin the night before your procedure. Do not take any insulin on the day of the procedure.    On the morning of your procedure, take your Aspirin and any morning medicines NOT listed above.  You may use sips of water.  5. Plan for one night stay--bring personal belongings. 6. Bring a current list of your medications and current insurance cards. 7. You MUST have a responsible person to drive you home. 8. Someone MUST be with you the first 24 hours after you arrive home or your discharge will be delayed. 9. Please wear clothes that are easy to get on and off and wear slip-on shoes.    Please Note -A partner in care, someone in your support network, to:  Participate in the education session and assist where needed with home care   You will receive patient education and necessary resources for transmissions (special pillow and antennae) prior to leaving  Once you are  at home  Lay on the pillow and send a transmission every morning Make sure to take your Plavix 75 mg daily for one month and Aspirin __81___mg daily for life Our office will call you weekly for the first 3 weeks You will be seen for a follow up appointment on __8/10/22_______ You will receive periodic phone calls either from our office or automated system You will receive a bill from our office once a month

## 2021-06-21 ENCOUNTER — Other Ambulatory Visit (HOSPITAL_COMMUNITY): Payer: Medicare Other

## 2021-06-23 ENCOUNTER — Ambulatory Visit (HOSPITAL_COMMUNITY)
Admission: RE | Admit: 2021-06-23 | Discharge: 2021-06-23 | Disposition: A | Discharge status: Home / Self Care | Payer: Medicare Other | Attending: Cardiology | Admitting: Cardiology

## 2021-06-23 ENCOUNTER — Encounter (HOSPITAL_COMMUNITY)
Admission: RE | Disposition: A | Discharge status: Home / Self Care | Payer: Self-pay | Source: Home / Self Care | Attending: Cardiology

## 2021-06-23 ENCOUNTER — Other Ambulatory Visit: Payer: Self-pay

## 2021-06-23 DIAGNOSIS — Z794 Long term (current) use of insulin: Secondary | ICD-10-CM | POA: Diagnosis not present

## 2021-06-23 DIAGNOSIS — I130 Hypertensive heart and chronic kidney disease with heart failure and stage 1 through stage 4 chronic kidney disease, or unspecified chronic kidney disease: Secondary | ICD-10-CM | POA: Insufficient documentation

## 2021-06-23 DIAGNOSIS — I2510 Atherosclerotic heart disease of native coronary artery without angina pectoris: Secondary | ICD-10-CM | POA: Insufficient documentation

## 2021-06-23 DIAGNOSIS — Z7982 Long term (current) use of aspirin: Secondary | ICD-10-CM | POA: Insufficient documentation

## 2021-06-23 DIAGNOSIS — Z87891 Personal history of nicotine dependence: Secondary | ICD-10-CM | POA: Insufficient documentation

## 2021-06-23 DIAGNOSIS — I5032 Chronic diastolic (congestive) heart failure: Secondary | ICD-10-CM | POA: Insufficient documentation

## 2021-06-23 DIAGNOSIS — Z79899 Other long term (current) drug therapy: Secondary | ICD-10-CM | POA: Insufficient documentation

## 2021-06-23 DIAGNOSIS — Z951 Presence of aortocoronary bypass graft: Secondary | ICD-10-CM | POA: Diagnosis not present

## 2021-06-23 DIAGNOSIS — N183 Chronic kidney disease, stage 3 unspecified: Secondary | ICD-10-CM | POA: Insufficient documentation

## 2021-06-23 DIAGNOSIS — Z8249 Family history of ischemic heart disease and other diseases of the circulatory system: Secondary | ICD-10-CM | POA: Insufficient documentation

## 2021-06-23 DIAGNOSIS — Z7984 Long term (current) use of oral hypoglycemic drugs: Secondary | ICD-10-CM | POA: Insufficient documentation

## 2021-06-23 HISTORY — PX: PRESSURE SENSOR/CARDIOMEMS: CATH118258

## 2021-06-23 HISTORY — PX: RIGHT HEART CATH: CATH118263

## 2021-06-23 LAB — CBC
HCT: 37 % (ref 36.0–46.0)
Hemoglobin: 12.1 g/dL (ref 12.0–15.0)
MCH: 28.1 pg (ref 26.0–34.0)
MCHC: 32.7 g/dL (ref 30.0–36.0)
MCV: 85.8 fL (ref 80.0–100.0)
Platelets: 260 10*3/uL (ref 150–400)
RBC: 4.31 MIL/uL (ref 3.87–5.11)
RDW: 14 % (ref 11.5–15.5)
WBC: 12.6 10*3/uL — ABNORMAL HIGH (ref 4.0–10.5)
nRBC: 0 % (ref 0.0–0.2)

## 2021-06-23 LAB — POCT I-STAT EG7
Acid-Base Excess: 7 mmol/L — ABNORMAL HIGH (ref 0.0–2.0)
Acid-Base Excess: 8 mmol/L — ABNORMAL HIGH (ref 0.0–2.0)
Bicarbonate: 32.2 mmol/L — ABNORMAL HIGH (ref 20.0–28.0)
Bicarbonate: 32.4 mmol/L — ABNORMAL HIGH (ref 20.0–28.0)
Calcium, Ion: 1.2 mmol/L (ref 1.15–1.40)
Calcium, Ion: 1.21 mmol/L (ref 1.15–1.40)
HCT: 32 % — ABNORMAL LOW (ref 36.0–46.0)
HCT: 32 % — ABNORMAL LOW (ref 36.0–46.0)
Hemoglobin: 10.9 g/dL — ABNORMAL LOW (ref 12.0–15.0)
Hemoglobin: 10.9 g/dL — ABNORMAL LOW (ref 12.0–15.0)
O2 Saturation: 72 %
O2 Saturation: 75 %
Potassium: 3.4 mmol/L — ABNORMAL LOW (ref 3.5–5.1)
Potassium: 3.4 mmol/L — ABNORMAL LOW (ref 3.5–5.1)
Sodium: 137 mmol/L (ref 135–145)
Sodium: 137 mmol/L (ref 135–145)
TCO2: 34 mmol/L — ABNORMAL HIGH (ref 22–32)
TCO2: 34 mmol/L — ABNORMAL HIGH (ref 22–32)
pCO2, Ven: 46.3 mmHg (ref 44.0–60.0)
pCO2, Ven: 46.4 mmHg (ref 44.0–60.0)
pH, Ven: 7.451 — ABNORMAL HIGH (ref 7.250–7.430)
pH, Ven: 7.452 — ABNORMAL HIGH (ref 7.250–7.430)
pO2, Ven: 37 mmHg (ref 32.0–45.0)
pO2, Ven: 39 mmHg (ref 32.0–45.0)

## 2021-06-23 LAB — GLUCOSE, CAPILLARY
Glucose-Capillary: 136 mg/dL — ABNORMAL HIGH (ref 70–99)
Glucose-Capillary: 58 mg/dL — ABNORMAL LOW (ref 70–99)
Glucose-Capillary: 87 mg/dL (ref 70–99)

## 2021-06-23 SURGERY — RIGHT HEART CATH
Anesthesia: LOCAL

## 2021-06-23 MED ORDER — SODIUM CHLORIDE 0.9% FLUSH: 3.0000 mL | Freq: Two times a day (BID) | INTRAVENOUS | Status: DC

## 2021-06-23 MED ORDER — MIDAZOLAM HCL 2 MG/2ML IJ SOLN
INTRAMUSCULAR | Status: AC
  Filled 2021-06-23: qty 2

## 2021-06-23 MED ORDER — LIDOCAINE HCL (PF) 1 % IJ SOLN
INTRAMUSCULAR | Status: AC
  Filled 2021-06-23: qty 30

## 2021-06-23 MED ORDER — ONDANSETRON HCL 4 MG/2ML IJ SOLN: 4.0000 mg | Freq: Four times a day (QID) | INTRAMUSCULAR | Status: DC | PRN

## 2021-06-23 MED ORDER — FENTANYL CITRATE (PF) 100 MCG/2ML IJ SOLN
INTRAMUSCULAR | Status: DC | PRN
  Administered 2021-06-23 (×2): 25 ug via INTRAVENOUS

## 2021-06-23 MED ORDER — MIDAZOLAM HCL 2 MG/2ML IJ SOLN
INTRAMUSCULAR | Status: DC | PRN
  Administered 2021-06-23 (×3): 1 mg via INTRAVENOUS

## 2021-06-23 MED ORDER — LIDOCAINE HCL (PF) 1 % IJ SOLN
INTRAMUSCULAR | Status: DC | PRN
  Administered 2021-06-23: 15 mL

## 2021-06-23 MED ORDER — DEXTROSE 50 % IV SOLN
INTRAVENOUS | Status: AC
  Administered 2021-06-23: 50 mL
  Filled 2021-06-23: qty 50

## 2021-06-23 MED ORDER — HEPARIN (PORCINE) IN NACL 1000-0.9 UT/500ML-% IV SOLN
INTRAVENOUS | Status: AC
  Filled 2021-06-23: qty 500

## 2021-06-23 MED ORDER — SODIUM CHLORIDE 0.9 % IV SOLN: INTRAVENOUS | Status: DC

## 2021-06-23 MED ORDER — SODIUM CHLORIDE 0.9% FLUSH: 3.0000 mL | INTRAVENOUS | Status: DC | PRN

## 2021-06-23 MED ORDER — SODIUM CHLORIDE 0.9 % IV SOLN: 250.0000 mL | INTRAVENOUS | Status: DC | PRN

## 2021-06-23 MED ORDER — IOHEXOL 350 MG/ML SOLN
INTRAVENOUS | Status: DC | PRN
  Administered 2021-06-23: 10 mL

## 2021-06-23 MED ORDER — HYDRALAZINE HCL 20 MG/ML IJ SOLN: 10.0000 mg | INTRAMUSCULAR | Status: DC | PRN

## 2021-06-23 MED ORDER — FENTANYL CITRATE (PF) 100 MCG/2ML IJ SOLN
INTRAMUSCULAR | Status: AC
  Filled 2021-06-23: qty 2

## 2021-06-23 MED ORDER — LABETALOL HCL 5 MG/ML IV SOLN: 10.0000 mg | INTRAVENOUS | Status: DC | PRN

## 2021-06-23 MED ORDER — CLOPIDOGREL BISULFATE 75 MG PO TABS: 75.0000 mg | ORAL_TABLET | Freq: Every day | ORAL | 0 refills | Status: AC

## 2021-06-23 MED ORDER — HEPARIN (PORCINE) IN NACL 1000-0.9 UT/500ML-% IV SOLN
INTRAVENOUS | Status: DC | PRN
  Administered 2021-06-23 (×3): 500 mL

## 2021-06-23 MED ORDER — ACETAMINOPHEN 325 MG PO TABS: 650.0000 mg | ORAL_TABLET | ORAL | Status: DC | PRN

## 2021-06-23 MED ORDER — ASPIRIN 81 MG PO CHEW
CHEWABLE_TABLET | ORAL | Status: AC
  Filled 2021-06-23: qty 1

## 2021-06-23 MED ORDER — ASPIRIN 81 MG PO CHEW
81.0000 mg | CHEWABLE_TABLET | ORAL | Status: AC
  Administered 2021-06-23: 81 mg via ORAL

## 2021-06-23 SURGICAL SUPPLY — 12 items
CARDIOMEMS PA SENSOR W/DELIVER (Prosthesis & Implant Heart) ×2 IMPLANT
CATH SWAN GANZ 7F STRAIGHT (CATHETERS) ×1 IMPLANT
DILATOR VESSEL 10FR 20CM (INTRODUCER) ×1 IMPLANT
DILATOR VESSEL 38 20CM 8FR (INTRODUCER) ×1 IMPLANT
KIT HEART LEFT (KITS) ×2 IMPLANT
PACK CARDIAC CATHETERIZATION (CUSTOM PROCEDURE TRAY) ×2 IMPLANT
SENSOR CARDIOMEMS PA W/DELIVER (Prosthesis & Implant Heart) IMPLANT
SHEATH FAST CATH 12F 12CM (SHEATH) ×1 IMPLANT
SHEATH PROBE COVER 6X72 (BAG) ×1 IMPLANT
TRANSDUCER W/STOPCOCK (MISCELLANEOUS) ×2 IMPLANT
WIRE EMERALD 3MM-J .025X260CM (WIRE) ×2 IMPLANT
WIRE NITREX .018X300 STIFF (WIRE) ×1 IMPLANT

## 2021-06-23 NOTE — Progress Notes (Signed)
Patients CBG pre procedure 58 at 1145. Writer administered 25mg  Dextrose at 1200. Rechecked CBG at 1215 136. Dextrose sent with patient to procedure.

## 2021-06-23 NOTE — Progress Notes (Signed)
Site: Right femoral Sheath Size: 12 Fr Condition prior to removal:  Level 0 Type of pressure held: manual Time pressure held: 15 minutes Status of patient during pull: stable Condition of site post pull:  Level 0 Type of dressing applied: transparent Pulses verified: right DP 3+ Patient's condition post pull: stable Bedrest begins at: 1500 Post instructions given to patient: yes

## 2021-06-23 NOTE — Interval H&P Note (Signed)
History and Physical Interval Note:  06/23/2021 1:23 PM  [name]  has presented today for surgery, with the diagnosis of CHF.  The various methods of treatment have been discussed with the patient and family. After consideration of risks, benefits and other options for treatment, the patient has consented to  Procedure(s): RIGHT HEART CATH (N/A) PRESSURE SENSOR/CARDIOMEMS (N/A) as a surgical intervention.  The patient's history has been reviewed, patient examined, no change in status, stable for surgery.  I have reviewed the patient's chart and labs.  Questions were answered to the patient's satisfaction.     Necole Minassian [employer]

## 2021-06-24 ENCOUNTER — Encounter (HOSPITAL_COMMUNITY): Payer: Self-pay | Admitting: Cardiology

## 2021-06-28 DIAGNOSIS — R778 Other specified abnormalities of plasma proteins: Secondary | ICD-10-CM | POA: Diagnosis not present

## 2021-06-28 DIAGNOSIS — I10 Essential (primary) hypertension: Secondary | ICD-10-CM | POA: Diagnosis not present

## 2021-06-28 DIAGNOSIS — E119 Type 2 diabetes mellitus without complications: Secondary | ICD-10-CM | POA: Diagnosis not present

## 2021-06-28 DIAGNOSIS — R519 Headache, unspecified: Secondary | ICD-10-CM | POA: Diagnosis not present

## 2021-07-04 ENCOUNTER — Telehealth: Payer: Self-pay

## 2021-07-04 DIAGNOSIS — I10 Essential (primary) hypertension: Secondary | ICD-10-CM | POA: Diagnosis not present

## 2021-07-04 DIAGNOSIS — E119 Type 2 diabetes mellitus without complications: Secondary | ICD-10-CM | POA: Diagnosis not present

## 2021-07-04 DIAGNOSIS — R519 Headache, unspecified: Secondary | ICD-10-CM | POA: Diagnosis not present

## 2021-07-04 DIAGNOSIS — R778 Other specified abnormalities of plasma proteins: Secondary | ICD-10-CM | POA: Diagnosis not present

## 2021-07-04 MED ORDER — METOPROLOL TARTRATE 25 MG PO TABS: ORAL_TABLET | ORAL | 1 refills | Status: DC

## 2021-07-04 NOTE — Telephone Encounter (Signed)
Medication refill request approved for Metoprolol Tartrate 25 mg tablets and sent to [facility].

## 2021-07-05 ENCOUNTER — Other Ambulatory Visit: Payer: Self-pay | Admitting: Family Medicine

## 2021-07-06 ENCOUNTER — Encounter (HOSPITAL_COMMUNITY): Payer: Medicare Other

## 2021-07-14 ENCOUNTER — Telehealth (HOSPITAL_COMMUNITY): Payer: Self-pay | Admitting: *Deleted

## 2021-07-14 NOTE — Telephone Encounter (Signed)
Pt called stating over the past month she has felt drained and had no energy. Pt said shes felt like this since increasing hydralazine to 100mg  tid. Pts bp 120s/50s. Per Brittainy Simmons,PA pt can decrease hydralazine to 75mg  tid and keep a bp log. Call if she notices elevated bp. Called pt no answer/left vm requesting return call.

## 2021-07-23 NOTE — Progress Notes (Signed)
PCP: Toma Deiters, MD Cardiology: Dr. Wyline Mood HF Cardiology: Dr. Shirlee Latch  75 y.o. with history of CAD, diastolic CHF, and poorly controlled HTN was referred by Dr. Wyline Mood for CHF evaluation. Patient had CABG in 2008.  She has poorly controlled HTN and CKD stage 3.  She was admitted in 10/21 with hypertensive urgency and CHF due to medication noncompliance.  She was admitted again in 3/22 with acute diastolic CHF, AKI to 2.9. HS-TnI was 1777 but cath was not done due to AKI.  Echo showed EF 60-65% with normal RV.  She was again admitted (this time to [facility]) in 5/22 with CHF.  No med changes were made.  Given multiple admission with CHF and difficulty managing volume, she was referred to CHF clinic.  Torsemide was increased and Jardiance was started.   LHC/RHC in 6/22 showed mildly elevated RA pressure, normal PCWP.  LIMA-LAD and SVG-PDA were patent, sequential SVG-OM and ramus only touched down on OM.  Native RCA has a heavily calcified ostium with 95% stenosis, very difficult to seat the catheter due to calcification.  The native PDA is occluded at the ostium, and SVG-PDA only fills the PDA (does not back-fill to PLV and RCA).  Therefore, PLV territory is likely jeopardized by the ostial RCA stenosis. I reviewed the films with Dr. [place].  Potentially could intervene on ostial RCA using atherectomy (would be difficult).  As patient's primary symptoms are CHF-related and this is improved with diuresis, will manage medically now with option for PCI if she develops chest pain, etc.  Volume overloaded at AHF follow up visits and her torsemide has been increased.  S/p CardioMems 06/23/21.  Today she returns for post-cardioMems implant HF follow up. Tripped and fell yesterday, did not hit head. Gaining about 1 lb a day over last 4 days. Cannot tolerate 2 tablets of hydralazine so she has decreased herself to 1 tablet/tid. SOB walking on flat ground, and is swelling in her legs. On oxygen 2L  continuously.  Denies CP, dizziness, or PND/Orthopnea. Appetite ok. No fever or chills. Weight at home 202 pounds. Taking all medications.   Cardiomems (goal 14): today's readings 15.  Labs (5/22): Pro-BNP 2347, K 4.1, creatinine 1.46 Labs (6/22): K 3.9, creatinine 1.75 Labs (7/22): K 3.5, creatinine 1.62  PMH: 1. Hyperlipidemia 2. HTN 3. CAD: CABG 2008 with LIMA-LAD, SVG-ramus, SVG-OM, SVG-PDA.  - Cardiolite (11/21): Small reversible inferolateral defect.  - LHC (6/22): LIMA-LAD and SVG-PDA were patent, sequential SVG-OM and ramus only touched down on OM.  Native RCA has a heavily calcified ostium with 95% stenosis, very difficult to seat the catheter due to calcification.  The native PDA is occluded at the ostium, and SVG-PDA only fills the PDA (does not back-fill to PLV and RCA).  Therefore, PLV territory is likely jeopardized by the ostial RCA stenosis. I reviewed the films with Dr. [place].  Potentially could intervene on ostial RCA using atherectomy (would be difficult).  As patient's primary symptoms are CHF-related and this is improved with diuresis, will manage medically now with option for PCI if she develops chest pain, etc. 4. Chronic diastolic CHF: Echo (3/22) with EF 60-65%, normal RV.  - Multiple admission with CHF.  - RHC (6/22): mean RA 10, PA 42/15 mean 25, mean PCWP 13, CI 3.72, PVR 1.7 WU.  5. CKD: Stage 3.   SH: Lives in [place], retired from [employer], lives alone, has a son in [place].  Quit smoking in 2003, no ETOH.  Family History  Problem Relation Age of Onset   Cancer Other        Family Hx of Cancer   Coronary artery disease Other        Family Hx of CAD   ROS: All systems reviewed and negative except as per HPI.   Current Outpatient Medications  Medication Sig Dispense Refill   acetaminophen (TYLENOL) 500 MG tablet Take 500-1,000 mg by mouth every 6 (six) hours as needed (pain).     ALPRAZolam (XANAX) 0.5 MG tablet Take 0.5 mg by  mouth at bedtime as needed for anxiety or sleep.     aspirin EC 81 MG tablet Take 81 mg by mouth every evening. Swallow whole.     atorvastatin (LIPITOR) 40 MG tablet Take 40 mg by mouth every evening.  0   empagliflozin (JARDIANCE) 10 MG TABS tablet Take 1 tablet (10 mg total) by mouth daily before breakfast. 90 tablet 3   HUMALOG 100 UNIT/ML injection Inject 0.75 Units into the skin with breakfast, with lunch, and with evening meal. With insulin pump 24 hours adjust sometime when eat     hydrALAZINE (APRESOLINE) 50 MG tablet Take 2 tablets (100 mg total) by mouth 3 (three) times daily. 405 tablet 3   Insulin Disposable Pump (OMNIPOD DASH PODS, GEN 4,) MISC Take by mouth every 3 (three) days.     Insulin Pen Needle (COMFORT TOUCH INSULIN PEN NEED) 31G X 5 MM MISC 1 each by Does not apply route in the morning, at noon, in the evening, and at bedtime. 100 each 0   ipratropium (ATROVENT) 0.03 % nasal spray Place 1 spray into both nostrils 4 (four) times daily as needed (allergies).     isosorbide dinitrate (ISORDIL) 10 MG tablet Take 10 mg by mouth 3 (three) times daily.     loratadine (CLARITIN) 10 MG tablet Take 10 mg by mouth daily as needed for allergies.     metoprolol tartrate (LOPRESSOR) 25 MG tablet Take 1/2 (one-half) tablet by mouth twice daily 30 tablet 0   nitroGLYCERIN (NITROSTAT) 0.4 MG SL tablet DISSOLVE ONE TABLET UNDER THE TONGUE EVERY 5 MINUTES AS NEEDED FOR CHEST PAIN.  DO NOT EXCEED A TOTAL OF 3 DOSES IN 15 MINUTES 25 tablet 3   omeprazole (PRILOSEC) 20 MG capsule Take 20 mg by mouth in the morning.     ondansetron (ZOFRAN) 4 MG tablet Take 4 mg by mouth every 6 (six) hours as needed for nausea.     ONETOUCH VERIO test strip      OXYGEN Inhale 2 L into the lungs daily as needed (Active).     potassium chloride SA (KLOR-CON) 20 MEQ tablet Take 1 tablet (20 mEq total) by mouth daily. 90 tablet 3   promethazine (PHENERGAN) 25 MG tablet Take 12.5 mg by mouth every 6 (six) hours as  needed for vomiting or nausea.     torsemide (DEMADEX) 20 MG tablet Take 3 tablets (60 mg total) by mouth 2 (two) times daily. 180 tablet 3   traMADol (ULTRAM) 50 MG tablet Take 50 mg by mouth 3 (three) times daily as needed (arthritis pain).     Cholecalciferol (VITAMIN D) 125 MCG (5000 UT) CAPS Take 5,000 Units by mouth daily with lunch. (Patient not taking: Reported on 07/25/2021)     omega-3 fish oil (MAXEPA) 1000 MG CAPS capsule Take 1,000 mg by mouth in the morning. (Patient not taking: Reported on 07/25/2021)     vitamin C (ASCORBIC ACID) 500  MG tablet Take 500 mg by mouth in the morning. (Patient not taking: Reported on 07/25/2021)     No current facility-administered medications for this encounter.   BP (!) 166/50   Pulse 70   Ht 5' (1.524 m)   Wt 91.9 kg (202 lb 9.6 oz)   SpO2 96%   BMI 39.57 kg/m   Wt Readings from Last 3 Encounters:  07/25/21 91.9 kg (202 lb 9.6 oz)  06/23/21 86.2 kg (190 lb)  06/15/21 88.6 kg (195 lb 6.4 oz)   General:  NAD. No resp difficulty, on oxygen HEENT: Normal Neck: Supple. JVP 6-7. Carotids 2+ bilat; no bruits. No lymphadenopathy or thryomegaly appreciated. Cor: PMI nondisplaced. Regular rate & rhythm. No rubs, gallops or murmurs. Lungs: Clear Abdomen: Obese,  nontender, nondistended. No hepatosplenomegaly. No bruits or masses. Good bowel sounds. Extremities: No cyanosis, clubbing, rash, 1+ BLE edema Neuro: Alert & oriented x 3, cranial nerves grossly intact. Moves all 4 extremities w/o difficulty. Affect pleasant.  Assessment/Plan: 1. Chronic diastolic CHF: Echo in 3/22 with EF 60-65%, normal RV.  She has had 3 admissions in the last year with CHF.  She does not have LVH on echo so suspect cardiac amyloidosis is unlikely.  NYHA class III symptoms, still mildly volume overloaded.  Volume management is complicated by CKD stage 3.   - Increase torsemide to 80 mg bid x 3 days then back to 60 mg bid. BMET today, repeat in 10 days. Will follow  CardioMems readings. - Continue Jardiance 10 mg daily.  2. HTN: Elevated today. She tells me she has white coat syndrome and that her BP at home ~130/60. May require addition of amlodipine. - Continue hydralazine 25 mg tid + isordil 10 mg tid. 3. CAD: S/p CABG in 2008.  HS-TnI up to 1777 during 3/22 admission but no cath due to AKI.  She has prominent exertional dyspnea but only atypical chest pain.  LHC in 6/22 showed that LIMA-LAD and SVG-PDA were patent, sequential SVG-OM and ramus only touched down on OM.  Native RCA has a heavily calcified ostium with 95% stenosis, very difficult to seat the catheter due to calcification.  The native PDA is occluded at the ostium, and SVG-PDA only fills the PDA (does not back-fill to PLV and RCA).  Therefore, PLV territory is likely jeopardized by the ostial RCA stenosis. I reviewed the films with Dr. [place].  Potentially could intervene on ostial RCA using atherectomy (would be difficult).  As patient's primary symptoms are CHF-related and this is improved with diuresis, will manage medically now with option for PCI if she develops chest pain, etc.  - Continue ASA 81 daily.  - Continue atorvastatin 40 mg daily.  - Continue metoprolol 12.5 bid.  4. Suspect OSA: She says that she would not wear CPAP, so sleep study not ordered.  5. CKD stage 3: Last creatinine 1.75.  BMET today.   Followup in 3-4 weeks with APP.   Anderson Malta [facility] FNP 07/25/2021

## 2021-07-25 ENCOUNTER — Encounter (HOSPITAL_COMMUNITY): Payer: Self-pay

## 2021-07-25 ENCOUNTER — Other Ambulatory Visit: Payer: Self-pay

## 2021-07-25 ENCOUNTER — Ambulatory Visit (HOSPITAL_COMMUNITY)
Admission: RE | Admit: 2021-07-25 | Discharge: 2021-07-25 | Disposition: A | Discharge status: Home / Self Care | Payer: Medicare Other | Source: Ambulatory Visit | Attending: Family Medicine | Admitting: Family Medicine

## 2021-07-25 VITALS — BP 166/50 | HR 70 | Ht 60.0 in | Wt 202.6 lb

## 2021-07-25 DIAGNOSIS — I2510 Atherosclerotic heart disease of native coronary artery without angina pectoris: Secondary | ICD-10-CM | POA: Diagnosis not present

## 2021-07-25 DIAGNOSIS — I130 Hypertensive heart and chronic kidney disease with heart failure and stage 1 through stage 4 chronic kidney disease, or unspecified chronic kidney disease: Secondary | ICD-10-CM | POA: Insufficient documentation

## 2021-07-25 DIAGNOSIS — I5033 Acute on chronic diastolic (congestive) heart failure: Secondary | ICD-10-CM | POA: Diagnosis not present

## 2021-07-25 DIAGNOSIS — Z7982 Long term (current) use of aspirin: Secondary | ICD-10-CM | POA: Insufficient documentation

## 2021-07-25 DIAGNOSIS — Z951 Presence of aortocoronary bypass graft: Secondary | ICD-10-CM | POA: Insufficient documentation

## 2021-07-25 DIAGNOSIS — I2584 Coronary atherosclerosis due to calcified coronary lesion: Secondary | ICD-10-CM | POA: Diagnosis not present

## 2021-07-25 DIAGNOSIS — I5032 Chronic diastolic (congestive) heart failure: Secondary | ICD-10-CM | POA: Insufficient documentation

## 2021-07-25 DIAGNOSIS — N183 Chronic kidney disease, stage 3 unspecified: Secondary | ICD-10-CM | POA: Diagnosis not present

## 2021-07-25 DIAGNOSIS — I10 Essential (primary) hypertension: Secondary | ICD-10-CM

## 2021-07-25 DIAGNOSIS — R0789 Other chest pain: Secondary | ICD-10-CM | POA: Insufficient documentation

## 2021-07-25 DIAGNOSIS — Z87891 Personal history of nicotine dependence: Secondary | ICD-10-CM | POA: Insufficient documentation

## 2021-07-25 DIAGNOSIS — Z79899 Other long term (current) drug therapy: Secondary | ICD-10-CM | POA: Insufficient documentation

## 2021-07-25 LAB — BASIC METABOLIC PANEL
Anion gap: 9 (ref 5–15)
BUN: 27 mg/dL — ABNORMAL HIGH (ref 8–23)
CO2: 34 mmol/L — ABNORMAL HIGH (ref 22–32)
Calcium: 8.9 mg/dL (ref 8.9–10.3)
Chloride: 95 mmol/L — ABNORMAL LOW (ref 98–111)
Creatinine, Ser: 1.57 mg/dL — ABNORMAL HIGH (ref 0.44–1.00)
GFR, Estimated: 35 mL/min — ABNORMAL LOW (ref >60)
Glucose, Bld: 198 mg/dL — ABNORMAL HIGH (ref 70–99)
Potassium: 3.2 mmol/L — ABNORMAL LOW (ref 3.5–5.1)
Sodium: 138 mmol/L (ref 135–145)

## 2021-07-25 MED ORDER — TORSEMIDE 20 MG PO TABS: 80.0000 mg | ORAL_TABLET | Freq: Two times a day (BID) | ORAL | 3 refills | Status: DC

## 2021-07-25 MED ORDER — HYDRALAZINE HCL 50 MG PO TABS: ORAL_TABLET | ORAL | Status: DC

## 2021-07-25 NOTE — Patient Instructions (Addendum)
Increase Torsemide to 80 mg  Twice daily x 3 days then back to 60 mg  Twice daily  Labs done today, your results will be available in MyChart, we will contact you for abnormal readings.  Follow up lab work in 10 days  Your physician recommends that you schedule a follow-up appointment in: 4 weeks  .If you have any questions or concerns before your next appointment please send [place] a message through [place] or call our office at [phone].    TO LEAVE A MESSAGE FOR THE NURSE SELECT OPTION 2, PLEASE LEAVE A MESSAGE INCLUDING: YOUR NAME DATE OF BIRTH CALL BACK NUMBER REASON FOR CALL**this is important as we prioritize the call backs  YOU WILL RECEIVE A CALL BACK THE SAME DAY AS LONG AS YOU CALL BEFORE 4:00 PM  At the [facility], you and your health needs are our priority. As part of our continuing mission to provide you with exceptional heart care, we have created designated Provider Care Teams. These Care Teams include your primary Cardiologist (physician) and Advanced Practice Providers (APPs- Physician Assistants and Nurse Practitioners) who all work together to provide you with the care you need, when you need it.   You may see any of the following providers on your designated Care Team at your next follow up: Dr Arvilla Meres Dr Marca Ancona Dr Brandon Melnick, NP Robbie Lis, [place] Mikki Santee Karle Plumber, PharmD   Please be sure to bring in all your medications bottles to every appointment.

## 2021-07-26 ENCOUNTER — Telehealth (HOSPITAL_COMMUNITY): Payer: Self-pay

## 2021-07-26 ENCOUNTER — Encounter (HOSPITAL_COMMUNITY): Payer: Self-pay

## 2021-07-26 NOTE — Progress Notes (Unsigned)
As per patient request prescription for lab work to be done closer to patient mailed to patient's home address.

## 2021-07-27 DIAGNOSIS — I5022 Chronic systolic (congestive) heart failure: Secondary | ICD-10-CM | POA: Diagnosis not present

## 2021-07-27 DIAGNOSIS — K219 Gastro-esophageal reflux disease without esophagitis: Secondary | ICD-10-CM | POA: Diagnosis not present

## 2021-07-27 DIAGNOSIS — E7849 Other hyperlipidemia: Secondary | ICD-10-CM | POA: Diagnosis not present

## 2021-07-27 DIAGNOSIS — Z0000 Encounter for general adult medical examination without abnormal findings: Secondary | ICD-10-CM | POA: Diagnosis not present

## 2021-07-27 DIAGNOSIS — E1121 Type 2 diabetes mellitus with diabetic nephropathy: Secondary | ICD-10-CM | POA: Diagnosis not present

## 2021-07-27 DIAGNOSIS — I10 Essential (primary) hypertension: Secondary | ICD-10-CM | POA: Diagnosis not present

## 2021-07-29 DIAGNOSIS — R778 Other specified abnormalities of plasma proteins: Secondary | ICD-10-CM | POA: Diagnosis not present

## 2021-07-29 DIAGNOSIS — E119 Type 2 diabetes mellitus without complications: Secondary | ICD-10-CM | POA: Diagnosis not present

## 2021-07-29 DIAGNOSIS — I10 Essential (primary) hypertension: Secondary | ICD-10-CM | POA: Diagnosis not present

## 2021-07-29 DIAGNOSIS — R519 Headache, unspecified: Secondary | ICD-10-CM | POA: Diagnosis not present

## 2021-08-04 DIAGNOSIS — E119 Type 2 diabetes mellitus without complications: Secondary | ICD-10-CM | POA: Diagnosis not present

## 2021-08-04 DIAGNOSIS — I10 Essential (primary) hypertension: Secondary | ICD-10-CM | POA: Diagnosis not present

## 2021-08-04 DIAGNOSIS — R778 Other specified abnormalities of plasma proteins: Secondary | ICD-10-CM | POA: Diagnosis not present

## 2021-08-04 DIAGNOSIS — R519 Headache, unspecified: Secondary | ICD-10-CM | POA: Diagnosis not present

## 2021-08-05 DIAGNOSIS — I5032 Chronic diastolic (congestive) heart failure: Secondary | ICD-10-CM | POA: Diagnosis not present

## 2021-08-10 ENCOUNTER — Telehealth (HOSPITAL_COMMUNITY): Payer: Self-pay | Admitting: Adult Health

## 2021-08-10 NOTE — Telephone Encounter (Signed)
  I called regarding   Cardiomems Goal 14  Todays reading is 21 suggestive of fluid accumulation.   She tells me she has some lower extremity edema.  Discussed low salt choices and limiting fluid intake < 2 liters.   She also requested appointment change. Appointment changed to 08/31/21 at 2:30.   [name] appreciated the phone call.   Latroya Ng NP-C  2:57 PM

## 2021-08-11 DIAGNOSIS — M810 Age-related osteoporosis without current pathological fracture: Secondary | ICD-10-CM | POA: Diagnosis not present

## 2021-08-23 ENCOUNTER — Encounter (HOSPITAL_COMMUNITY): Payer: Medicare Other

## 2021-08-28 DIAGNOSIS — R519 Headache, unspecified: Secondary | ICD-10-CM | POA: Diagnosis not present

## 2021-08-28 DIAGNOSIS — E119 Type 2 diabetes mellitus without complications: Secondary | ICD-10-CM | POA: Diagnosis not present

## 2021-08-28 DIAGNOSIS — R778 Other specified abnormalities of plasma proteins: Secondary | ICD-10-CM | POA: Diagnosis not present

## 2021-08-28 DIAGNOSIS — I10 Essential (primary) hypertension: Secondary | ICD-10-CM | POA: Diagnosis not present

## 2021-08-29 NOTE — Progress Notes (Signed)
PCP: Toma Deiters, MD Cardiology: Dr. Wyline Mood HF Cardiology: Dr. Shirlee Latch  75 y.o. with history of CAD, diastolic CHF, and poorly controlled HTN was referred by Dr. Wyline Mood for CHF evaluation. Patient had CABG in 2008.  She has poorly controlled HTN and CKD stage 3.  She was admitted in 10/21 with hypertensive urgency and CHF due to medication noncompliance.  She was admitted again in 3/22 with acute diastolic CHF, AKI to 2.9. HS-TnI was 1777 but cath was not done due to AKI.  Echo showed EF 60-65% with normal RV.  She was again admitted (this time to [facility]) in 5/22 with CHF.  No med changes were made.  Given multiple admission with CHF and difficulty managing volume, she was referred to CHF clinic.  Torsemide was increased and Jardiance was started.   LHC/RHC in 6/22 showed mildly elevated RA pressure, normal PCWP.  LIMA-LAD and SVG-PDA were patent, sequential SVG-OM and ramus only touched down on OM.  Native RCA has a heavily calcified ostium with 95% stenosis, very difficult to seat the catheter due to calcification.  The native PDA is occluded at the ostium, and SVG-PDA only fills the PDA (does not back-fill to PLV and RCA).  Therefore, PLV territory is likely jeopardized by the ostial RCA stenosis. I reviewed the films with Dr. [place].  Potentially could intervene on ostial RCA using atherectomy (would be difficult).  As patient's primary symptoms are CHF-related and this is improved with diuresis, will manage medically now with option for PCI if she develops chest pain, etc.  Volume overloaded at AHF follow up visits and her torsemide has been increased.  S/p CardioMems 06/23/21.  Post Cardiomems follow up she was volume overloaded and we increased her torsemide for 3 days.  Today she returns for HF follow up. She ran out of Jardiance 2 weeks ago. Has noticed more swelling in ankles. She has a URI and has been taking Mucinex. Breathing about the same, dyspneic with minimal activity. Denies  CP, dizziness,or PND/Orthopnea. Appetite ok. No fever or chills. Weight at home 204 pounds and has been creeping up. She is on 2L oxygen continuously.  Cardiomems (goal 18): today's readings 20.  Labs (5/22): Pro-BNP 2347, K 4.1, creatinine 1.46 Labs (6/22): K 3.9, creatinine 1.75 Labs (7/22): K 3.5, creatinine 1.62 Labs (9/22): K 4.2, creatinine 1.52  PMH: 1. Hyperlipidemia 2. HTN 3. CAD: CABG 2008 with LIMA-LAD, SVG-ramus, SVG-OM, SVG-PDA.  - Cardiolite (11/21): Small reversible inferolateral defect.  - LHC (6/22): LIMA-LAD and SVG-PDA were patent, sequential SVG-OM and ramus only touched down on OM.  Native RCA has a heavily calcified ostium with 95% stenosis, very difficult to seat the catheter due to calcification.  The native PDA is occluded at the ostium, and SVG-PDA only fills the PDA (does not back-fill to PLV and RCA).  Therefore, PLV territory is likely jeopardized by the ostial RCA stenosis. I reviewed the films with Dr. [place].  Potentially could intervene on ostial RCA using atherectomy (would be difficult).  As patient's primary symptoms are CHF-related and this is improved with diuresis, will manage medically now with option for PCI if she develops chest pain, etc. 4. Chronic diastolic CHF: Echo (3/22) with EF 60-65%, normal RV.  - Multiple admission with CHF.  - RHC (6/22): mean RA 10, PA 42/15 mean 25, mean PCWP 13, CI 3.72, PVR 1.7 WU.  5. CKD: Stage 3.   SH: Lives in [place], retired from [employer], lives alone, has a  son in [place].  Quit smoking in 2003, no ETOH.   Family History  Problem Relation Age of Onset   Cancer Other        Family Hx of Cancer   Coronary artery disease Other        Family Hx of CAD   ROS: All systems reviewed and negative except as per HPI.   Current Outpatient Medications  Medication Sig Dispense Refill   acetaminophen (TYLENOL) 500 MG tablet Take 500-1,000 mg by mouth every 6 (six) hours as needed (pain).      ALPRAZolam (XANAX) 0.5 MG tablet Take 0.5 mg by mouth at bedtime as needed for anxiety or sleep.     aspirin EC 81 MG tablet Take 81 mg by mouth every evening. Swallow whole.     atorvastatin (LIPITOR) 40 MG tablet Take 40 mg by mouth every evening.  0   Calcium Carb-Cholecalciferol (CALCIUM 500 + D) 500-125 MG-UNIT TABS Take by mouth. Patient takes 1 daily.     Cholecalciferol (VITAMIN D) 125 MCG (5000 UT) CAPS Take 5,000 Units by mouth daily with lunch.     HUMALOG 100 UNIT/ML injection Inject 0.75 Units into the skin with breakfast, with lunch, and with evening meal. With insulin pump 24 hours adjust sometime when eat     hydrALAZINE (APRESOLINE) 50 MG tablet Taking one tablet by mouth three times daily     ibandronate (BONIVA) 150 MG tablet Take 150 mg by mouth every 30 (thirty) days.     Insulin Disposable Pump (OMNIPOD DASH PODS, GEN 4,) MISC Take by mouth every 3 (three) days.     Insulin Pen Needle (COMFORT TOUCH INSULIN PEN NEED) 31G X 5 MM MISC 1 each by Does not apply route in the morning, at noon, in the evening, and at bedtime. 100 each 0   ipratropium (ATROVENT) 0.03 % nasal spray Place 1 spray into both nostrils 4 (four) times daily as needed (allergies).     isosorbide dinitrate (ISORDIL) 10 MG tablet Take 10 mg by mouth 3 (three) times daily.     loratadine (CLARITIN) 10 MG tablet Take 10 mg by mouth daily as needed for allergies.     metoprolol tartrate (LOPRESSOR) 25 MG tablet Take 1/2 (one-half) tablet by mouth twice daily 30 tablet 0   nitroGLYCERIN (NITROSTAT) 0.4 MG SL tablet DISSOLVE ONE TABLET UNDER THE TONGUE EVERY 5 MINUTES AS NEEDED FOR CHEST PAIN.  DO NOT EXCEED A TOTAL OF 3 DOSES IN 15 MINUTES 25 tablet 3   omega-3 fish oil (MAXEPA) 1000 MG CAPS capsule Take 1,000 mg by mouth in the morning.     omeprazole (PRILOSEC) 20 MG capsule Take 20 mg by mouth in the morning.     ondansetron (ZOFRAN) 4 MG tablet Take 4 mg by mouth every 6 (six) hours as needed for nausea.      ONETOUCH VERIO test strip      OXYGEN Inhale 2 L into the lungs daily as needed (Active).     PE-Triprolidine-DM-GG-APAP (MUCINEX CNG/CGH/COLD/FLU DY/NT PO) Take by mouth. Patient takes 2 tablets every 4 hours.     potassium chloride SA (KLOR-CON) 20 MEQ tablet Take 1 tablet (20 mEq total) by mouth daily. 90 tablet 3   promethazine (PHENERGAN) 25 MG tablet Take 12.5 mg by mouth every 6 (six) hours as needed for vomiting or nausea.     torsemide (DEMADEX) 20 MG tablet Take 60 mg by mouth 2 (two) times daily.     traMADol (  ULTRAM) 50 MG tablet Take 50 mg by mouth 3 (three) times daily as needed (arthritis pain).     vitamin C (ASCORBIC ACID) 500 MG tablet Take 500 mg by mouth in the morning.     empagliflozin (JARDIANCE) 10 MG TABS tablet Take 1 tablet (10 mg total) by mouth daily before breakfast. (Patient not taking: Reported on 08/31/2021) 90 tablet 3   No current facility-administered medications for this encounter.   BP 132/66   Pulse (!) 55   Wt 93.6 kg (206 lb 4.8 oz)   SpO2 96%   BMI 40.29 kg/m   Wt Readings from Last 3 Encounters:  08/31/21 93.6 kg (206 lb 4.8 oz)  07/25/21 91.9 kg (202 lb 9.6 oz)  06/23/21 86.2 kg (190 lb)   General:  NAD. No resp difficulty, arrived in [facility] on home oxygen HEENT: Normal Neck: Supple. Thick neck. Carotids 2+ bilat; no bruits. No lymphadenopathy or thryomegaly appreciated. Cor: PMI nondisplaced. Regular rate & rhythm. No rubs, gallops or murmurs. Lungs: Faint wheeze RUL Abdomen: Soft, nontender, nondistended. No hepatosplenomegaly. No bruits or masses. Good bowel sounds. Extremities: No cyanosis, clubbing, rash, 2+ pre-tibial edema.  Neuro: Alert & oriented x 3, cranial nerves grossly intact. Moves all 4 extremities w/o difficulty. Affect pleasant.  Assessment/Plan: 1. Acute on Chronic diastolic CHF: Echo in 3/22 with EF 60-65%, normal RV.  She has had 3 admissions in the last year with CHF.  She does not have LVH on echo so suspect cardiac  amyloidosis is unlikely.  NYHA class III symptoms, still mildly volume overloaded likely in setting of stopping Jardiance.  Volume management is complicated by CKD stage 3.   - Restart Jardiance 10 mg daily. BMET today, repeat in 10 days. - Increase torsemide to 80 mg bid x 3 days then back to 60 mg bid. Encouraged compliance with KCl supplements. Will follow CardioMems readings. 2. HTN: Better today. Diurese as above. - Continue hydralazine 25 mg tid + isordil 10 mg tid. 3. CAD: S/p CABG in 2008.  HS-TnI up to 1777 during 3/22 admission but no cath due to AKI.  She has prominent exertional dyspnea but only atypical chest pain.  LHC in 6/22 showed that LIMA-LAD and SVG-PDA were patent, sequential SVG-OM and ramus only touched down on OM.  Native RCA has a heavily calcified ostium with 95% stenosis, very difficult to seat the catheter due to calcification.  The native PDA is occluded at the ostium, and SVG-PDA only fills the PDA (does not back-fill to PLV and RCA).  Therefore, PLV territory is likely jeopardized by the ostial RCA stenosis. I reviewed the films with Dr. [place].  Potentially could intervene on ostial RCA using atherectomy (would be difficult).  As patient's primary symptoms are CHF-related and this is improved with diuresis, will manage medically now with option for PCI if she develops chest pain, etc.  - Continue ASA 81 daily.  - Continue atorvastatin 40 mg daily.  - Continue metoprolol 12.5 bid.  4. Suspect OSA: She says that she would not wear CPAP, so sleep study not ordered.  5. CKD stage 3: Last creatinine 1.75.  BMET today.   Followup in 1 month with APP and 3 months with Dr. Shirlee Latch.  Anderson Malta [facility] FNP 08/31/2021

## 2021-08-30 ENCOUNTER — Encounter (HOSPITAL_COMMUNITY): Payer: Medicare Other

## 2021-08-31 ENCOUNTER — Ambulatory Visit (HOSPITAL_COMMUNITY)
Admission: RE | Admit: 2021-08-31 | Discharge: 2021-08-31 | Disposition: A | Discharge status: Home / Self Care | Payer: Medicare Other | Source: Ambulatory Visit | Attending: Family Medicine | Admitting: Family Medicine

## 2021-08-31 ENCOUNTER — Encounter (HOSPITAL_COMMUNITY): Payer: Self-pay

## 2021-08-31 ENCOUNTER — Other Ambulatory Visit (HOSPITAL_COMMUNITY): Payer: Self-pay | Admitting: *Deleted

## 2021-08-31 ENCOUNTER — Other Ambulatory Visit: Payer: Self-pay

## 2021-08-31 VITALS — BP 132/66 | HR 55 | Wt 206.3 lb

## 2021-08-31 DIAGNOSIS — Z79899 Other long term (current) drug therapy: Secondary | ICD-10-CM | POA: Insufficient documentation

## 2021-08-31 DIAGNOSIS — Z794 Long term (current) use of insulin: Secondary | ICD-10-CM | POA: Diagnosis not present

## 2021-08-31 DIAGNOSIS — Z8249 Family history of ischemic heart disease and other diseases of the circulatory system: Secondary | ICD-10-CM | POA: Diagnosis not present

## 2021-08-31 DIAGNOSIS — Z951 Presence of aortocoronary bypass graft: Secondary | ICD-10-CM | POA: Insufficient documentation

## 2021-08-31 DIAGNOSIS — Z87891 Personal history of nicotine dependence: Secondary | ICD-10-CM | POA: Insufficient documentation

## 2021-08-31 DIAGNOSIS — I10 Essential (primary) hypertension: Secondary | ICD-10-CM

## 2021-08-31 DIAGNOSIS — I130 Hypertensive heart and chronic kidney disease with heart failure and stage 1 through stage 4 chronic kidney disease, or unspecified chronic kidney disease: Secondary | ICD-10-CM | POA: Insufficient documentation

## 2021-08-31 DIAGNOSIS — Z7982 Long term (current) use of aspirin: Secondary | ICD-10-CM | POA: Diagnosis not present

## 2021-08-31 DIAGNOSIS — Z9981 Dependence on supplemental oxygen: Secondary | ICD-10-CM | POA: Insufficient documentation

## 2021-08-31 DIAGNOSIS — I5033 Acute on chronic diastolic (congestive) heart failure: Secondary | ICD-10-CM

## 2021-08-31 DIAGNOSIS — N183 Chronic kidney disease, stage 3 unspecified: Secondary | ICD-10-CM | POA: Insufficient documentation

## 2021-08-31 DIAGNOSIS — I2510 Atherosclerotic heart disease of native coronary artery without angina pectoris: Secondary | ICD-10-CM | POA: Insufficient documentation

## 2021-08-31 LAB — BASIC METABOLIC PANEL
Anion gap: 10 (ref 5–15)
BUN: 19 mg/dL (ref 8–23)
CO2: 32 mmol/L (ref 22–32)
Calcium: 7.6 mg/dL — ABNORMAL LOW (ref 8.9–10.3)
Chloride: 96 mmol/L — ABNORMAL LOW (ref 98–111)
Creatinine, Ser: 1.36 mg/dL — ABNORMAL HIGH (ref 0.44–1.00)
GFR, Estimated: 41 mL/min — ABNORMAL LOW (ref >60)
Glucose, Bld: 57 mg/dL — ABNORMAL LOW (ref 70–99)
Potassium: 2.8 mmol/L — ABNORMAL LOW (ref 3.5–5.1)
Sodium: 138 mmol/L (ref 135–145)

## 2021-08-31 NOTE — Patient Instructions (Addendum)
Re-start Jardiance 10 mg daily - you have home supply with refills available. Increase Torsemide to 80 mg twice daily for 3 days then take 60 mg twice daily.  Stop Mucinex DM. You may take Delysm syrup (over the counter).  Return in one month to see Shanda Bumps - appointment made. Return in 3 months to see Dr. Shirlee Latch - we will call you with appointment.  Will need repeat BMP (lab) in 10 days. Rx given for BMP on 09/13/21 at [facility]; results to be faxed to [place].

## 2021-09-01 ENCOUNTER — Telehealth (HOSPITAL_COMMUNITY): Payer: Self-pay

## 2021-09-01 MED ORDER — EMPAGLIFLOZIN 10 MG PO TABS
10.0000 mg | ORAL_TABLET | Freq: Every day | ORAL | 3 refills | Status: DC
Start: 2021-09-01 — End: 2022-08-18

## 2021-09-01 MED ORDER — POTASSIUM CHLORIDE CRYS ER 20 MEQ PO TBCR: 40.0000 meq | EXTENDED_RELEASE_TABLET | Freq: Two times a day (BID) | ORAL | 3 refills | Status: DC

## 2021-09-01 NOTE — Telephone Encounter (Signed)
-----   Message from Jacklynn Ganong, [place] sent at 08/31/2021  4:44 PM EDT ----- K is too low. Is she taking her K suppl? Needs to increase to 40 mEq BID. Repeat BMET in 1 week please

## 2021-09-01 NOTE — Telephone Encounter (Signed)
Patient advised and verbalized understanding,patient has lab order for repeat blood work, med list updated to reflect changes  Meds ordered this encounter  Medications   potassium chloride SA (KLOR-CON) 20 MEQ tablet    Sig: Take 2 tablets (40 mEq total) by mouth 2 (two) times daily.    Dispense:  360 tablet    Refill:  3    Please cancel all previous orders for current medication. Change in dosage or pill size.   empagliflozin (JARDIANCE) 10 MG TABS tablet    Sig: Take 1 tablet (10 mg total) by mouth daily before breakfast.    Dispense:  90 tablet    Refill:  3    Member ID: [id number] Group ID: [id number] RxBin ID: [id number] PCN: [id number] Eligibility Start Date: 02/12/2021 Eligibility End Date: 05/12/2022 Assistance Amount: $1,000.00 D/c script for 30 day supply

## 2021-09-03 DIAGNOSIS — R519 Headache, unspecified: Secondary | ICD-10-CM | POA: Diagnosis not present

## 2021-09-03 DIAGNOSIS — R778 Other specified abnormalities of plasma proteins: Secondary | ICD-10-CM | POA: Diagnosis not present

## 2021-09-03 DIAGNOSIS — I10 Essential (primary) hypertension: Secondary | ICD-10-CM | POA: Diagnosis not present

## 2021-09-03 DIAGNOSIS — E119 Type 2 diabetes mellitus without complications: Secondary | ICD-10-CM | POA: Diagnosis not present

## 2021-09-04 ENCOUNTER — Other Ambulatory Visit: Payer: Self-pay | Admitting: Family Medicine

## 2021-09-07 DIAGNOSIS — J4531 Mild persistent asthma with (acute) exacerbation: Secondary | ICD-10-CM | POA: Diagnosis not present

## 2021-09-13 ENCOUNTER — Other Ambulatory Visit (HOSPITAL_COMMUNITY): Payer: Medicare Other

## 2021-09-13 DIAGNOSIS — B394 Histoplasmosis capsulati, unspecified: Secondary | ICD-10-CM | POA: Diagnosis not present

## 2021-09-13 DIAGNOSIS — I32 Pericarditis in diseases classified elsewhere: Secondary | ICD-10-CM | POA: Diagnosis not present

## 2021-09-27 DIAGNOSIS — J4531 Mild persistent asthma with (acute) exacerbation: Secondary | ICD-10-CM | POA: Diagnosis not present

## 2021-09-27 DIAGNOSIS — M5442 Lumbago with sciatica, left side: Secondary | ICD-10-CM | POA: Diagnosis not present

## 2021-09-28 DIAGNOSIS — I10 Essential (primary) hypertension: Secondary | ICD-10-CM | POA: Diagnosis not present

## 2021-09-28 DIAGNOSIS — R519 Headache, unspecified: Secondary | ICD-10-CM | POA: Diagnosis not present

## 2021-09-28 DIAGNOSIS — E119 Type 2 diabetes mellitus without complications: Secondary | ICD-10-CM | POA: Diagnosis not present

## 2021-09-28 DIAGNOSIS — R778 Other specified abnormalities of plasma proteins: Secondary | ICD-10-CM | POA: Diagnosis not present

## 2021-09-29 NOTE — Progress Notes (Signed)
PCP: Neale Burly, MD Cardiology: Dr. Harl Bowie HF Cardiology: Dr. Aundra Dubin  Reason for Visit: f/u for chronic diastolic heart failure   75 y.o. with history of CAD, diastolic CHF, and poorly controlled HTN was referred by Dr. Harl Bowie for CHF evaluation. Patient had CABG in 2008.  She has poorly controlled HTN and CKD stage 3.  She was admitted in 10/21 with hypertensive urgency and CHF due to medication noncompliance.  She was admitted again in 3/22 with acute diastolic CHF, AKI to 2.9. HS-TnI was 1777 but cath was not done due to AKI.  Echo showed EF 60-65% with normal RV.  She was again admitted (this time to [facility]) in 5/22 with CHF.  No med changes were made.  Given multiple admission with CHF and difficulty managing volume, she was referred to CHF clinic.  Torsemide was increased and Jardiance was started.   LHC/RHC in 6/22 showed mildly elevated RA pressure, normal PCWP.  LIMA-LAD and SVG-PDA were patent, sequential SVG-OM and ramus only touched down on OM.  Native RCA has a heavily calcified ostium with 95% stenosis, very difficult to seat the catheter due to calcification.  The native PDA is occluded at the ostium, and SVG-PDA only fills the PDA (does not back-fill to PLV and RCA).  Therefore, PLV territory is likely jeopardized by the ostial RCA stenosis. I reviewed the films with Dr. Martinique.  Potentially could intervene on ostial RCA using atherectomy (would be difficult).  As patient's primary symptoms are CHF-related and this is improved with diuresis, will manage medically now with option for PCI if she develops chest pain, etc.  Volume overloaded at AHF follow up visits and her torsemide has been increased.  S/p CardioMems 06/23/21.  Post Cardiomems follow up she was volume overloaded and we increased her torsemide for 3 days.  Last clinic visit, 08/31/21, she was fluid overloaded. Wt was up and dPAP reading on cardiomems was also slightly above goal, in the setting of  running out of her Jardiance. She was restarted on Jardiance 10 mg and instructed to increase torsemide to 80 mg bid x 3 days then back to 60 mg bid. K was also low, KCl increased.   She returns back to clinic today for f/u. Volume status improved. Her wt is down 6 lb from 206>>200 lb. dPAP reading today was 11 (goal 18). BP well controlled. She feels much better. Breathing much improved, able to do her ADLs w/o much limitation. Denies CP.    Cardiomems (goal 18): today's readings 11.  Labs (5/22): Pro-BNP 2347, K 4.1, creatinine 1.46 Labs (6/22): K 3.9, creatinine 1.75 Labs (7/22): K 3.5, creatinine 1.62 Labs (9/22): K 4.2, creatinine 1.52 Labs (10/22): K 2.8, creatinine 1.36   PMH: 1. Hyperlipidemia 2. HTN 3. CAD: CABG 2008 with LIMA-LAD, SVG-ramus, SVG-OM, SVG-PDA.  - Cardiolite (11/21): Small reversible inferolateral defect.  - LHC (6/22): LIMA-LAD and SVG-PDA were patent, sequential SVG-OM and ramus only touched down on OM.  Native RCA has a heavily calcified ostium with 95% stenosis, very difficult to seat the catheter due to calcification.  The native PDA is occluded at the ostium, and SVG-PDA only fills the PDA (does not back-fill to PLV and RCA).  Therefore, PLV territory is likely jeopardized by the ostial RCA stenosis. I reviewed the films with Dr. Martinique.  Potentially could intervene on ostial RCA using atherectomy (would be difficult).  As patient's primary symptoms are CHF-related and this is improved with diuresis, will manage  medically now with option for PCI if she develops chest pain, etc. 4. Chronic diastolic CHF: Echo (3/22) with EF 60-65%, normal RV.  - Multiple admission with CHF.  - RHC (6/22): mean RA 10, PA 42/15 mean 25, mean PCWP 13, CI 3.72, PVR 1.7 WU.  5. CKD: Stage 3.   SH: Lives in [place], retired from [employer], lives alone, has a son in [place].  Quit smoking in 2003, no ETOH.   Family History  Problem Relation Age of Onset   Cancer  Other        Family Hx of Cancer   Coronary artery disease Other        Family Hx of CAD   ROS: All systems reviewed and negative except as per HPI.   Current Outpatient Medications  Medication Sig Dispense Refill   acetaminophen (TYLENOL) 500 MG tablet Take 500-1,000 mg by mouth every 6 (six) hours as needed (pain).     ALPRAZolam (XANAX) 0.5 MG tablet Take 0.5 mg by mouth at bedtime as needed for anxiety or sleep.     aspirin EC 81 MG tablet Take 81 mg by mouth every evening. Swallow whole.     atorvastatin (LIPITOR) 40 MG tablet Take 40 mg by mouth every evening.  0   Calcium Carb-Cholecalciferol (CALCIUM 500 + D) 500-125 MG-UNIT TABS Take by mouth. Patient takes 1 daily.     Cholecalciferol (VITAMIN D) 125 MCG (5000 UT) CAPS Take 5,000 Units by mouth daily with lunch.     empagliflozin (JARDIANCE) 10 MG TABS tablet Take 1 tablet (10 mg total) by mouth daily before breakfast. 90 tablet 3   HUMALOG 100 UNIT/ML injection Inject 0.75 Units into the skin with breakfast, with lunch, and with evening meal. With insulin pump 24 hours adjust sometime when eat     hydrALAZINE (APRESOLINE) 50 MG tablet Taking one tablet by mouth three times daily     ibandronate (BONIVA) 150 MG tablet Take 150 mg by mouth every 30 (thirty) days.     Insulin Disposable Pump (OMNIPOD DASH PODS, GEN 4,) MISC Take by mouth every 3 (three) days.     Insulin Pen Needle (COMFORT TOUCH INSULIN PEN NEED) 31G X 5 MM MISC 1 each by Does not apply route in the morning, at noon, in the evening, and at bedtime. 100 each 0   ipratropium (ATROVENT) 0.03 % nasal spray Place 1 spray into both nostrils 4 (four) times daily as needed (allergies).     isosorbide dinitrate (ISORDIL) 10 MG tablet Take 10 mg by mouth 3 (three) times daily.     levocetirizine (XYZAL) 5 MG tablet Take 5 mg by mouth at bedtime.     loratadine (CLARITIN) 10 MG tablet Take 10 mg by mouth daily as needed for allergies.     metoprolol tartrate (LOPRESSOR) 25 MG  tablet Take 1/2 (one-half) tablet by mouth twice daily 30 tablet 0   nitroGLYCERIN (NITROSTAT) 0.4 MG SL tablet DISSOLVE ONE TABLET UNDER THE TONGUE EVERY 5 MINUTES AS NEEDED FOR CHEST PAIN.  DO NOT EXCEED A TOTAL OF 3 DOSES IN 15 MINUTES 25 tablet 3   omega-3 fish oil (MAXEPA) 1000 MG CAPS capsule Take 1,000 mg by mouth in the morning.     omeprazole (PRILOSEC) 20 MG capsule Take 20 mg by mouth in the morning.     ondansetron (ZOFRAN) 4 MG tablet Take 4 mg by mouth every 6 (six) hours as needed for nausea.  ONETOUCH VERIO test strip      OXYGEN Inhale 2 L into the lungs daily as needed (Active).     PE-Triprolidine-DM-GG-APAP (MUCINEX CNG/CGH/COLD/FLU DY/NT PO) Take by mouth. Patient takes 2 tablets every 4 hours as needed.     potassium chloride SA (KLOR-CON) 20 MEQ tablet Take 2 tablets (40 mEq total) by mouth 2 (two) times daily. 360 tablet 3   promethazine (PHENERGAN) 25 MG tablet Take 12.5 mg by mouth every 6 (six) hours as needed for vomiting or nausea.     torsemide (DEMADEX) 20 MG tablet Take 60 mg by mouth 2 (two) times daily.     traMADol (ULTRAM) 50 MG tablet Take 50 mg by mouth 3 (three) times daily as needed (arthritis pain).     TRELEGY ELLIPTA 200-62.5-25 MCG/ACT AEPB Inhale 1 puff into the lungs daily.     vitamin C (ASCORBIC ACID) 500 MG tablet Take 500 mg by mouth in the morning.     No current facility-administered medications for this encounter.   BP (!) 130/54   Pulse 63   Wt 91 kg (200 lb 9.6 oz)   SpO2 93%   BMI 39.18 kg/m   Wt Readings from Last 3 Encounters:  10/03/21 91 kg (200 lb 9.6 oz)  08/31/21 93.6 kg (206 lb 4.8 oz)  07/25/21 91.9 kg (202 lb 9.6 oz)   PHYSICAL EXAM: General:  Well appearing. No respiratory difficulty HEENT: normal Neck: supple. no JVD. Carotids 2+ bilat; no bruits. No lymphadenopathy or thyromegaly appreciated. Cor: PMI nondisplaced. Regular rate & rhythm. No rubs, gallops or murmurs. Lungs: clear Abdomen: soft, nontender,  nondistended. No hepatosplenomegaly. No bruits or masses. Good bowel sounds. Extremities: no cyanosis, clubbing, rash, edema Neuro: alert & oriented x 3, cranial nerves grossly intact. moves all 4 extremities w/o difficulty. Affect pleasant.   Assessment/Plan: 1. Chronic Diastolic CHF: Echo in [id number] with EF 60-65%, normal RV.  She has had 3 admissions in the last year with CHF.  She does not have LVH on echo so suspect cardiac amyloidosis is unlikely.  Chronically NYHA class II-III, stable. Euvolemic on exam and by today's CardioMEMs reading.   - Continue Jardiance 10 mg daily  - Continue torsemide 60 mg bid.  - Encouraged to continues w/ daily CardioMems transmission  2. HTN: controlled on current regimen  - Continue hydralazine 25 mg tid + isordil 10 mg tid. 3. CAD: S/p CABG in 2008.  HS-TnI up to 1777 during 3/22 admission but no cath due to AKI.  She has prominent exertional dyspnea but only atypical chest pain.  LHC in 6/22 showed that LIMA-LAD and SVG-PDA were patent, sequential SVG-OM and ramus only touched down on OM.  Native RCA has a heavily calcified ostium with 95% stenosis, very difficult to seat the catheter due to calcification.  The native PDA is occluded at the ostium, and SVG-PDA only fills the PDA (does not back-fill to PLV and RCA).  Therefore, PLV territory is likely jeopardized by the ostial RCA stenosis. I reviewed the films with Dr. Martinique.  Potentially could intervene on ostial RCA using atherectomy (would be difficult).  As patient's primary symptoms are CHF-related and this is improved with diuresis, will manage medically now with option for PCI if she develops chest pain, etc. She remains CP free  - Continue ASA 81 daily.  - Continue atorvastatin 40 mg daily.  - Continue metoprolol 12.5 bid.  4. Suspect OSA: She says that she would not wear CPAP, so sleep study  not ordered.  5. CKD stage 3: Last creatinine 1.36.  BMET today.   F/u w/ Dr. Aundra Dubin in 3 months   Lyda Jester, PA-C  10/03/2021

## 2021-10-03 ENCOUNTER — Encounter (HOSPITAL_COMMUNITY): Payer: Self-pay

## 2021-10-03 ENCOUNTER — Other Ambulatory Visit: Payer: Self-pay

## 2021-10-03 ENCOUNTER — Ambulatory Visit (HOSPITAL_COMMUNITY)
Admission: RE | Admit: 2021-10-03 | Discharge: 2021-10-03 | Disposition: A | Discharge status: Home / Self Care | Payer: Medicare Other | Source: Ambulatory Visit | Attending: Family Medicine | Admitting: Family Medicine

## 2021-10-03 VITALS — BP 130/54 | HR 63 | Wt 200.6 lb

## 2021-10-03 DIAGNOSIS — Z09 Encounter for follow-up examination after completed treatment for conditions other than malignant neoplasm: Secondary | ICD-10-CM | POA: Insufficient documentation

## 2021-10-03 DIAGNOSIS — N183 Chronic kidney disease, stage 3 unspecified: Secondary | ICD-10-CM | POA: Diagnosis not present

## 2021-10-03 DIAGNOSIS — I5033 Acute on chronic diastolic (congestive) heart failure: Secondary | ICD-10-CM

## 2021-10-03 DIAGNOSIS — Z7982 Long term (current) use of aspirin: Secondary | ICD-10-CM | POA: Insufficient documentation

## 2021-10-03 DIAGNOSIS — I2510 Atherosclerotic heart disease of native coronary artery without angina pectoris: Secondary | ICD-10-CM | POA: Diagnosis not present

## 2021-10-03 DIAGNOSIS — R0789 Other chest pain: Secondary | ICD-10-CM | POA: Insufficient documentation

## 2021-10-03 DIAGNOSIS — Z951 Presence of aortocoronary bypass graft: Secondary | ICD-10-CM | POA: Diagnosis not present

## 2021-10-03 DIAGNOSIS — Z79899 Other long term (current) drug therapy: Secondary | ICD-10-CM | POA: Diagnosis not present

## 2021-10-03 DIAGNOSIS — I130 Hypertensive heart and chronic kidney disease with heart failure and stage 1 through stage 4 chronic kidney disease, or unspecified chronic kidney disease: Secondary | ICD-10-CM | POA: Insufficient documentation

## 2021-10-03 DIAGNOSIS — Z87891 Personal history of nicotine dependence: Secondary | ICD-10-CM | POA: Diagnosis not present

## 2021-10-03 DIAGNOSIS — R0609 Other forms of dyspnea: Secondary | ICD-10-CM | POA: Diagnosis not present

## 2021-10-03 DIAGNOSIS — I5032 Chronic diastolic (congestive) heart failure: Secondary | ICD-10-CM | POA: Insufficient documentation

## 2021-10-03 DIAGNOSIS — Z7984 Long term (current) use of oral hypoglycemic drugs: Secondary | ICD-10-CM | POA: Diagnosis not present

## 2021-10-03 LAB — BASIC METABOLIC PANEL
Anion gap: 9 (ref 5–15)
BUN: 15 mg/dL (ref 8–23)
CO2: 31 mmol/L (ref 22–32)
Calcium: 8.9 mg/dL (ref 8.9–10.3)
Chloride: 99 mmol/L (ref 98–111)
Creatinine, Ser: 1.5 mg/dL — ABNORMAL HIGH (ref 0.44–1.00)
GFR, Estimated: 37 mL/min — ABNORMAL LOW (ref >60)
Glucose, Bld: 48 mg/dL — ABNORMAL LOW (ref 70–99)
Potassium: 4.3 mmol/L (ref 3.5–5.1)
Sodium: 139 mmol/L (ref 135–145)

## 2021-10-03 NOTE — Patient Instructions (Signed)
Labs were done today, if any labs are abnormal the clinic will call you  Your physician recommends that you schedule a follow-up appointment in: 3 months  At the [facility], you and your health needs are our priority. As part of our continuing mission to provide you with exceptional heart care, we have created designated Provider Care Teams. These Care Teams include your primary Cardiologist (physician) and Advanced Practice Providers (APPs- Physician Assistants and Nurse Practitioners) who all work together to provide you with the care you need, when you need it.   You may see any of the following providers on your designated Care Team at your next follow up: Dr Arvilla Meres Dr Carron Curie, NP Robbie Lis, [place] [facility], [place] Karle Plumber, PharmD   Please be sure to bring in all your medications bottles to every appointment.   If you have any questions or concerns before your next appointment please send [place] a message through [place] or call our office at [phone].    TO LEAVE A MESSAGE FOR THE NURSE SELECT OPTION 2, PLEASE LEAVE A MESSAGE INCLUDING: YOUR NAME DATE OF BIRTH CALL BACK NUMBER REASON FOR CALL**this is important as we prioritize the call backs  YOU WILL RECEIVE A CALL BACK THE SAME DAY AS LONG AS YOU CALL BEFORE 4:00 PM

## 2021-10-04 DIAGNOSIS — E119 Type 2 diabetes mellitus without complications: Secondary | ICD-10-CM | POA: Diagnosis not present

## 2021-10-04 DIAGNOSIS — R778 Other specified abnormalities of plasma proteins: Secondary | ICD-10-CM | POA: Diagnosis not present

## 2021-10-04 DIAGNOSIS — R519 Headache, unspecified: Secondary | ICD-10-CM | POA: Diagnosis not present

## 2021-10-04 DIAGNOSIS — I10 Essential (primary) hypertension: Secondary | ICD-10-CM | POA: Diagnosis not present

## 2021-10-28 DIAGNOSIS — I10 Essential (primary) hypertension: Secondary | ICD-10-CM | POA: Diagnosis not present

## 2021-10-28 DIAGNOSIS — R778 Other specified abnormalities of plasma proteins: Secondary | ICD-10-CM | POA: Diagnosis not present

## 2021-10-28 DIAGNOSIS — E119 Type 2 diabetes mellitus without complications: Secondary | ICD-10-CM | POA: Diagnosis not present

## 2021-10-28 DIAGNOSIS — R519 Headache, unspecified: Secondary | ICD-10-CM | POA: Diagnosis not present

## 2021-11-03 DIAGNOSIS — R519 Headache, unspecified: Secondary | ICD-10-CM | POA: Diagnosis not present

## 2021-11-03 DIAGNOSIS — R778 Other specified abnormalities of plasma proteins: Secondary | ICD-10-CM | POA: Diagnosis not present

## 2021-11-03 DIAGNOSIS — E119 Type 2 diabetes mellitus without complications: Secondary | ICD-10-CM | POA: Diagnosis not present

## 2021-11-03 DIAGNOSIS — I10 Essential (primary) hypertension: Secondary | ICD-10-CM | POA: Diagnosis not present

## 2021-11-16 ENCOUNTER — Other Ambulatory Visit (HOSPITAL_COMMUNITY): Payer: Self-pay | Admitting: Family Medicine

## 2021-11-23 ENCOUNTER — Other Ambulatory Visit (HOSPITAL_COMMUNITY): Payer: Self-pay | Admitting: *Deleted

## 2021-11-23 MED ORDER — TORSEMIDE 20 MG PO TABS: 60.0000 mg | ORAL_TABLET | Freq: Two times a day (BID) | ORAL | 6 refills | Status: DC

## 2021-11-28 DIAGNOSIS — I10 Essential (primary) hypertension: Secondary | ICD-10-CM | POA: Diagnosis not present

## 2021-11-28 DIAGNOSIS — R778 Other specified abnormalities of plasma proteins: Secondary | ICD-10-CM | POA: Diagnosis not present

## 2021-11-28 DIAGNOSIS — E119 Type 2 diabetes mellitus without complications: Secondary | ICD-10-CM | POA: Diagnosis not present

## 2021-11-28 DIAGNOSIS — R519 Headache, unspecified: Secondary | ICD-10-CM | POA: Diagnosis not present

## 2021-11-30 ENCOUNTER — Other Ambulatory Visit: Payer: Self-pay

## 2021-11-30 ENCOUNTER — Ambulatory Visit (HOSPITAL_COMMUNITY)
Admission: RE | Admit: 2021-11-30 | Discharge: 2021-11-30 | Disposition: A | Discharge status: Home / Self Care | Payer: Medicare Other | Source: Ambulatory Visit | Attending: Adult Health | Admitting: Adult Health

## 2021-11-30 DIAGNOSIS — I5032 Chronic diastolic (congestive) heart failure: Secondary | ICD-10-CM | POA: Diagnosis not present

## 2021-11-30 NOTE — Progress Notes (Signed)
°  Cardiomems Remote Monitoring  S/P Cardiomems Implant 06/23/21  PAD Goal: 18 Most recent reading: 18  Recommended changes: None   I continue to review and analyze the patients PA pressures weekly (and more often as needed) to bring PA pressures within the optimal range.    Jeferson Boozer NP-C  12:29 PM

## 2021-12-04 DIAGNOSIS — R778 Other specified abnormalities of plasma proteins: Secondary | ICD-10-CM | POA: Diagnosis not present

## 2021-12-04 DIAGNOSIS — R519 Headache, unspecified: Secondary | ICD-10-CM | POA: Diagnosis not present

## 2021-12-04 DIAGNOSIS — E119 Type 2 diabetes mellitus without complications: Secondary | ICD-10-CM | POA: Diagnosis not present

## 2021-12-04 DIAGNOSIS — I10 Essential (primary) hypertension: Secondary | ICD-10-CM | POA: Diagnosis not present

## 2021-12-12 ENCOUNTER — Encounter (HOSPITAL_COMMUNITY): Payer: Self-pay | Admitting: Cardiology

## 2021-12-28 DIAGNOSIS — E1121 Type 2 diabetes mellitus with diabetic nephropathy: Secondary | ICD-10-CM | POA: Diagnosis not present

## 2021-12-28 DIAGNOSIS — K219 Gastro-esophageal reflux disease without esophagitis: Secondary | ICD-10-CM | POA: Diagnosis not present

## 2021-12-28 DIAGNOSIS — J4531 Mild persistent asthma with (acute) exacerbation: Secondary | ICD-10-CM | POA: Diagnosis not present

## 2021-12-28 DIAGNOSIS — Z0000 Encounter for general adult medical examination without abnormal findings: Secondary | ICD-10-CM | POA: Diagnosis not present

## 2021-12-28 DIAGNOSIS — M5442 Lumbago with sciatica, left side: Secondary | ICD-10-CM | POA: Diagnosis not present

## 2021-12-29 DIAGNOSIS — I10 Essential (primary) hypertension: Secondary | ICD-10-CM | POA: Diagnosis not present

## 2021-12-29 DIAGNOSIS — R778 Other specified abnormalities of plasma proteins: Secondary | ICD-10-CM | POA: Diagnosis not present

## 2021-12-29 DIAGNOSIS — E119 Type 2 diabetes mellitus without complications: Secondary | ICD-10-CM | POA: Diagnosis not present

## 2021-12-29 DIAGNOSIS — R519 Headache, unspecified: Secondary | ICD-10-CM | POA: Diagnosis not present

## 2022-01-03 ENCOUNTER — Encounter (HOSPITAL_COMMUNITY): Payer: Medicare Other | Admitting: Cardiology

## 2022-01-04 DIAGNOSIS — E119 Type 2 diabetes mellitus without complications: Secondary | ICD-10-CM | POA: Diagnosis not present

## 2022-01-04 DIAGNOSIS — R519 Headache, unspecified: Secondary | ICD-10-CM | POA: Diagnosis not present

## 2022-01-04 DIAGNOSIS — I10 Essential (primary) hypertension: Secondary | ICD-10-CM | POA: Diagnosis not present

## 2022-01-04 DIAGNOSIS — R778 Other specified abnormalities of plasma proteins: Secondary | ICD-10-CM | POA: Diagnosis not present

## 2022-01-06 ENCOUNTER — Encounter (HOSPITAL_COMMUNITY): Payer: Medicare Other | Admitting: Cardiology

## 2022-01-09 DIAGNOSIS — E119 Type 2 diabetes mellitus without complications: Secondary | ICD-10-CM | POA: Diagnosis not present

## 2022-01-09 DIAGNOSIS — R778 Other specified abnormalities of plasma proteins: Secondary | ICD-10-CM | POA: Diagnosis not present

## 2022-01-09 DIAGNOSIS — R519 Headache, unspecified: Secondary | ICD-10-CM | POA: Diagnosis not present

## 2022-01-09 DIAGNOSIS — I10 Essential (primary) hypertension: Secondary | ICD-10-CM | POA: Diagnosis not present

## 2022-01-24 ENCOUNTER — Telehealth: Payer: Self-pay | Admitting: Physician Assistant

## 2022-01-24 DIAGNOSIS — K219 Gastro-esophageal reflux disease without esophagitis: Secondary | ICD-10-CM | POA: Diagnosis not present

## 2022-01-24 DIAGNOSIS — J4531 Mild persistent asthma with (acute) exacerbation: Secondary | ICD-10-CM | POA: Diagnosis not present

## 2022-01-24 DIAGNOSIS — I10 Essential (primary) hypertension: Secondary | ICD-10-CM | POA: Diagnosis not present

## 2022-01-24 DIAGNOSIS — E1121 Type 2 diabetes mellitus with diabetic nephropathy: Secondary | ICD-10-CM | POA: Diagnosis not present

## 2022-01-24 NOTE — Telephone Encounter (Signed)
Pt called in stating she has had a swollen foot for over a week and she can't get it to go down. She generally takes 3 torsemide BID, but takes 4 tablets at times for swelling.   She has not increased torsemide yet. She reports this is the same pain she's had in the past, no SOB or chest pain.   She does have a cardiomems in place and she has sent a transmission this morning.   I advised to take extra torsemide today and I will notify Dr. Aundra Dubin.   AHF - can you please take a look at her cardiomems and make recommendations for length of time for increased diuretic? I let her know someone would reach out to her today.

## 2022-01-24 NOTE — Telephone Encounter (Signed)
Cardiomems reading looks good, no change from plan.

## 2022-01-24 NOTE — Telephone Encounter (Addendum)
Cardiomems has been in range, spoke w/pt, she states edema is a little better this afternoon. She states the pain is gone, there is no redness or warmth. Pt does not elevate legs during the day, they usually are dangling. Advised to keep them elevated, can take extra Torsemide tomorrow as well, if not improved to let us know, she is aware, agreeable, and verbalized understanding

## 2022-01-24 NOTE — Telephone Encounter (Signed)
Reading today 16 goal 18

## 2022-01-26 DIAGNOSIS — E119 Type 2 diabetes mellitus without complications: Secondary | ICD-10-CM | POA: Diagnosis not present

## 2022-01-26 DIAGNOSIS — I10 Essential (primary) hypertension: Secondary | ICD-10-CM | POA: Diagnosis not present

## 2022-01-26 DIAGNOSIS — R778 Other specified abnormalities of plasma proteins: Secondary | ICD-10-CM | POA: Diagnosis not present

## 2022-01-26 DIAGNOSIS — R519 Headache, unspecified: Secondary | ICD-10-CM | POA: Diagnosis not present

## 2022-01-30 DIAGNOSIS — Z794 Long term (current) use of insulin: Secondary | ICD-10-CM | POA: Diagnosis not present

## 2022-01-30 DIAGNOSIS — H40011 Open angle with borderline findings, low risk, right eye: Secondary | ICD-10-CM | POA: Diagnosis not present

## 2022-01-30 DIAGNOSIS — Z961 Presence of intraocular lens: Secondary | ICD-10-CM | POA: Diagnosis not present

## 2022-01-30 DIAGNOSIS — H26493 Other secondary cataract, bilateral: Secondary | ICD-10-CM | POA: Diagnosis not present

## 2022-01-30 DIAGNOSIS — H16229 Keratoconjunctivitis sicca, not specified as Sjogren's, unspecified eye: Secondary | ICD-10-CM | POA: Diagnosis not present

## 2022-01-30 DIAGNOSIS — E109 Type 1 diabetes mellitus without complications: Secondary | ICD-10-CM | POA: Diagnosis not present

## 2022-01-30 DIAGNOSIS — H524 Presbyopia: Secondary | ICD-10-CM | POA: Diagnosis not present

## 2022-02-01 DIAGNOSIS — R519 Headache, unspecified: Secondary | ICD-10-CM | POA: Diagnosis not present

## 2022-02-01 DIAGNOSIS — R778 Other specified abnormalities of plasma proteins: Secondary | ICD-10-CM | POA: Diagnosis not present

## 2022-02-01 DIAGNOSIS — I10 Essential (primary) hypertension: Secondary | ICD-10-CM | POA: Diagnosis not present

## 2022-02-11 IMAGING — DX DG CHEST 1V PORT
1 series · 1 of 1 positions shown · non-contrast
Comparison: 06/21/2007.

CLINICAL DATA: Shortness of breath.

EXAM:
PORTABLE CHEST 1 VIEW

[chest]
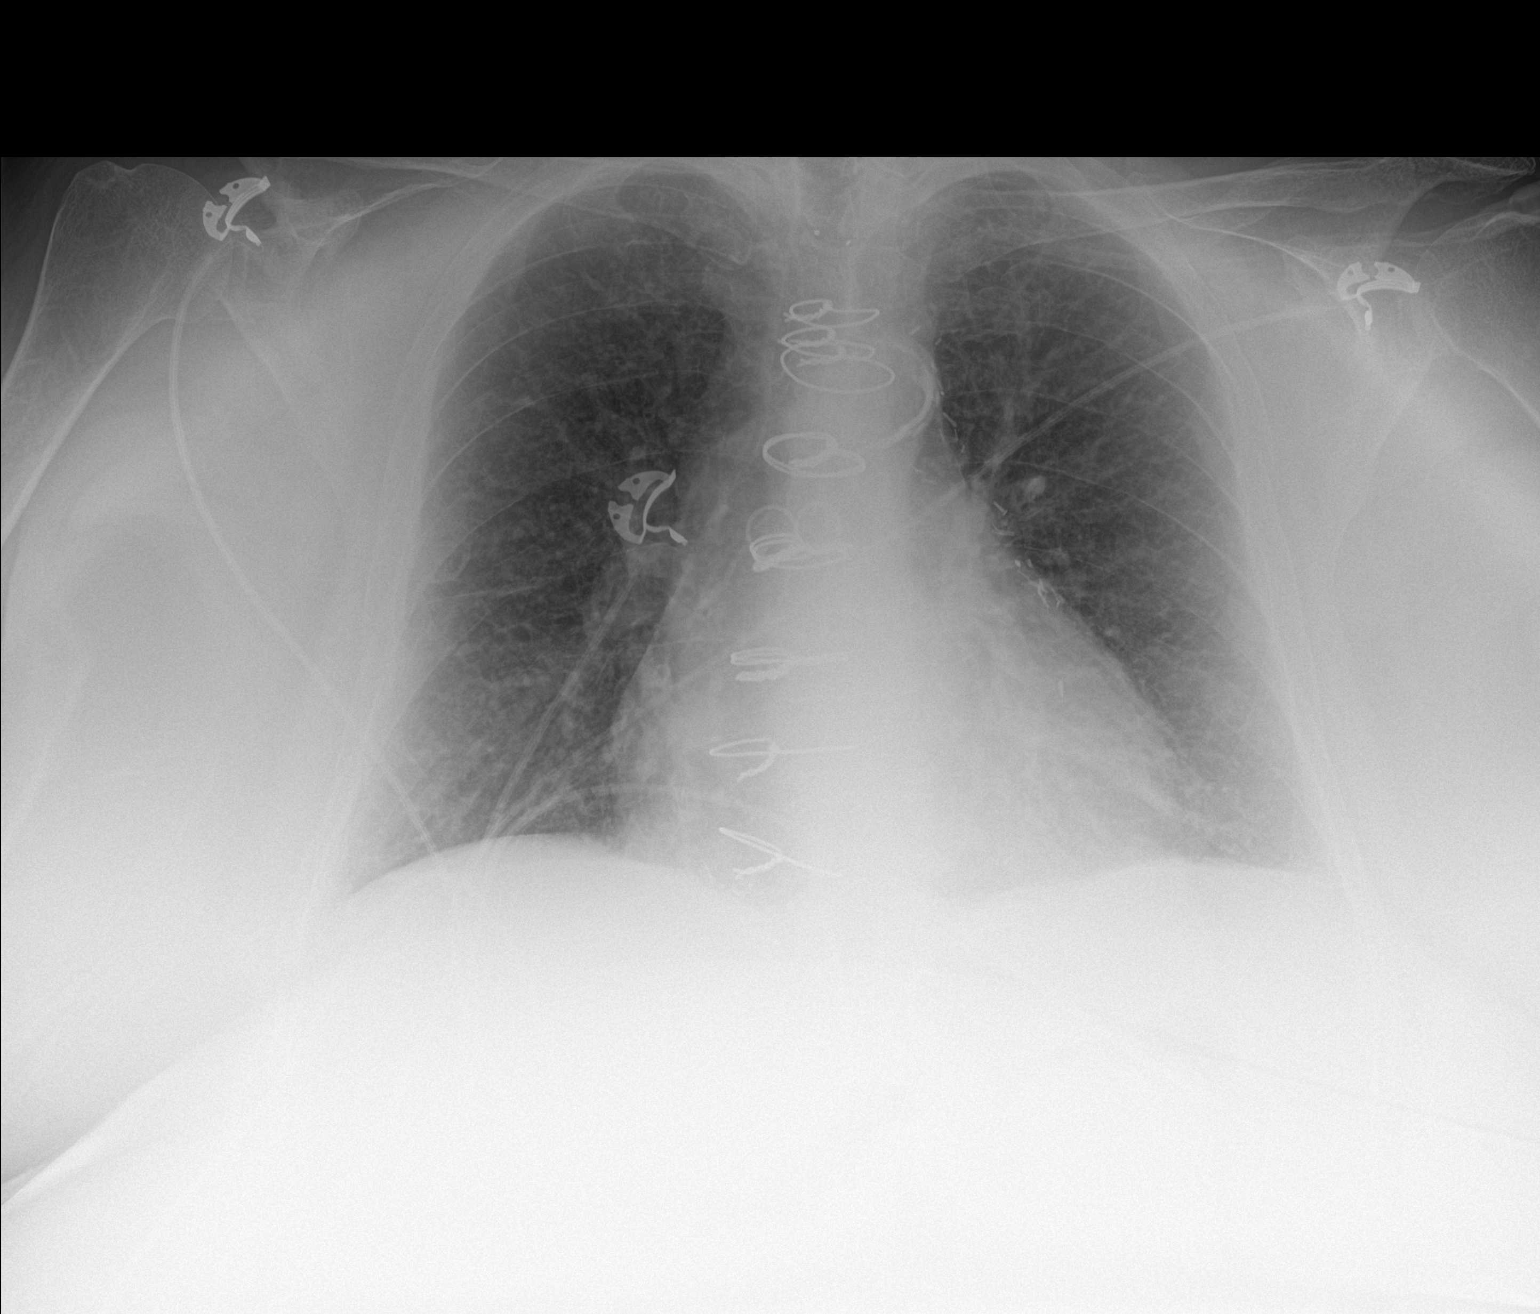

[1 of 1 positions shown; findings below may reference images not displayed]

FINDINGS: Prior CABG. Heart size normal. Low lung volumes. No focal
infiltrate. No pleural effusion or pneumothorax. No acute bony
abnormality.
IMPRESSION: Prior CABG. Low lung volumes. No acute cardiopulmonary disease. Exam
stable from prior exam.

## 2022-02-16 ENCOUNTER — Encounter (HOSPITAL_COMMUNITY): Payer: Self-pay

## 2022-02-16 ENCOUNTER — Telehealth (HOSPITAL_COMMUNITY): Payer: Self-pay

## 2022-02-16 NOTE — Telephone Encounter (Signed)
Got [name] to check her grant and she said that grant was renewed and to get her to call the pharmacy again and check. Spoke with patient and informed her of grant renewal ?

## 2022-02-17 ENCOUNTER — Other Ambulatory Visit (HOSPITAL_COMMUNITY): Payer: Self-pay

## 2022-02-24 DIAGNOSIS — E1121 Type 2 diabetes mellitus with diabetic nephropathy: Secondary | ICD-10-CM | POA: Diagnosis not present

## 2022-02-24 DIAGNOSIS — M5442 Lumbago with sciatica, left side: Secondary | ICD-10-CM | POA: Diagnosis not present

## 2022-02-24 DIAGNOSIS — J4531 Mild persistent asthma with (acute) exacerbation: Secondary | ICD-10-CM | POA: Diagnosis not present

## 2022-02-24 DIAGNOSIS — Z0000 Encounter for general adult medical examination without abnormal findings: Secondary | ICD-10-CM | POA: Diagnosis not present

## 2022-02-24 DIAGNOSIS — I10 Essential (primary) hypertension: Secondary | ICD-10-CM | POA: Diagnosis not present

## 2022-02-24 DIAGNOSIS — K219 Gastro-esophageal reflux disease without esophagitis: Secondary | ICD-10-CM | POA: Diagnosis not present

## 2022-02-26 DIAGNOSIS — R519 Headache, unspecified: Secondary | ICD-10-CM | POA: Diagnosis not present

## 2022-02-26 DIAGNOSIS — I10 Essential (primary) hypertension: Secondary | ICD-10-CM | POA: Diagnosis not present

## 2022-02-26 DIAGNOSIS — R778 Other specified abnormalities of plasma proteins: Secondary | ICD-10-CM | POA: Diagnosis not present

## 2022-02-26 DIAGNOSIS — E119 Type 2 diabetes mellitus without complications: Secondary | ICD-10-CM | POA: Diagnosis not present

## 2022-03-04 DIAGNOSIS — I10 Essential (primary) hypertension: Secondary | ICD-10-CM | POA: Diagnosis not present

## 2022-03-04 DIAGNOSIS — R519 Headache, unspecified: Secondary | ICD-10-CM | POA: Diagnosis not present

## 2022-03-04 DIAGNOSIS — R778 Other specified abnormalities of plasma proteins: Secondary | ICD-10-CM | POA: Diagnosis not present

## 2022-03-27 DIAGNOSIS — I10 Essential (primary) hypertension: Secondary | ICD-10-CM | POA: Diagnosis not present

## 2022-03-27 DIAGNOSIS — J4531 Mild persistent asthma with (acute) exacerbation: Secondary | ICD-10-CM | POA: Diagnosis not present

## 2022-03-27 DIAGNOSIS — M5442 Lumbago with sciatica, left side: Secondary | ICD-10-CM | POA: Diagnosis not present

## 2022-03-27 DIAGNOSIS — Z0000 Encounter for general adult medical examination without abnormal findings: Secondary | ICD-10-CM | POA: Diagnosis not present

## 2022-03-28 DIAGNOSIS — E119 Type 2 diabetes mellitus without complications: Secondary | ICD-10-CM | POA: Diagnosis not present

## 2022-03-28 DIAGNOSIS — R778 Other specified abnormalities of plasma proteins: Secondary | ICD-10-CM | POA: Diagnosis not present

## 2022-03-28 DIAGNOSIS — R519 Headache, unspecified: Secondary | ICD-10-CM | POA: Diagnosis not present

## 2022-03-28 DIAGNOSIS — I10 Essential (primary) hypertension: Secondary | ICD-10-CM | POA: Diagnosis not present

## 2022-04-03 DIAGNOSIS — R778 Other specified abnormalities of plasma proteins: Secondary | ICD-10-CM | POA: Diagnosis not present

## 2022-04-03 DIAGNOSIS — I10 Essential (primary) hypertension: Secondary | ICD-10-CM | POA: Diagnosis not present

## 2022-04-03 DIAGNOSIS — R519 Headache, unspecified: Secondary | ICD-10-CM | POA: Diagnosis not present

## 2022-04-07 ENCOUNTER — Ambulatory Visit (HOSPITAL_COMMUNITY)
Admission: RE | Admit: 2022-04-07 | Discharge: 2022-04-07 | Disposition: A | Discharge status: Home / Self Care | Payer: Medicare Other | Source: Ambulatory Visit | Attending: Cardiology | Admitting: Cardiology

## 2022-04-07 ENCOUNTER — Encounter (HOSPITAL_COMMUNITY): Payer: Self-pay | Admitting: Cardiology

## 2022-04-07 VITALS — BP 130/70 | HR 60 | Wt 217.6 lb

## 2022-04-07 DIAGNOSIS — Z79899 Other long term (current) drug therapy: Secondary | ICD-10-CM | POA: Insufficient documentation

## 2022-04-07 DIAGNOSIS — E662 Morbid (severe) obesity with alveolar hypoventilation: Secondary | ICD-10-CM | POA: Insufficient documentation

## 2022-04-07 DIAGNOSIS — N179 Acute kidney failure, unspecified: Secondary | ICD-10-CM | POA: Insufficient documentation

## 2022-04-07 DIAGNOSIS — N183 Chronic kidney disease, stage 3 unspecified: Secondary | ICD-10-CM | POA: Insufficient documentation

## 2022-04-07 DIAGNOSIS — Z951 Presence of aortocoronary bypass graft: Secondary | ICD-10-CM | POA: Insufficient documentation

## 2022-04-07 DIAGNOSIS — Z7902 Long term (current) use of antithrombotics/antiplatelets: Secondary | ICD-10-CM | POA: Diagnosis not present

## 2022-04-07 DIAGNOSIS — I5033 Acute on chronic diastolic (congestive) heart failure: Secondary | ICD-10-CM

## 2022-04-07 DIAGNOSIS — I509 Heart failure, unspecified: Secondary | ICD-10-CM | POA: Diagnosis not present

## 2022-04-07 DIAGNOSIS — I2510 Atherosclerotic heart disease of native coronary artery without angina pectoris: Secondary | ICD-10-CM | POA: Insufficient documentation

## 2022-04-07 DIAGNOSIS — Z7984 Long term (current) use of oral hypoglycemic drugs: Secondary | ICD-10-CM | POA: Diagnosis not present

## 2022-04-07 DIAGNOSIS — Z7982 Long term (current) use of aspirin: Secondary | ICD-10-CM | POA: Diagnosis not present

## 2022-04-07 DIAGNOSIS — I130 Hypertensive heart and chronic kidney disease with heart failure and stage 1 through stage 4 chronic kidney disease, or unspecified chronic kidney disease: Secondary | ICD-10-CM | POA: Diagnosis not present

## 2022-04-07 DIAGNOSIS — I5032 Chronic diastolic (congestive) heart failure: Secondary | ICD-10-CM | POA: Diagnosis not present

## 2022-04-07 LAB — LIPID PANEL
Cholesterol: 122 mg/dL (ref 0–200)
HDL: 42 mg/dL (ref >40)
LDL Cholesterol: 59 mg/dL (ref 0–99)
Total CHOL/HDL Ratio: 2.9 RATIO
Triglycerides: 105 mg/dL (ref <150)
VLDL: 21 mg/dL (ref 0–40)

## 2022-04-07 LAB — BASIC METABOLIC PANEL
Anion gap: 8 (ref 5–15)
BUN: 12 mg/dL (ref 8–23)
CO2: 32 mmol/L (ref 22–32)
Calcium: 9 mg/dL (ref 8.9–10.3)
Chloride: 98 mmol/L (ref 98–111)
Creatinine, Ser: 1.6 mg/dL — ABNORMAL HIGH (ref 0.44–1.00)
GFR, Estimated: 34 mL/min — ABNORMAL LOW (ref >60)
Glucose, Bld: 97 mg/dL (ref 70–99)
Potassium: 4.9 mmol/L (ref 3.5–5.1)
Sodium: 138 mmol/L (ref 135–145)

## 2022-04-07 LAB — BRAIN NATRIURETIC PEPTIDE: B Natriuretic Peptide: 530.6 pg/mL — ABNORMAL HIGH (ref 0.0–100.0)

## 2022-04-07 MED ORDER — TORSEMIDE 20 MG PO TABS: 80.0000 mg | ORAL_TABLET | Freq: Two times a day (BID) | ORAL | 6 refills | Status: DC

## 2022-04-07 MED ORDER — METOLAZONE 2.5 MG PO TABS: ORAL_TABLET | ORAL | 0 refills | Status: DC

## 2022-04-07 MED ORDER — TORSEMIDE 40 MG PO TABS: 80.0000 mg | ORAL_TABLET | Freq: Two times a day (BID) | ORAL | 4 refills | Status: DC

## 2022-04-07 MED ORDER — SPIRONOLACTONE 25 MG PO TABS: 25.0000 mg | ORAL_TABLET | Freq: Every day | ORAL | 2 refills | Status: DC

## 2022-04-07 MED ORDER — TORSEMIDE 40 MG PO TABS: 60.0000 mg | ORAL_TABLET | Freq: Two times a day (BID) | ORAL | 4 refills | Status: DC

## 2022-04-07 NOTE — Patient Instructions (Signed)
INCREASE Torsemide to 80 mg Twice daily ? ?START Spironolactone 12.5 mg Daily ? ?TAKE Metolazone 2.5 mg x1 tomorrow am with Torsemide dose ? ?Take Extra Potassium 40 meq  tomorrow ? ?Labs done today, your results will be available in MyChart, we will contact you for abnormal readings. ? ?Follow up lab work in 7 days ? ?Your physician recommends that you schedule a follow-up appointment in: 10 days with [facility] clinic ? ?If you have any questions or concerns before your next appointment please send [place] a message through [place] or call our office at [phone].   ? ?TO LEAVE A MESSAGE FOR THE NURSE SELECT OPTION 2, PLEASE LEAVE A MESSAGE INCLUDING: ?YOUR NAME ?DATE OF BIRTH ?CALL BACK NUMBER ?REASON FOR CALL**this is important as we prioritize the call backs ? ?YOU WILL RECEIVE A CALL BACK THE SAME DAY AS LONG AS YOU CALL BEFORE 4:00 PM ? ?At the [facility], you and your health needs are our priority. As part of our continuing mission to provide you with exceptional heart care, we have created designated Provider Care Teams. These Care Teams include your primary Cardiologist (physician) and Advanced Practice Providers (APPs- Physician Assistants and Nurse Practitioners) who all work together to provide you with the care you need, when you need it.  ? ?You may see any of the following providers on your designated Care Team at your next follow up: ?Dr Arvilla Meres ?Dr Marca Ancona ?Tonye Becket, NP ?Robbie Lis, PA ?Jessica Milford,NP ?Anna Genre, PA ?Karle Plumber, PharmD ? ? ?Please be sure to bring in all your medications bottles to every appointment.  ? ? ? ?

## 2022-04-09 NOTE — Progress Notes (Signed)
PCP: Neale Burly, MD ?Cardiology: Dr. Harl Bowie ?HF Cardiology: Dr. Aundra Dubin ? ?76 y.o. with history of CAD, diastolic CHF, and poorly controlled HTN was referred by Dr. Harl Bowie for CHF evaluation. Patient had CABG in 2008.  She has poorly controlled HTN and CKD stage 3.  She was admitted in 10/21 with hypertensive urgency and CHF due to medication noncompliance.  She was admitted again in 3/22 with acute diastolic CHF, AKI to 2.9. HS-TnI was 1777 but cath was not done due to AKI.  Echo showed EF 60-65% with normal RV.  She was again admitted (this time to [facility]) in 5/22 with CHF.  No med changes were made.  Given multiple admission with CHF and difficulty managing volume, she was referred to CHF clinic.  Torsemide was increased and Jardiance was started.  ? ?LHC/RHC in 6/22 showed mildly elevated RA pressure, normal PCWP.  LIMA-LAD and SVG-PDA were patent, sequential SVG-OM and ramus only touched down on OM.  Native RCA has a heavily calcified ostium with 95% stenosis, very difficult to seat the catheter due to calcification.  The native PDA is occluded at the ostium, and SVG-PDA only fills the PDA (does not back-fill to PLV and RCA).  Therefore, PLV territory is likely jeopardized by the ostial RCA stenosis. I reviewed the films with Dr. Martinique.  Potentially could intervene on ostial RCA using atherectomy (would be difficult).  As patient's primary symptoms are CHF-related and this is improved with diuresis, will manage medically now with option for PCI if she develops chest pain, etc. ? ?Patient returns for followup of CHF.  Increased dyspnea x 2 weeks, high sodium intake with frozen meals. Abdomen feels tight.  She is short of breath walking around the house.  She has orthopnea.  She uses 2L home oxygen.  Weight is up 15 lbs. No chest pain.  ? ?Cardiomems (04/07/22) PADP 19 mmHg (goal 18) ? ?ECG (personally reviewed): NSR, nonspecific TW inversions ? ?Labs (5/22): Pro-BNP 2347, K 4.1, creatinine 1.46 ?Labs  (6/22): K 3.9, creatinine 1.75 ?Labs (11/22): K 4.3, creatinine 1.5 ? ?PMH: ?1. Hyperlipidemia ?2. HTN ?3. CAD: CABG 2008 with LIMA-LAD, SVG-ramus, SVG-OM, SVG-PDA.  ?- Cardiolite (11/21): Small reversible inferolateral defect.  ?- LHC (6/22): LIMA-LAD and SVG-PDA were patent, sequential SVG-OM and ramus only touched down on OM.  Native RCA has a heavily calcified ostium with 95% stenosis, very difficult to seat the catheter due to calcification.  The native PDA is occluded at the ostium, and SVG-PDA only fills the PDA (does not back-fill to PLV and RCA).  Therefore, PLV territory is likely jeopardized by the ostial RCA stenosis. I reviewed the films with Dr. Martinique.  Potentially could intervene on ostial RCA using atherectomy (would be difficult).  As patient's primary symptoms are CHF-related and this is improved with diuresis, will manage medically now with option for PCI if she develops chest pain, etc. ?4. Chronic diastolic CHF: Echo ([id number]) with EF 60-65%, normal RV.  ?- Multiple admission with CHF.  ?- RHC (6/22): mean RA 10, PA 42/15 mean 25, mean PCWP 13, CI 3.72, PVR 1.7 WU.  ?- Cardiomems ?5. CKD: Stage 3.  ? ?SH: Lives in [place], retired from [employer], lives alone, has a son in [place].  Quit smoking in 2003, no ETOH.  ? ?Family History  ?Problem Relation Age of Onset  ? Cancer Other   ?     Family Hx of Cancer  ? Coronary artery disease Other   ?  Family Hx of CAD  ? ?ROS: All systems reviewed and negative except as per HPI.  ? ?Current Outpatient Medications  ?Medication Sig Dispense Refill  ? acetaminophen (TYLENOL) 500 MG tablet Take 500-1,000 mg by mouth every 6 (six) hours as needed (pain).    ? ALPRAZolam (XANAX) 0.5 MG tablet Take 0.5 mg by mouth at bedtime as needed for anxiety or sleep.    ? aspirin EC 81 MG tablet Take 81 mg by mouth every evening. Swallow whole.    ? atorvastatin (LIPITOR) 40 MG tablet Take 40 mg by mouth every evening.  0  ? empagliflozin (JARDIANCE)  10 MG TABS tablet Take 1 tablet (10 mg total) by mouth daily before breakfast. 90 tablet 3  ? HUMALOG 100 UNIT/ML injection Inject 0.75 Units into the skin with breakfast, with lunch, and with evening meal. With insulin pump 24 hours adjust sometime when eat    ? hydrALAZINE (APRESOLINE) 50 MG tablet Taking one tablet by mouth three times daily    ? ibandronate (BONIVA) 150 MG tablet Take 150 mg by mouth every 30 (thirty) days.    ? Insulin Disposable Pump (OMNIPOD DASH PODS, GEN 4,) MISC Take by mouth every 3 (three) days.    ? Insulin Pen Needle (COMFORT TOUCH INSULIN PEN NEED) 31G X 5 MM MISC 1 each by Does not apply route in the morning, at noon, in the evening, and at bedtime. 100 each 0  ? ipratropium (ATROVENT) 0.03 % nasal spray Place 1 spray into both nostrils 4 (four) times daily as needed (allergies).    ? isosorbide dinitrate (ISORDIL) 10 MG tablet Take 10 mg by mouth 3 (three) times daily.    ? levocetirizine (XYZAL) 5 MG tablet Take 5 mg by mouth at bedtime.    ? loratadine (CLARITIN) 10 MG tablet Take 10 mg by mouth daily as needed for allergies.    ? metoprolol tartrate (LOPRESSOR) 25 MG tablet Take 1/2 (one-half) tablet by mouth twice daily 30 tablet 0  ? nitroGLYCERIN (NITROSTAT) 0.4 MG SL tablet DISSOLVE ONE TABLET UNDER THE TONGUE EVERY 5 MINUTES AS NEEDED FOR CHEST PAIN.  DO NOT EXCEED A TOTAL OF 3 DOSES IN 15 MINUTES 25 tablet 3  ? omega-3 fish oil (MAXEPA) 1000 MG CAPS capsule Take 1,000 mg by mouth in the morning.    ? omeprazole (PRILOSEC) 20 MG capsule Take 20 mg by mouth in the morning.    ? ondansetron (ZOFRAN) 4 MG tablet Take 4 mg by mouth every 6 (six) hours as needed for nausea.    ? ONETOUCH VERIO test strip     ? OXYGEN Inhale 2 L into the lungs daily as needed (Active).    ? PE-Triprolidine-DM-GG-APAP (MUCINEX CNG/CGH/COLD/FLU DY/NT PO) Take by mouth. Patient takes 2 tablets every 4 hours as needed.    ? Potassium Chloride ER 20 MEQ TBCR Take 40 mEq by mouth in the morning, at  noon, in the evening, and at bedtime.    ? promethazine (PHENERGAN) 25 MG tablet Take 12.5 mg by mouth every 6 (six) hours as needed for vomiting or nausea.    ? spironolactone (ALDACTONE) 25 MG tablet Take 1 tablet (25 mg total) by mouth daily. 30 tablet 2  ? torsemide (DEMADEX) 20 MG tablet Take 4 tablets (80 mg total) by mouth 2 (two) times daily. 240 tablet 6  ? traMADol (ULTRAM) 50 MG tablet Take 50 mg by mouth 3 (three) times daily as needed (arthritis pain).    ?  TRELEGY ELLIPTA 200-62.5-25 MCG/ACT AEPB Inhale 1 puff into the lungs daily.    ? vitamin C (ASCORBIC ACID) 500 MG tablet Take 500 mg by mouth in the morning.    ? metolazone (ZAROXOLYN) 2.5 MG tablet Take as directed by clinic Take 2.5 mg x 1 tomorrow 5 tablet 0  ? ?No current facility-administered medications for this encounter.  ? ?BP 130/70   Pulse 60   Wt 98.7 kg (217 lb 9.6 oz)   SpO2 92% Comment: 2l n/c  BMI 42.50 kg/m?  ?General: NAD ?Neck: JVP 12 cm, no thyromegaly or thyroid nodule.  ?Lungs: Clear to auscultation bilaterally with normal respiratory effort. ?CV: Nondisplaced PMI.  Heart regular S1/S2, no S3/S4, no murmur.  1+ edema to knees.  No carotid bruit.  Normal pedal pulses.  ?Abdomen: Soft, nontender, no hepatosplenomegaly, no distention.  ?Skin: Intact without lesions or rashes.  ?Neurologic: Alert and oriented x 3.  ?Psych: Normal affect. ?Extremities: No clubbing or cyanosis.  ?HEENT: Normal.  ? ?Assessment/Plan: ?1. Chronic diastolic CHF: Echo in [id number] with EF 60-65%, normal RV.  She does not have LVH on echo so suspect cardiac amyloidosis is unlikely.  NYHA class III symptoms, still volume overloaded.  Multiple CHF admissions.  Volume management is complicated by CKD stage 3.  She is volume overloaded on exam today with NYHA class IIIb symptoms. Weight is up.  Cardiomems mildly above goal.  ?- I think we need to lower Cardiomems goal to about 15.  ?- Increase torsemide to 80 mg bid.  She will take metolazone 2.5 x 1 with am  torsemide tomorrow.  BMET/BNP today and BMET in 10 days.  ?- Continue Jardiance 10 mg daily.  ?- Add spironolactone 12.5 mg daily.  ?2. HTN: BP controlled today.  ?- Continue hydralazine 50 mg tid.   ?3. CAD

## 2022-04-18 DIAGNOSIS — I5022 Chronic systolic (congestive) heart failure: Secondary | ICD-10-CM | POA: Diagnosis not present

## 2022-04-18 NOTE — Progress Notes (Signed)
PCP: Toma Deiters, MD Cardiology: Dr. Wyline Mood HF Cardiology: Dr. Shirlee Latch  76 y.o. with history of CAD, diastolic CHF, and poorly controlled HTN was referred by Dr. Wyline Mood for CHF evaluation. Patient had CABG in 2008.  She has poorly controlled HTN and CKD stage 3.  She was admitted in 10/21 with hypertensive urgency and CHF due to medication noncompliance.  She was admitted again in 3/22 with acute diastolic CHF, AKI to 2.9. HS-TnI was 1777 but cath was not done due to AKI.  Echo showed EF 60-65% with normal RV.  She was again admitted (this time to [facility]) in 5/22 with CHF.  No med changes were made.  Given multiple admission with CHF and difficulty managing volume, she was referred to CHF clinic.  Torsemide was increased and Jardiance was started.   LHC/RHC in 6/22 showed mildly elevated RA pressure, normal PCWP.  LIMA-LAD and SVG-PDA were patent, sequential SVG-OM and ramus only touched down on OM.  Native RCA has a heavily calcified ostium with 95% stenosis, very difficult to seat the catheter due to calcification.  The native PDA is occluded at the ostium, and SVG-PDA only fills the PDA (does not back-fill to PLV and RCA).  Therefore, PLV territory is likely jeopardized by the ostial RCA stenosis. I reviewed the films with Dr. [place].  Potentially could intervene on ostial RCA using atherectomy (would be difficult).  As patient's primary symptoms are CHF-related and this is improved with diuresis, will manage medically now with option for PCI if she develops chest pain, etc.  Patient returns for followup of CHF.  Increased dyspnea x 2 weeks, high sodium intake with frozen meals. Abdomen feels tight.  She is short of breath walking around the house.  She has orthopnea.  She uses 2L home oxygen.  Weight is up 15 lbs. No chest pain.   Cardiomems (04/07/22) PADP 19 mmHg (goal 18)  ECG (personally reviewed): NSR, nonspecific TW inversions  Labs (5/22): Pro-BNP 2347, K 4.1, creatinine 1.46 Labs  (6/22): K 3.9, creatinine 1.75 Labs (11/22): K 4.3, creatinine 1.5  PMH: 1. Hyperlipidemia 2. HTN 3. CAD: CABG 2008 with LIMA-LAD, SVG-ramus, SVG-OM, SVG-PDA.  - Cardiolite (11/21): Small reversible inferolateral defect.  - LHC (6/22): LIMA-LAD and SVG-PDA were patent, sequential SVG-OM and ramus only touched down on OM.  Native RCA has a heavily calcified ostium with 95% stenosis, very difficult to seat the catheter due to calcification.  The native PDA is occluded at the ostium, and SVG-PDA only fills the PDA (does not back-fill to PLV and RCA).  Therefore, PLV territory is likely jeopardized by the ostial RCA stenosis. I reviewed the films with Dr. [place].  Potentially could intervene on ostial RCA using atherectomy (would be difficult).  As patient's primary symptoms are CHF-related and this is improved with diuresis, will manage medically now with option for PCI if she develops chest pain, etc. 4. Chronic diastolic CHF: Echo (3/22) with EF 60-65%, normal RV.  - Multiple admission with CHF.  - RHC (6/22): mean RA 10, PA 42/15 mean 25, mean PCWP 13, CI 3.72, PVR 1.7 WU.  - Cardiomems 5. CKD: Stage 3.   SH: Lives in [place], retired from [employer], lives alone, has a son in [place].  Quit smoking in 2003, no ETOH.   Family History  Problem Relation Age of Onset   Cancer Other        Family Hx of Cancer   Coronary artery disease Other  Family Hx of CAD   ROS: All systems reviewed and negative except as per HPI.   Current Outpatient Medications  Medication Sig Dispense Refill   acetaminophen (TYLENOL) 500 MG tablet Take 500-1,000 mg by mouth every 6 (six) hours as needed (pain).     ALPRAZolam (XANAX) 0.5 MG tablet Take 0.5 mg by mouth at bedtime as needed for anxiety or sleep.     aspirin EC 81 MG tablet Take 81 mg by mouth every evening. Swallow whole.     atorvastatin (LIPITOR) 40 MG tablet Take 40 mg by mouth every evening.  0   empagliflozin (JARDIANCE)  10 MG TABS tablet Take 1 tablet (10 mg total) by mouth daily before breakfast. 90 tablet 3   HUMALOG 100 UNIT/ML injection Inject 0.75 Units into the skin with breakfast, with lunch, and with evening meal. With insulin pump 24 hours adjust sometime when eat     hydrALAZINE (APRESOLINE) 50 MG tablet Taking one tablet by mouth three times daily     ibandronate (BONIVA) 150 MG tablet Take 150 mg by mouth every 30 (thirty) days.     Insulin Disposable Pump (OMNIPOD DASH PODS, GEN 4,) MISC Take by mouth every 3 (three) days.     Insulin Pen Needle (COMFORT TOUCH INSULIN PEN NEED) 31G X 5 MM MISC 1 each by Does not apply route in the morning, at noon, in the evening, and at bedtime. 100 each 0   ipratropium (ATROVENT) 0.03 % nasal spray Place 1 spray into both nostrils 4 (four) times daily as needed (allergies).     isosorbide dinitrate (ISORDIL) 10 MG tablet Take 10 mg by mouth 3 (three) times daily.     levocetirizine (XYZAL) 5 MG tablet Take 5 mg by mouth at bedtime.     loratadine (CLARITIN) 10 MG tablet Take 10 mg by mouth daily as needed for allergies.     metolazone (ZAROXOLYN) 2.5 MG tablet Take as directed by clinic Take 2.5 mg x 1 tomorrow 5 tablet 0   metoprolol tartrate (LOPRESSOR) 25 MG tablet Take 1/2 (one-half) tablet by mouth twice daily 30 tablet 0   nitroGLYCERIN (NITROSTAT) 0.4 MG SL tablet DISSOLVE ONE TABLET UNDER THE TONGUE EVERY 5 MINUTES AS NEEDED FOR CHEST PAIN.  DO NOT EXCEED A TOTAL OF 3 DOSES IN 15 MINUTES 25 tablet 3   omega-3 fish oil (MAXEPA) 1000 MG CAPS capsule Take 1,000 mg by mouth in the morning.     omeprazole (PRILOSEC) 20 MG capsule Take 20 mg by mouth in the morning.     ondansetron (ZOFRAN) 4 MG tablet Take 4 mg by mouth every 6 (six) hours as needed for nausea.     ONETOUCH VERIO test strip      OXYGEN Inhale 2 L into the lungs daily as needed (Active).     PE-Triprolidine-DM-GG-APAP (MUCINEX CNG/CGH/COLD/FLU DY/NT PO) Take by mouth. Patient takes 2 tablets every  4 hours as needed.     Potassium Chloride ER 20 MEQ TBCR Take 40 mEq by mouth in the morning, at noon, in the evening, and at bedtime.     promethazine (PHENERGAN) 25 MG tablet Take 12.5 mg by mouth every 6 (six) hours as needed for vomiting or nausea.     spironolactone (ALDACTONE) 25 MG tablet Take 1 tablet (25 mg total) by mouth daily. 30 tablet 2   torsemide (DEMADEX) 20 MG tablet Take 4 tablets (80 mg total) by mouth 2 (two) times daily. 240 tablet 6  traMADol (ULTRAM) 50 MG tablet Take 50 mg by mouth 3 (three) times daily as needed (arthritis pain).     TRELEGY ELLIPTA 200-62.5-25 MCG/ACT AEPB Inhale 1 puff into the lungs daily.     vitamin C (ASCORBIC ACID) 500 MG tablet Take 500 mg by mouth in the morning.     No current facility-administered medications for this visit.   There were no vitals taken for this visit. General: NAD Neck: JVP 12 cm, no thyromegaly or thyroid nodule.  Lungs: Clear to auscultation bilaterally with normal respiratory effort. CV: Nondisplaced PMI.  Heart regular S1/S2, no S3/S4, no murmur.  1+ edema to knees.  No carotid bruit.  Normal pedal pulses.  Abdomen: Soft, nontender, no hepatosplenomegaly, no distention.  Skin: Intact without lesions or rashes.  Neurologic: Alert and oriented x 3.  Psych: Normal affect. Extremities: No clubbing or cyanosis.  HEENT: Normal.   Assessment/Plan: 1. Chronic diastolic CHF: Echo in [id number] with EF 60-65%, normal RV.  She does not have LVH on echo so suspect cardiac amyloidosis is unlikely.  NYHA class III symptoms, still volume overloaded.  Multiple CHF admissions.  Volume management is complicated by CKD stage 3.  She is volume overloaded on exam today with NYHA class IIIb symptoms. Weight is up.  Cardiomems mildly above goal.  - I think we need to lower Cardiomems goal to about 15.  - Increase torsemide to 80 mg bid.  She will take metolazone 2.5 x 1 with am torsemide tomorrow.  BMET/BNP today and BMET in 10 days.  -  Continue Jardiance 10 mg daily.  - Add spironolactone 12.5 mg daily.  2. HTN: BP controlled today.  - Continue hydralazine 50 mg tid.   3. CAD: S/p CABG in 2008.  HS-TnI up to 1777 during 3/22 admission but no cath due to AKI.  She has prominent exertional dyspnea but only atypical chest pain.  LHC in 6/22 showed that LIMA-LAD and SVG-PDA were patent, sequential SVG-OM and ramus only touched down on OM.  Native RCA has a heavily calcified ostium with 95% stenosis, very difficult to seat the catheter due to calcification.  The native PDA is occluded at the ostium, and SVG-PDA only fills the PDA (does not back-fill to PLV and RCA).  Therefore, PLV territory is likely jeopardized by the ostial RCA stenosis. I reviewed the films with Dr. [place].  Potentially could intervene on ostial RCA using atherectomy (would be difficult).  As patient's primary symptoms are CHF-related and this is improved with diuresis, will manage medically now with option for PCI if she develops chest pain, etc.  No chest pain.  - Continue ASA 81 daily.  - Continue atorvastatin 40 mg daily, check lipids today.  - Continue metoprolol 12.5 bid.  4. Suspect OSA: She says that she would not wear CPAP, so sleep study not ordered.  5. CKD stage 3: Last creatinine 1.5.  BMET today.  6. OHS: She is on 2 L home oxygen.   Followup in 10 days with APP.   [place] 04/18/2022

## 2022-04-19 ENCOUNTER — Ambulatory Visit (HOSPITAL_COMMUNITY)
Admission: RE | Admit: 2022-04-19 | Discharge: 2022-04-19 | Disposition: A | Discharge status: Home / Self Care | Payer: Medicare Other | Source: Ambulatory Visit | Attending: Family Medicine | Admitting: Family Medicine

## 2022-04-19 ENCOUNTER — Encounter (HOSPITAL_COMMUNITY): Payer: Self-pay

## 2022-04-19 VITALS — BP 166/60 | HR 60 | Wt 209.2 lb

## 2022-04-19 DIAGNOSIS — I10 Essential (primary) hypertension: Secondary | ICD-10-CM

## 2022-04-19 DIAGNOSIS — E662 Morbid (severe) obesity with alveolar hypoventilation: Secondary | ICD-10-CM

## 2022-04-19 DIAGNOSIS — I2510 Atherosclerotic heart disease of native coronary artery without angina pectoris: Secondary | ICD-10-CM | POA: Diagnosis not present

## 2022-04-19 DIAGNOSIS — Z951 Presence of aortocoronary bypass graft: Secondary | ICD-10-CM | POA: Insufficient documentation

## 2022-04-19 DIAGNOSIS — I5032 Chronic diastolic (congestive) heart failure: Secondary | ICD-10-CM | POA: Insufficient documentation

## 2022-04-19 DIAGNOSIS — Z7984 Long term (current) use of oral hypoglycemic drugs: Secondary | ICD-10-CM | POA: Insufficient documentation

## 2022-04-19 DIAGNOSIS — Z79899 Other long term (current) drug therapy: Secondary | ICD-10-CM | POA: Diagnosis not present

## 2022-04-19 DIAGNOSIS — Z7901 Long term (current) use of anticoagulants: Secondary | ICD-10-CM | POA: Diagnosis not present

## 2022-04-19 DIAGNOSIS — Z7982 Long term (current) use of aspirin: Secondary | ICD-10-CM | POA: Insufficient documentation

## 2022-04-19 DIAGNOSIS — I5033 Acute on chronic diastolic (congestive) heart failure: Secondary | ICD-10-CM | POA: Diagnosis not present

## 2022-04-19 DIAGNOSIS — N183 Chronic kidney disease, stage 3 unspecified: Secondary | ICD-10-CM | POA: Insufficient documentation

## 2022-04-19 DIAGNOSIS — I130 Hypertensive heart and chronic kidney disease with heart failure and stage 1 through stage 4 chronic kidney disease, or unspecified chronic kidney disease: Secondary | ICD-10-CM | POA: Insufficient documentation

## 2022-04-19 DIAGNOSIS — R0789 Other chest pain: Secondary | ICD-10-CM | POA: Diagnosis not present

## 2022-04-19 DIAGNOSIS — R29818 Other symptoms and signs involving the nervous system: Secondary | ICD-10-CM | POA: Diagnosis not present

## 2022-04-19 NOTE — Patient Instructions (Signed)
Thank you for coming in today     Do the following things EVERYDAY: Weigh yourself in the morning before breakfast. Write it down and keep it in a log. Take your medicines as prescribed Eat low salt foods--Limit salt (sodium) to 2000 mg per day.  Stay as active as you can everyday Limit all fluids for the day to less than 2 liters   Your physician recommends that you return for lab work in:  2 weeks you have been given a prescription to give to your local lab  Please check blood pressure at home and notify the clinic if your systolic top number is over 130's  Your physician recommends that you schedule a follow-up appointment in:  3 months with Dr. Aundra Dubin  At the [facility], you and your health needs are our priority. As part of our continuing mission to provide you with exceptional heart care, we have created designated Provider Care Teams. These Care Teams include your primary Cardiologist (physician) and Advanced Practice Providers (APPs- Physician Assistants and Nurse Practitioners) who all work together to provide you with the care you need, when you need it.   You may see any of the following providers on your designated Care Team at your next follow up: Dr Glori Bickers Dr Haynes Kerns, NP Lyda Jester, [place] [facility] [place], [place] Audry Riles, PharmD   Please be sure to bring in all your medications bottles to every appointment.   If you have any questions or concerns before your next appointment please send [place] a message through [place] or call our office at [phone].    TO LEAVE A MESSAGE FOR THE NURSE SELECT OPTION 2, PLEASE LEAVE A MESSAGE INCLUDING: YOUR NAME DATE OF BIRTH CALL BACK NUMBER REASON FOR CALL**this is important as we prioritize the call backs  YOU WILL RECEIVE A CALL BACK THE SAME DAY AS LONG AS YOU CALL BEFORE 4:00 PM

## 2022-04-20 ENCOUNTER — Telehealth (HOSPITAL_COMMUNITY): Payer: Self-pay | Admitting: *Deleted

## 2022-04-20 MED ORDER — ISOSORBIDE DINITRATE 20 MG PO TABS: 20.0000 mg | ORAL_TABLET | Freq: Three times a day (TID) | ORAL | 3 refills | Status: DC

## 2022-04-20 NOTE — Telephone Encounter (Signed)
Pt aware and agreeable with plan.  

## 2022-04-20 NOTE — Telephone Encounter (Signed)
Pt left vm stating she was supposed to notify the clinic if her systolic bp was over 130. Pt said her systolic bp today was 150. Pt checked her bp 3 hours after taking morning medications. Pt said to remind Shanda Bumps [place],FNP that she can not tolerate taking hydralazine 50mg   2 tablets three times daily it made her to sluggish so it was decreased to 1 tablet tid.   Routed to 

## 2022-04-27 ENCOUNTER — Telehealth (HOSPITAL_COMMUNITY): Payer: Self-pay | Admitting: *Deleted

## 2022-04-27 NOTE — Telephone Encounter (Signed)
Pt left vm stating her bp is still elevated despite recent med increase. Pt said her systolic bp is 160s-170s.

## 2022-04-28 DIAGNOSIS — I10 Essential (primary) hypertension: Secondary | ICD-10-CM | POA: Diagnosis not present

## 2022-04-28 DIAGNOSIS — R778 Other specified abnormalities of plasma proteins: Secondary | ICD-10-CM | POA: Diagnosis not present

## 2022-04-28 DIAGNOSIS — R519 Headache, unspecified: Secondary | ICD-10-CM | POA: Diagnosis not present

## 2022-04-28 DIAGNOSIS — E119 Type 2 diabetes mellitus without complications: Secondary | ICD-10-CM | POA: Diagnosis not present

## 2022-05-01 DIAGNOSIS — I5032 Chronic diastolic (congestive) heart failure: Secondary | ICD-10-CM | POA: Diagnosis not present

## 2022-05-01 NOTE — Telephone Encounter (Signed)
Pt already takes isordil 20mg  tid

## 2022-05-01 NOTE — Telephone Encounter (Signed)
Pt said she does not want to increase hydralazine it made her too sluggish. Pt asked if there was any other changes she could make.

## 2022-05-02 NOTE — Telephone Encounter (Signed)
Left vm for pt to return my call.  

## 2022-05-04 DIAGNOSIS — I10 Essential (primary) hypertension: Secondary | ICD-10-CM | POA: Diagnosis not present

## 2022-05-04 DIAGNOSIS — R778 Other specified abnormalities of plasma proteins: Secondary | ICD-10-CM | POA: Diagnosis not present

## 2022-05-04 DIAGNOSIS — R519 Headache, unspecified: Secondary | ICD-10-CM | POA: Diagnosis not present

## 2022-05-11 ENCOUNTER — Other Ambulatory Visit (HOSPITAL_COMMUNITY): Payer: Self-pay | Admitting: Cardiology

## 2022-05-16 ENCOUNTER — Telehealth (HOSPITAL_COMMUNITY): Payer: Self-pay | Admitting: Pharmacy Technician

## 2022-05-16 ENCOUNTER — Other Ambulatory Visit (HOSPITAL_COMMUNITY): Payer: Self-pay

## 2022-05-16 NOTE — Telephone Encounter (Signed)
Advanced Heart Failure Patient Advocate Encounter  The patient was approved for a [employer] grant that will help cover the cost of Jardiance. Total amount awarded, $10,000. Eligibility, 04/16/22 - 04/16/23.  ID [id number]  BIN [id number]  PCN [id number]  Group [id number]  Emailed the patient a copy of the grant information to take to the pharmacy.   Archer Asa, CPhT

## 2022-05-26 DIAGNOSIS — I10 Essential (primary) hypertension: Secondary | ICD-10-CM | POA: Diagnosis not present

## 2022-05-26 DIAGNOSIS — E1121 Type 2 diabetes mellitus with diabetic nephropathy: Secondary | ICD-10-CM | POA: Diagnosis not present

## 2022-05-28 DIAGNOSIS — I10 Essential (primary) hypertension: Secondary | ICD-10-CM | POA: Diagnosis not present

## 2022-05-28 DIAGNOSIS — R778 Other specified abnormalities of plasma proteins: Secondary | ICD-10-CM | POA: Diagnosis not present

## 2022-05-28 DIAGNOSIS — R519 Headache, unspecified: Secondary | ICD-10-CM | POA: Diagnosis not present

## 2022-05-28 DIAGNOSIS — E119 Type 2 diabetes mellitus without complications: Secondary | ICD-10-CM | POA: Diagnosis not present

## 2022-06-03 DIAGNOSIS — R778 Other specified abnormalities of plasma proteins: Secondary | ICD-10-CM | POA: Diagnosis not present

## 2022-06-03 DIAGNOSIS — I10 Essential (primary) hypertension: Secondary | ICD-10-CM | POA: Diagnosis not present

## 2022-06-03 DIAGNOSIS — R519 Headache, unspecified: Secondary | ICD-10-CM | POA: Diagnosis not present

## 2022-06-12 DIAGNOSIS — Z1231 Encounter for screening mammogram for malignant neoplasm of breast: Secondary | ICD-10-CM | POA: Diagnosis not present

## 2022-06-26 DIAGNOSIS — M5442 Lumbago with sciatica, left side: Secondary | ICD-10-CM | POA: Diagnosis not present

## 2022-06-26 DIAGNOSIS — I10 Essential (primary) hypertension: Secondary | ICD-10-CM | POA: Diagnosis not present

## 2022-06-26 DIAGNOSIS — J4531 Mild persistent asthma with (acute) exacerbation: Secondary | ICD-10-CM | POA: Diagnosis not present

## 2022-06-28 DIAGNOSIS — R519 Headache, unspecified: Secondary | ICD-10-CM | POA: Diagnosis not present

## 2022-06-28 DIAGNOSIS — I10 Essential (primary) hypertension: Secondary | ICD-10-CM | POA: Diagnosis not present

## 2022-06-28 DIAGNOSIS — E119 Type 2 diabetes mellitus without complications: Secondary | ICD-10-CM | POA: Diagnosis not present

## 2022-06-28 DIAGNOSIS — R778 Other specified abnormalities of plasma proteins: Secondary | ICD-10-CM | POA: Diagnosis not present

## 2022-07-04 DIAGNOSIS — I10 Essential (primary) hypertension: Secondary | ICD-10-CM | POA: Diagnosis not present

## 2022-07-04 DIAGNOSIS — R519 Headache, unspecified: Secondary | ICD-10-CM | POA: Diagnosis not present

## 2022-07-04 DIAGNOSIS — R778 Other specified abnormalities of plasma proteins: Secondary | ICD-10-CM | POA: Diagnosis not present

## 2022-07-06 ENCOUNTER — Other Ambulatory Visit (HOSPITAL_COMMUNITY): Payer: Self-pay | Admitting: Cardiology

## 2022-07-12 ENCOUNTER — Other Ambulatory Visit (HOSPITAL_COMMUNITY): Payer: Self-pay

## 2022-07-12 MED ORDER — ISOSORBIDE DINITRATE 20 MG PO TABS: 20.0000 mg | ORAL_TABLET | Freq: Three times a day (TID) | ORAL | 3 refills | Status: DC

## 2022-07-21 ENCOUNTER — Telehealth (HOSPITAL_COMMUNITY): Payer: Self-pay | Admitting: Surgery

## 2022-07-21 ENCOUNTER — Ambulatory Visit (HOSPITAL_COMMUNITY)
Admission: RE | Admit: 2022-07-21 | Discharge: 2022-07-21 | Disposition: A | Discharge status: Home / Self Care | Payer: Medicare Other | Source: Ambulatory Visit | Attending: Cardiology | Admitting: Cardiology

## 2022-07-21 ENCOUNTER — Encounter (HOSPITAL_COMMUNITY): Payer: Self-pay | Admitting: Cardiology

## 2022-07-21 VITALS — BP 140/60 | HR 59 | Wt 210.6 lb

## 2022-07-21 DIAGNOSIS — R0789 Other chest pain: Secondary | ICD-10-CM | POA: Insufficient documentation

## 2022-07-21 DIAGNOSIS — N183 Chronic kidney disease, stage 3 unspecified: Secondary | ICD-10-CM | POA: Insufficient documentation

## 2022-07-21 DIAGNOSIS — Z951 Presence of aortocoronary bypass graft: Secondary | ICD-10-CM | POA: Diagnosis not present

## 2022-07-21 DIAGNOSIS — I130 Hypertensive heart and chronic kidney disease with heart failure and stage 1 through stage 4 chronic kidney disease, or unspecified chronic kidney disease: Secondary | ICD-10-CM | POA: Insufficient documentation

## 2022-07-21 DIAGNOSIS — Z7984 Long term (current) use of oral hypoglycemic drugs: Secondary | ICD-10-CM | POA: Diagnosis not present

## 2022-07-21 DIAGNOSIS — I5032 Chronic diastolic (congestive) heart failure: Secondary | ICD-10-CM | POA: Insufficient documentation

## 2022-07-21 DIAGNOSIS — I2510 Atherosclerotic heart disease of native coronary artery without angina pectoris: Secondary | ICD-10-CM | POA: Insufficient documentation

## 2022-07-21 DIAGNOSIS — R0602 Shortness of breath: Secondary | ICD-10-CM | POA: Insufficient documentation

## 2022-07-21 DIAGNOSIS — Z7982 Long term (current) use of aspirin: Secondary | ICD-10-CM | POA: Diagnosis not present

## 2022-07-21 DIAGNOSIS — Z79899 Other long term (current) drug therapy: Secondary | ICD-10-CM | POA: Insufficient documentation

## 2022-07-21 LAB — BASIC METABOLIC PANEL
Anion gap: 13 (ref 5–15)
BUN: 23 mg/dL (ref 8–23)
CO2: 30 mmol/L (ref 22–32)
Calcium: 9.4 mg/dL (ref 8.9–10.3)
Chloride: 94 mmol/L — ABNORMAL LOW (ref 98–111)
Creatinine, Ser: 1.8 mg/dL — ABNORMAL HIGH (ref 0.44–1.00)
GFR, Estimated: 29 mL/min — ABNORMAL LOW (ref >60)
Glucose, Bld: 85 mg/dL (ref 70–99)
Potassium: 3.9 mmol/L (ref 3.5–5.1)
Sodium: 137 mmol/L (ref 135–145)

## 2022-07-21 LAB — BRAIN NATRIURETIC PEPTIDE: B Natriuretic Peptide: 327.7 pg/mL — ABNORMAL HIGH (ref 0.0–100.0)

## 2022-07-21 MED ORDER — METOLAZONE 2.5 MG PO TABS: 2.5000 mg | ORAL_TABLET | ORAL | 3 refills | Status: DC

## 2022-07-21 MED ORDER — HYDRALAZINE HCL 50 MG PO TABS: 75.0000 mg | ORAL_TABLET | Freq: Three times a day (TID) | ORAL | 3 refills | Status: DC

## 2022-07-21 NOTE — Telephone Encounter (Signed)
I called patient to review results and recommendations per provider.  I left a message for a return call. 

## 2022-07-21 NOTE — Telephone Encounter (Signed)
-----   Message from Laurey Morale, MD sent at 07/21/2022  3:49 PM EDT ----- No change to plan but make sure to get 10 day BMET.

## 2022-07-21 NOTE — Patient Instructions (Signed)
EKG done today.  Labs done today. We will contact you only if your labs are abnormal.  INCREASE Metolazone to 2.5mg  (1 tablet) by mouth every Saturday.   INCREASE Hydralazine to 75mg  (1 & 1/2 tablets) by mouth 3 times daily.   No other medication changes were made. Please continue all current medications as prescribed.  Your physician recommends that you schedule a follow-up appointment in: 10 days for a lab only appointment (a paper prescription was provided to your during your appointment today) and in 1 month with our NP/PA Clinic here in our office.   If you have any questions or concerns before your next appointment please send a message through [place] or call our office at [phone].    TO LEAVE A MESSAGE FOR THE NURSE SELECT OPTION 2, PLEASE LEAVE A MESSAGE INCLUDING: YOUR NAME DATE OF BIRTH CALL BACK NUMBER REASON FOR CALL**this is important as we prioritize the call backs  YOU WILL RECEIVE A CALL BACK THE SAME DAY AS LONG AS YOU CALL BEFORE 4:00 PM   Do the following things EVERYDAY: Weigh yourself in the morning before breakfast. Write it down and keep it in a log. Take your medicines as prescribed Eat low salt foods--Limit salt (sodium) to 2000 mg per day.  Stay as active as you can everyday Limit all fluids for the day to less than 2 liters   At the [facility], you and your health needs are our priority. As part of our continuing mission to provide you with exceptional heart care, we have created designated Provider Care Teams. These Care Teams include your primary Cardiologist (physician) and Advanced Practice Providers (APPs- Physician Assistants and Nurse Practitioners) who all work together to provide you with the care you need, when you need it.   You may see any of the following providers on your designated Care Team at your next follow up: Dr [phone] Dr Arvilla Meres, NP Carron Curie, Robbie Lis [place],  PharmD   Please be sure to bring in all your medications bottles to every appointment.

## 2022-07-23 NOTE — Progress Notes (Signed)
PCP: Toma Deiters, MD Cardiology: Dr. Wyline Mood HF Cardiology: Dr. Shirlee Latch  76 y.o. with history of CAD, diastolic CHF, and poorly controlled HTN was referred by Dr. Wyline Mood for CHF evaluation. Patient had CABG in 2008.  She has poorly controlled HTN and CKD stage 3.  She was admitted in 10/21 with hypertensive urgency and CHF due to medication noncompliance.  She was admitted again in 3/22 with acute diastolic CHF, AKI to 2.9. HS-TnI was 1777 but cath was not done due to AKI.  Echo showed EF 60-65% with normal RV.  She was again admitted (this time to [facility]) in 5/22 with CHF.  No med changes were made.  Given multiple admission with CHF and difficulty managing volume, she was referred to CHF clinic.  Torsemide was increased and Jardiance was started.   LHC/RHC in 6/22 showed mildly elevated RA pressure, normal PCWP.  LIMA-LAD and SVG-PDA were patent, sequential SVG-OM and ramus only touched down on OM.  Native RCA has a heavily calcified ostium with 95% stenosis, very difficult to seat the catheter due to calcification.  The native PDA is occluded at the ostium, and SVG-PDA only fills the PDA (does not back-fill to PLV and RCA).  Therefore, PLV territory is likely jeopardized by the ostial RCA stenosis. I reviewed the films with Dr. [place].  Potentially could intervene on ostial RCA using atherectomy (would be difficult).  As patient's primary symptoms are CHF-related and this is improved with diuresis, will manage medically now with option for PCI if she develops chest pain, etc.  Patient returns for followup of CHF.  She wears oxygen on and off, usually only with exertion.  Weight is stable today. She has "good days and bad days."  Walks with a cane. Short of breath walking around the house.  She takes her dog out on the porch but does not do much more than that.  No dyspnea at rest.  No chest pain.  No orthopnea/PND.  No lightheadedness.  BP elevated.   Cardiomems: PADP 15, goal 15  ECG  (personally reviewed): NSR, TWI in AVL  Labs (5/22): Pro-BNP 2347, K 4.1, creatinine 1.46 Labs (6/22): K 3.9, creatinine 1.75 Labs (11/22): K 4.3, creatinine 1.5 Labs (5/23): LDL 59 Labs (6/23): K 4.7, creatinine 1.99  PMH: 1. Hyperlipidemia 2. HTN 3. CAD: CABG 2008 with LIMA-LAD, SVG-ramus, SVG-OM, SVG-PDA.  - Cardiolite (11/21): Small reversible inferolateral defect.  - LHC (6/22): LIMA-LAD and SVG-PDA were patent, sequential SVG-OM and ramus only touched down on OM.  Native RCA has a heavily calcified ostium with 95% stenosis, very difficult to seat the catheter due to calcification.  The native PDA is occluded at the ostium, and SVG-PDA only fills the PDA (does not back-fill to PLV and RCA).  Therefore, PLV territory is likely jeopardized by the ostial RCA stenosis. I reviewed the films with Dr. [place].  Potentially could intervene on ostial RCA using atherectomy (would be difficult).  As patient's primary symptoms are CHF-related and this is improved with diuresis, will manage medically now with option for PCI if she develops chest pain, etc. 4. Chronic diastolic CHF: Echo (3/22) with EF 60-65%, normal RV.  - Multiple admission with CHF.  - RHC (6/22): mean RA 10, PA 42/15 mean 25, mean PCWP 13, CI 3.72, PVR 1.7 WU.  - Cardiomems 5. CKD: Stage 3.   SH: Lives in [place], retired from [employer], lives alone, has a son in [place].  Quit smoking in 2003, no  ETOH.   Family History  Problem Relation Age of Onset   Cancer Other        Family Hx of Cancer   Coronary artery disease Other        Family Hx of CAD   ROS: All systems reviewed and negative except as per HPI.   Current Outpatient Medications  Medication Sig Dispense Refill   acetaminophen (TYLENOL) 500 MG tablet Take 500-1,000 mg by mouth every 6 (six) hours as needed (pain).     ALPRAZolam (XANAX) 0.5 MG tablet Take 0.5 mg by mouth at bedtime as needed for anxiety or sleep.     aspirin EC 81 MG tablet  Take 81 mg by mouth every evening. Swallow whole.     atorvastatin (LIPITOR) 40 MG tablet Take 40 mg by mouth every evening.  0   empagliflozin (JARDIANCE) 10 MG TABS tablet Take 1 tablet (10 mg total) by mouth daily before breakfast. 90 tablet 3   HUMALOG 100 UNIT/ML injection Inject 0.75 Units into the skin with breakfast, with lunch, and with evening meal. With insulin pump 24 hours adjust sometime when eat     ibandronate (BONIVA) 150 MG tablet Take 150 mg by mouth every 30 (thirty) days.     Insulin Disposable Pump (OMNIPOD DASH PODS, GEN 4,) MISC Take by mouth every 3 (three) days.     Insulin Pen Needle (COMFORT TOUCH INSULIN PEN NEED) 31G X 5 MM MISC 1 each by Does not apply route in the morning, at noon, in the evening, and at bedtime. 100 each 0   ipratropium (ATROVENT) 0.03 % nasal spray Place 1 spray into both nostrils 4 (four) times daily as needed (allergies).     isosorbide dinitrate (ISORDIL) 20 MG tablet Take 1 tablet (20 mg total) by mouth 3 (three) times daily. 90 tablet 3   levocetirizine (XYZAL) 5 MG tablet Take 5 mg by mouth as needed for allergies.     loratadine (CLARITIN) 10 MG tablet Take 10 mg by mouth daily as needed for allergies.     metoprolol tartrate (LOPRESSOR) 25 MG tablet Take 1/2 (one-half) tablet by mouth twice daily 30 tablet 0   nitroGLYCERIN (NITROSTAT) 0.4 MG SL tablet DISSOLVE ONE TABLET UNDER THE TONGUE EVERY 5 MINUTES AS NEEDED FOR CHEST PAIN.  DO NOT EXCEED A TOTAL OF 3 DOSES IN 15 MINUTES 25 tablet 3   omega-3 fish oil (MAXEPA) 1000 MG CAPS capsule Take 1,000 mg by mouth in the morning.     omeprazole (PRILOSEC) 20 MG capsule Take 20 mg by mouth in the morning.     ondansetron (ZOFRAN) 4 MG tablet Take 4 mg by mouth every 6 (six) hours as needed for nausea.     OXYGEN Inhale 2 L into the lungs daily as needed (Active).     PE-Triprolidine-DM-GG-APAP (MUCINEX CNG/CGH/COLD/FLU DY/NT PO) Take by mouth. Patient takes 2 tablets every 4 hours as needed.      Potassium Chloride ER 20 MEQ TBCR Take 40 mEq by mouth in the morning, at noon, in the evening, and at bedtime.     promethazine (PHENERGAN) 25 MG tablet Take 12.5 mg by mouth every 6 (six) hours as needed for vomiting or nausea.     spironolactone (ALDACTONE) 25 MG tablet Take 1 tablet by mouth once daily 30 tablet 0   torsemide (DEMADEX) 20 MG tablet Take 4 tablets (80 mg total) by mouth 2 (two) times daily. 240 tablet 6   traMADol (ULTRAM) 50 MG  tablet Take 50 mg by mouth 3 (three) times daily as needed (arthritis pain).     TRELEGY ELLIPTA 200-62.5-25 MCG/ACT AEPB Inhale 1 puff into the lungs daily.     vitamin C (ASCORBIC ACID) 500 MG tablet Take 500 mg by mouth in the morning.     hydrALAZINE (APRESOLINE) 50 MG tablet Take 1.5 tablets (75 mg total) by mouth 3 (three) times daily. 405 tablet 3   metolazone (ZAROXOLYN) 2.5 MG tablet Take 1 tablet (2.5 mg total) by mouth every Saturday. 12 tablet 3   No current facility-administered medications for this encounter.   BP (!) 140/60   Pulse (!) 59   Wt 95.5 kg (210 lb 9.6 oz)   SpO2 96%   BMI 41.13 kg/m  General: NAD Neck: JVP 8-9 cm with HJR, no thyromegaly or thyroid nodule.  Lungs: Clear to auscultation bilaterally with normal respiratory effort. CV: Nondisplaced PMI.  Heart regular S1/S2, no S3/S4, no murmur.  1+ ankle edema.  No carotid bruit.  Normal pedal pulses.  Abdomen: Soft, nontender, no hepatosplenomegaly, no distention.  Skin: Intact without lesions or rashes.  Neurologic: Alert and oriented x 3.  Psych: Normal affect. Extremities: No clubbing or cyanosis.  HEENT: Normal.   Assessment/Plan: 1. Chronic diastolic CHF: Echo in [id number] with EF 60-65%, normal RV.  She does not have LVH on echo so suspect cardiac amyloidosis is unlikely.  Multiple CHF admissions.  Volume management is complicated by CKD stage 3.  Cardiomems is at goal of 15 mmHg, but she looks volume overloaded on exam.  Chronic NYHA class IIIb symptoms. Weight is  up 1 lb.   - Continue torsemide 80 mg bid, add metolazone 2.5 mg once weekly on Saturdays.  BMET today and in 10 days.  - Continue Jardiance 10 mg daily.  - Continue spironolactone 25 mg daily.  - Use Cardiomems daily.  2. HTN: BP elevated.  - Increase hydralazine to 75 mg tid.  3. CAD: S/p CABG in 2008.  HS-TnI up to 1777 during 3/22 admission but no cath due to AKI.  She has prominent exertional dyspnea but only atypical chest pain.  LHC in 6/22 showed that LIMA-LAD and SVG-PDA were patent, sequential SVG-OM and ramus only touched down on OM.  Native RCA has a heavily calcified ostium with 95% stenosis, very difficult to seat the catheter due to calcification.  The native PDA is occluded at the ostium, and SVG-PDA only fills the PDA (does not back-fill to PLV and RCA).  Therefore, PLV territory is likely jeopardized by the ostial RCA stenosis. I reviewed the films with Dr. Martin[place].  Potentially could intervene on ostial RCA using atherectomy (would be difficult).  As patient's primary symptoms are CHF-related and this is improved with diuresis, will manage medically now with option for PCI if she develops chest pain, etc.  No chest pain.  - Continue ASA 81 daily.  - Continue atorvastatin 40 mg daily, good lipids 5/23.  - Continue metoprolol 12.5 bid.  4. Suspect OSA: She says that she would not wear CPAP, so sleep study not ordered.  5. CKD stage 3: Last creatinine 1.99.  BMET today.  6. OHS: Uses oxygen prn.    Followup in 1 month with APP.   Loralie Champagne 07/23/2022

## 2022-07-28 ENCOUNTER — Other Ambulatory Visit: Payer: Self-pay | Admitting: Cardiology

## 2022-07-29 DIAGNOSIS — E119 Type 2 diabetes mellitus without complications: Secondary | ICD-10-CM | POA: Diagnosis not present

## 2022-07-29 DIAGNOSIS — I10 Essential (primary) hypertension: Secondary | ICD-10-CM | POA: Diagnosis not present

## 2022-07-29 DIAGNOSIS — R778 Other specified abnormalities of plasma proteins: Secondary | ICD-10-CM | POA: Diagnosis not present

## 2022-07-29 DIAGNOSIS — R519 Headache, unspecified: Secondary | ICD-10-CM | POA: Diagnosis not present

## 2022-08-04 ENCOUNTER — Telehealth (HOSPITAL_COMMUNITY): Payer: Self-pay | Admitting: *Deleted

## 2022-08-04 DIAGNOSIS — R519 Headache, unspecified: Secondary | ICD-10-CM | POA: Diagnosis not present

## 2022-08-04 DIAGNOSIS — R778 Other specified abnormalities of plasma proteins: Secondary | ICD-10-CM | POA: Diagnosis not present

## 2022-08-04 DIAGNOSIS — I10 Essential (primary) hypertension: Secondary | ICD-10-CM | POA: Diagnosis not present

## 2022-08-04 NOTE — Telephone Encounter (Signed)
Lab order faxed to [facility]  Fax numbers [phone]

## 2022-08-09 DIAGNOSIS — I5022 Chronic systolic (congestive) heart failure: Secondary | ICD-10-CM | POA: Diagnosis not present

## 2022-08-17 ENCOUNTER — Other Ambulatory Visit (HOSPITAL_COMMUNITY): Payer: Self-pay | Admitting: Family Medicine

## 2022-08-21 ENCOUNTER — Encounter (HOSPITAL_COMMUNITY): Payer: Self-pay

## 2022-08-21 ENCOUNTER — Ambulatory Visit (HOSPITAL_COMMUNITY)
Admission: RE | Admit: 2022-08-21 | Discharge: 2022-08-21 | Disposition: A | Discharge status: Home / Self Care | Payer: Medicare Other | Source: Ambulatory Visit | Attending: Family Medicine | Admitting: Family Medicine

## 2022-08-21 ENCOUNTER — Other Ambulatory Visit (HOSPITAL_COMMUNITY): Payer: Self-pay

## 2022-08-21 VITALS — BP 162/74 | HR 53 | Wt 209.6 lb

## 2022-08-21 DIAGNOSIS — Z951 Presence of aortocoronary bypass graft: Secondary | ICD-10-CM | POA: Diagnosis not present

## 2022-08-21 DIAGNOSIS — R0609 Other forms of dyspnea: Secondary | ICD-10-CM | POA: Insufficient documentation

## 2022-08-21 DIAGNOSIS — I5032 Chronic diastolic (congestive) heart failure: Secondary | ICD-10-CM | POA: Insufficient documentation

## 2022-08-21 DIAGNOSIS — Z7984 Long term (current) use of oral hypoglycemic drugs: Secondary | ICD-10-CM | POA: Insufficient documentation

## 2022-08-21 DIAGNOSIS — R29818 Other symptoms and signs involving the nervous system: Secondary | ICD-10-CM | POA: Diagnosis not present

## 2022-08-21 DIAGNOSIS — I130 Hypertensive heart and chronic kidney disease with heart failure and stage 1 through stage 4 chronic kidney disease, or unspecified chronic kidney disease: Secondary | ICD-10-CM | POA: Diagnosis not present

## 2022-08-21 DIAGNOSIS — I10 Essential (primary) hypertension: Secondary | ICD-10-CM

## 2022-08-21 DIAGNOSIS — N179 Acute kidney failure, unspecified: Secondary | ICD-10-CM | POA: Insufficient documentation

## 2022-08-21 DIAGNOSIS — Z7982 Long term (current) use of aspirin: Secondary | ICD-10-CM | POA: Insufficient documentation

## 2022-08-21 DIAGNOSIS — R0789 Other chest pain: Secondary | ICD-10-CM | POA: Diagnosis not present

## 2022-08-21 DIAGNOSIS — I2510 Atherosclerotic heart disease of native coronary artery without angina pectoris: Secondary | ICD-10-CM | POA: Insufficient documentation

## 2022-08-21 DIAGNOSIS — N183 Chronic kidney disease, stage 3 unspecified: Secondary | ICD-10-CM | POA: Diagnosis not present

## 2022-08-21 DIAGNOSIS — Z79899 Other long term (current) drug therapy: Secondary | ICD-10-CM | POA: Diagnosis not present

## 2022-08-21 DIAGNOSIS — E662 Morbid (severe) obesity with alveolar hypoventilation: Secondary | ICD-10-CM

## 2022-08-21 MED ORDER — METOLAZONE 2.5 MG PO TABS: 2.5000 mg | ORAL_TABLET | ORAL | 3 refills | Status: DC

## 2022-08-21 MED ORDER — TORSEMIDE 20 MG PO TABS: 80.0000 mg | ORAL_TABLET | Freq: Two times a day (BID) | ORAL | 6 refills | Status: DC

## 2022-08-21 NOTE — Progress Notes (Signed)
PCP: Neale Burly, MD Cardiology: Dr. Harl Bowie HF Cardiology: Dr. Aundra Dubin  76 y.o. with history of CAD, diastolic CHF, and poorly controlled HTN was referred by Dr. Harl Bowie for CHF evaluation. Patient had CABG in 2008.  She has poorly controlled HTN and CKD stage 3.  She was admitted in 10/21 with hypertensive urgency and CHF due to medication noncompliance.  She was admitted again in 3/22 with acute diastolic CHF, AKI to 2.9. HS-TnI was 1777 but cath was not done due to AKI.  Echo showed EF 60-65% with normal RV.  She was again admitted (this time to [facility]) in 5/22 with CHF.  No med changes were made.  Given multiple admission with CHF and difficulty managing volume, she was referred to CHF clinic.  Torsemide was increased and Jardiance was started.   LHC/RHC in 6/22 showed mildly elevated RA pressure, normal PCWP.  LIMA-LAD and SVG-PDA were patent, sequential SVG-OM and ramus only touched down on OM.  Native RCA has a heavily calcified ostium with 95% stenosis, very difficult to seat the catheter due to calcification.  The native PDA is occluded at the ostium, and SVG-PDA only fills the PDA (does not back-fill to PLV and RCA).  Therefore, PLV territory is likely jeopardized by the ostial RCA stenosis. I reviewed the films with Dr. Martin[place].  Potentially could intervene on ostial RCA using atherectomy (would be difficult).  As patient's primary symptoms are CHF-related and this is improved with diuresis, will manage medically now with option for PCI if she develops chest pain, etc.  Follow up 8/23, metolazone 2.5 added weekly to diuretic regimen.  Today she returns for HF follow up. Overall feeling fine. She fdiuresed 7 lbs after taking metolazone and felt poorly so she stopped the medication. She has noticed left ankle swelling has started to increase. She has dyspnea with minimal activity walking around her house with her cane. Denies CP, palpitations, dizziness, or PND/Orthopnea. Appetite ok. No  fever or chills. Weight at home 208 pounds. She fell today after tripping over a cord but did not hit her head. Home BP ~ [id number] systolic  Cardiomems: PADP 16 today, goal 18  ECG (personally reviewed): none ordered today.  Labs (5/22): Pro-BNP 2347, K 4.1, creatinine 1.46 Labs (6/22): K 3.9, creatinine 1.75 Labs (11/22): K 4.3, creatinine 1.5 Labs (5/23): LDL 59 Labs (6/23): K 4.7, creatinine 1.99 Labs (9/23): K 4.9, creatinine 1.75  PMH: 1. Hyperlipidemia 2. HTN 3. CAD: CABG 2008 with LIMA-LAD, SVG-ramus, SVG-OM, SVG-PDA.  - Cardiolite (11/21): Small reversible inferolateral defect.  - LHC (6/22): LIMA-LAD and SVG-PDA were patent, sequential SVG-OM and ramus only touched down on OM.  Native RCA has a heavily calcified ostium with 95% stenosis, very difficult to seat the catheter due to calcification.  The native PDA is occluded at the ostium, and SVG-PDA only fills the PDA (does not back-fill to PLV and RCA).  Therefore, PLV territory is likely jeopardized by the ostial RCA stenosis. I reviewed the films with Dr. Martin[place].  Potentially could intervene on ostial RCA using atherectomy (would be difficult).  As patient's primary symptoms are CHF-related and this is improved with diuresis, will manage medically now with option for PCI if she develops chest pain, etc. 4. Chronic diastolic CHF: Echo ([id number]) with EF 60-65%, normal RV.  - Multiple admission with CHF.  - RHC (6/22): mean RA 10, PA 42/15 mean 25, mean PCWP 13, CI 3.72, PVR 1.7 WU.  - Cardiomems 5. CKD: Stage 3.  SH: Lives in [place], retired from [employer], lives alone, has a son in [place].  Quit smoking in 2003, no ETOH.   Family History  Problem Relation Age of Onset   Cancer Other        Family Hx of Cancer   Coronary artery disease Other        Family Hx of CAD   ROS: All systems reviewed and negative except as per HPI.   Current Outpatient Medications  Medication Sig Dispense Refill    acetaminophen (TYLENOL) 500 MG tablet Take 500-1,000 mg by mouth every 6 (six) hours as needed (pain).     ALPRAZolam (XANAX) 0.5 MG tablet Take 0.5 mg by mouth at bedtime as needed for anxiety or sleep.     aspirin EC 81 MG tablet Take 81 mg by mouth every evening. Swallow whole.     atorvastatin (LIPITOR) 40 MG tablet Take 40 mg by mouth every evening.  0   HUMALOG 100 UNIT/ML injection Inject 0.75 Units into the skin with breakfast, with lunch, and with evening meal. With insulin pump 24 hours adjust sometime when eat     hydrALAZINE (APRESOLINE) 50 MG tablet Take 1.5 tablets (75 mg total) by mouth 3 (three) times daily. 405 tablet 3   ibandronate (BONIVA) 150 MG tablet Take 150 mg by mouth every 30 (thirty) days.     Insulin Disposable Pump (OMNIPOD DASH PODS, GEN 4,) MISC Take by mouth every 3 (three) days.     Insulin Pen Needle (COMFORT TOUCH INSULIN PEN NEED) 31G X 5 MM MISC 1 each by Does not apply route in the morning, at noon, in the evening, and at bedtime. 100 each 0   ipratropium (ATROVENT) 0.03 % nasal spray Place 1 spray into both nostrils 4 (four) times daily as needed (allergies).     isosorbide dinitrate (ISORDIL) 20 MG tablet Take 1 tablet (20 mg total) by mouth 3 (three) times daily. 90 tablet 3   JARDIANCE 10 MG TABS tablet TAKE 1 TABLET BY MOUTH ONCE DAILY BEFORE BREAKFAST 90 tablet 3   levocetirizine (XYZAL) 5 MG tablet Take 5 mg by mouth as needed for allergies.     loratadine (CLARITIN) 10 MG tablet Take 10 mg by mouth daily as needed for allergies.     metoprolol tartrate (LOPRESSOR) 25 MG tablet Take 1/2 (one-half) tablet by mouth twice daily 30 tablet 0   nitroGLYCERIN (NITROSTAT) 0.4 MG SL tablet DISSOLVE ONE TABLET UNDER THE TONGUE EVERY 5 MINUTES AS NEEDED FOR CHEST PAIN.  DO NOT EXCEED A TOTAL OF 3 DOSES IN 15 MINUTES 25 tablet 1   omega-3 fish oil (MAXEPA) 1000 MG CAPS capsule Take 1,000 mg by mouth in the morning.     omeprazole (PRILOSEC) 20 MG capsule Take 20 mg  by mouth in the morning.     ondansetron (ZOFRAN) 4 MG tablet Take 4 mg by mouth every 6 (six) hours as needed for nausea.     OXYGEN Inhale 2 L into the lungs daily as needed (Active).     PE-Triprolidine-DM-GG-APAP (MUCINEX CNG/CGH/COLD/FLU DY/NT PO) Patient takes 2 tablets every 4 hours as needed.     Potassium Chloride (KLOR-CON PO) Patient takes 20 mg tablets by mouth (4 tablets in the morning and evening.     promethazine (PHENERGAN) 25 MG tablet Take 12.5 mg by mouth every 6 (six) hours as needed for vomiting or nausea.     spironolactone (ALDACTONE) 25 MG tablet Take 1 tablet  by mouth once daily 30 tablet 0   traMADol (ULTRAM) 50 MG tablet Take 50 mg by mouth 3 (three) times daily as needed (arthritis pain).     TRELEGY ELLIPTA 200-62.5-25 MCG/ACT AEPB Inhale 1 puff into the lungs daily.     vitamin C (ASCORBIC ACID) 500 MG tablet Take 500 mg by mouth in the morning.     metolazone (ZAROXOLYN) 2.5 MG tablet Take 1 tablet (2.5 mg total) by mouth as directed. Take 2.5 mg every other Monday  with 1st dose 08/21/2022 12 tablet 3   torsemide (DEMADEX) 20 MG tablet Take 4 tablets (80 mg total) by mouth 2 (two) times daily. 240 tablet 6   No current facility-administered medications for this encounter.   Wt Readings from Last 3 Encounters:  08/21/22 95.1 kg (209 lb 9.6 oz)  07/21/22 95.5 kg (210 lb 9.6 oz)  04/19/22 94.9 kg (209 lb 3.2 oz)   BP (!) 162/74   Pulse (!) 53   Wt 95.1 kg (209 lb 9.6 oz)   SpO2 92%   BMI 40.93 kg/m  Physical Exam General:  NAD. No resp difficulty, arrived in [facility] HEENT: Normal Neck: Supple. No JVD. Carotids 2+ bilat; no bruits. No lymphadenopathy or thryomegaly appreciated. Cor: PMI nondisplaced. Regular rate & rhythm. No rubs, gallops or murmurs. Lungs: Clear Abdomen: Obese, soft, nontender, nondistended. No hepatosplenomegaly. No bruits or masses. Good bowel sounds. Extremities: No cyanosis, clubbing, rash, 1+ BLE pre-tibial edema L>R Neuro: Alert &  oriented x 3, cranial nerves grossly intact. Moves all 4 extremities w/o difficulty. Affect pleasant.  Assessment/Plan: 1. Chronic diastolic CHF: Echo in 8/93 with EF 60-65%, normal RV.  She does not have LVH on echo so suspect cardiac amyloidosis is unlikely.  Multiple CHF admissions.  Volume management is complicated by CKD stage 3.  Chronic NYHA class IIIb symptoms. She is not volume overloaded by Cardiomems, she does have LE swelling. - Change metolazone 2.5 mg to every other Monday, take a dose today.  Recent labs stable. Check BMET in 1 week (given Rx for [facility]) - Continue torsemide 80 mg bid. - Continue Jardiance 10 mg daily. No GU symptoms. - Continue spironolactone 25 mg daily.  - Use Cardiomems daily.  2. HTN: BP elevated.  - No change to current medications. 3. CAD: S/p CABG in 2008.  HS-TnI up to 1777 during 3/22 admission but no cath due to AKI.  She has prominent exertional dyspnea but only atypical chest pain.  LHC in 6/22 showed that LIMA-LAD and SVG-PDA were patent, sequential SVG-OM and ramus only touched down on OM.  Native RCA has a heavily calcified ostium with 95% stenosis, very difficult to seat the catheter due to calcification.  The native PDA is occluded at the ostium, and SVG-PDA only fills the PDA (does not back-fill to PLV and RCA).  Therefore, PLV territory is likely jeopardized by the ostial RCA stenosis. I reviewed the films with Dr. Martinique.  Potentially could intervene on ostial RCA using atherectomy (would be difficult).  As patient's primary symptoms are CHF-related and this is improved with diuresis, will manage medically now with option for PCI if she develops chest pain, etc. No chest pain.  - Continue ASA 81 daily.  - Continue atorvastatin 40 mg daily, good lipids 5/23.  - Continue metoprolol 12.5 mg bid.  4. Suspect OSA: She says that she would not wear CPAP, so sleep study not ordered.  5. CKD stage 3: Last creatinine 1.99.  BMET today.  6. OHS: Uses oxygen  prn.    Follow up in 3-4 months with Dr. Wynema Birch [facility] FNP-BC 08/21/2022

## 2022-08-21 NOTE — Patient Instructions (Signed)
Thank you for coming in today  Labs were done today, if any labs are abnormal the clinic will call you No news is good news  CHANGE Metolazone to 2.5 mg every other Monday take 1st dose today 08/21/2022  Your physician recommends that you return for lab work in:  BMET in 7-10 days please take prescription you were given today to your nearest lab [facility]  Your physician recommends that you schedule a follow-up appointment in:  3-4 months with Dr. Aundra Dubin    Do the following things EVERYDAY: Weigh yourself in the morning before breakfast. Write it down and keep it in a log. Take your medicines as prescribed Eat low salt foods--Limit salt (sodium) to 2000 mg per day.  Stay as active as you can everyday Limit all fluids for the day to less than 2 liters  At the [facility], you and your health needs are our priority. As part of our continuing mission to provide you with exceptional heart care, we have created designated Provider Care Teams. These Care Teams include your primary Cardiologist (physician) and Advanced Practice Providers (APPs- Physician Assistants and Nurse Practitioners) who all work together to provide you with the care you need, when you need it.   You may see any of the following providers on your designated Care Team at your next follow up: Dr Glori Bickers Dr Loralie Champagne Dr. Roxana Hires, NP Lyda Jester, [place] [facility] [place], [place] Forestine Na, NP Audry Riles, PharmD   Please be sure to bring in all your medications bottles to every appointment.   If you have any questions or concerns before your next appointment please send [place] a message through [employer] or call our office at [phone].    TO LEAVE A MESSAGE FOR THE NURSE SELECT OPTION 2, PLEASE LEAVE A MESSAGE INCLUDING: YOUR NAME DATE OF BIRTH CALL BACK NUMBER REASON FOR CALL**this is important as we prioritize the call backs  YOU WILL RECEIVE A CALL  BACK THE SAME DAY AS LONG AS YOU CALL BEFORE 4:00 PM

## 2022-08-28 DIAGNOSIS — R519 Headache, unspecified: Secondary | ICD-10-CM | POA: Diagnosis not present

## 2022-08-28 DIAGNOSIS — I10 Essential (primary) hypertension: Secondary | ICD-10-CM | POA: Diagnosis not present

## 2022-08-28 DIAGNOSIS — R778 Other specified abnormalities of plasma proteins: Secondary | ICD-10-CM | POA: Diagnosis not present

## 2022-08-28 DIAGNOSIS — E119 Type 2 diabetes mellitus without complications: Secondary | ICD-10-CM | POA: Diagnosis not present

## 2022-09-01 DIAGNOSIS — I5042 Chronic combined systolic (congestive) and diastolic (congestive) heart failure: Secondary | ICD-10-CM | POA: Diagnosis not present

## 2022-09-03 DIAGNOSIS — I10 Essential (primary) hypertension: Secondary | ICD-10-CM | POA: Diagnosis not present

## 2022-09-03 DIAGNOSIS — R778 Other specified abnormalities of plasma proteins: Secondary | ICD-10-CM | POA: Diagnosis not present

## 2022-09-03 DIAGNOSIS — R519 Headache, unspecified: Secondary | ICD-10-CM | POA: Diagnosis not present

## 2022-09-08 NOTE — Telephone Encounter (Signed)
error 

## 2022-09-28 DIAGNOSIS — R778 Other specified abnormalities of plasma proteins: Secondary | ICD-10-CM | POA: Diagnosis not present

## 2022-09-28 DIAGNOSIS — E119 Type 2 diabetes mellitus without complications: Secondary | ICD-10-CM | POA: Diagnosis not present

## 2022-09-28 DIAGNOSIS — I10 Essential (primary) hypertension: Secondary | ICD-10-CM | POA: Diagnosis not present

## 2022-09-28 DIAGNOSIS — R519 Headache, unspecified: Secondary | ICD-10-CM | POA: Diagnosis not present

## 2022-10-02 DIAGNOSIS — M5442 Lumbago with sciatica, left side: Secondary | ICD-10-CM | POA: Diagnosis not present

## 2022-10-02 DIAGNOSIS — E1121 Type 2 diabetes mellitus with diabetic nephropathy: Secondary | ICD-10-CM | POA: Diagnosis not present

## 2022-10-02 DIAGNOSIS — I5022 Chronic systolic (congestive) heart failure: Secondary | ICD-10-CM | POA: Diagnosis not present

## 2022-10-02 DIAGNOSIS — I10 Essential (primary) hypertension: Secondary | ICD-10-CM | POA: Diagnosis not present

## 2022-10-04 DIAGNOSIS — R778 Other specified abnormalities of plasma proteins: Secondary | ICD-10-CM | POA: Diagnosis not present

## 2022-10-04 DIAGNOSIS — R519 Headache, unspecified: Secondary | ICD-10-CM | POA: Diagnosis not present

## 2022-10-04 DIAGNOSIS — I10 Essential (primary) hypertension: Secondary | ICD-10-CM | POA: Diagnosis not present

## 2022-10-05 ENCOUNTER — Other Ambulatory Visit (HOSPITAL_COMMUNITY): Payer: Self-pay | Admitting: Family Medicine

## 2022-10-06 DIAGNOSIS — I5022 Chronic systolic (congestive) heart failure: Secondary | ICD-10-CM | POA: Diagnosis not present

## 2022-10-06 DIAGNOSIS — E1121 Type 2 diabetes mellitus with diabetic nephropathy: Secondary | ICD-10-CM | POA: Diagnosis not present

## 2022-10-06 DIAGNOSIS — M5442 Lumbago with sciatica, left side: Secondary | ICD-10-CM | POA: Diagnosis not present

## 2022-10-06 DIAGNOSIS — I10 Essential (primary) hypertension: Secondary | ICD-10-CM | POA: Diagnosis not present

## 2022-10-12 ENCOUNTER — Telehealth (HOSPITAL_COMMUNITY): Payer: Self-pay

## 2022-10-12 NOTE — Telephone Encounter (Signed)
Patient wants to know if she should continue taking metolazone since she is noe CKD stage 4. She currently takes it every other week. Please advise

## 2022-10-28 DIAGNOSIS — R778 Other specified abnormalities of plasma proteins: Secondary | ICD-10-CM | POA: Diagnosis not present

## 2022-10-28 DIAGNOSIS — I10 Essential (primary) hypertension: Secondary | ICD-10-CM | POA: Diagnosis not present

## 2022-10-28 DIAGNOSIS — E119 Type 2 diabetes mellitus without complications: Secondary | ICD-10-CM | POA: Diagnosis not present

## 2022-10-28 DIAGNOSIS — R519 Headache, unspecified: Secondary | ICD-10-CM | POA: Diagnosis not present

## 2022-11-03 DIAGNOSIS — R778 Other specified abnormalities of plasma proteins: Secondary | ICD-10-CM | POA: Diagnosis not present

## 2022-11-03 DIAGNOSIS — R519 Headache, unspecified: Secondary | ICD-10-CM | POA: Diagnosis not present

## 2022-11-03 DIAGNOSIS — I10 Essential (primary) hypertension: Secondary | ICD-10-CM | POA: Diagnosis not present

## 2022-11-14 ENCOUNTER — Encounter (HOSPITAL_COMMUNITY): Payer: Self-pay | Admitting: Cardiology

## 2022-11-14 ENCOUNTER — Ambulatory Visit (HOSPITAL_COMMUNITY)
Admission: RE | Admit: 2022-11-14 | Discharge: 2022-11-14 | Disposition: A | Discharge status: Home / Self Care | Payer: Medicare Other | Source: Ambulatory Visit | Attending: Cardiology | Admitting: Cardiology

## 2022-11-14 VITALS — BP 140/60 | HR 50 | Wt 211.4 lb

## 2022-11-14 DIAGNOSIS — I130 Hypertensive heart and chronic kidney disease with heart failure and stage 1 through stage 4 chronic kidney disease, or unspecified chronic kidney disease: Secondary | ICD-10-CM | POA: Insufficient documentation

## 2022-11-14 DIAGNOSIS — R0609 Other forms of dyspnea: Secondary | ICD-10-CM | POA: Diagnosis not present

## 2022-11-14 DIAGNOSIS — R0789 Other chest pain: Secondary | ICD-10-CM | POA: Diagnosis not present

## 2022-11-14 DIAGNOSIS — I5032 Chronic diastolic (congestive) heart failure: Secondary | ICD-10-CM | POA: Insufficient documentation

## 2022-11-14 DIAGNOSIS — M7989 Other specified soft tissue disorders: Secondary | ICD-10-CM | POA: Diagnosis not present

## 2022-11-14 DIAGNOSIS — I2510 Atherosclerotic heart disease of native coronary artery without angina pectoris: Secondary | ICD-10-CM | POA: Insufficient documentation

## 2022-11-14 DIAGNOSIS — Z7982 Long term (current) use of aspirin: Secondary | ICD-10-CM | POA: Insufficient documentation

## 2022-11-14 DIAGNOSIS — R001 Bradycardia, unspecified: Secondary | ICD-10-CM | POA: Diagnosis not present

## 2022-11-14 DIAGNOSIS — N183 Chronic kidney disease, stage 3 unspecified: Secondary | ICD-10-CM | POA: Insufficient documentation

## 2022-11-14 DIAGNOSIS — Z79899 Other long term (current) drug therapy: Secondary | ICD-10-CM | POA: Insufficient documentation

## 2022-11-14 DIAGNOSIS — Z951 Presence of aortocoronary bypass graft: Secondary | ICD-10-CM | POA: Diagnosis not present

## 2022-11-14 DIAGNOSIS — N179 Acute kidney failure, unspecified: Secondary | ICD-10-CM | POA: Insufficient documentation

## 2022-11-14 LAB — BASIC METABOLIC PANEL
Anion gap: 13 (ref 5–15)
BUN: 21 mg/dL (ref 8–23)
CO2: 27 mmol/L (ref 22–32)
Calcium: 8.3 mg/dL — ABNORMAL LOW (ref 8.9–10.3)
Chloride: 97 mmol/L — ABNORMAL LOW (ref 98–111)
Creatinine, Ser: 1.65 mg/dL — ABNORMAL HIGH (ref 0.44–1.00)
GFR, Estimated: 32 mL/min — ABNORMAL LOW (ref >60)
Glucose, Bld: 52 mg/dL — ABNORMAL LOW (ref 70–99)
Potassium: 3.5 mmol/L (ref 3.5–5.1)
Sodium: 137 mmol/L (ref 135–145)

## 2022-11-14 LAB — LIPID PANEL
Cholesterol: 110 mg/dL (ref 0–200)
HDL: 32 mg/dL — ABNORMAL LOW (ref >40)
LDL Cholesterol: 60 mg/dL (ref 0–99)
Total CHOL/HDL Ratio: 3.4 RATIO
Triglycerides: 92 mg/dL (ref <150)
VLDL: 18 mg/dL (ref 0–40)

## 2022-11-14 LAB — BRAIN NATRIURETIC PEPTIDE: B Natriuretic Peptide: 396.3 pg/mL — ABNORMAL HIGH (ref 0.0–100.0)

## 2022-11-14 MED ORDER — METOLAZONE 2.5 MG PO TABS: 2.5000 mg | ORAL_TABLET | ORAL | 3 refills | Status: DC

## 2022-11-14 NOTE — Progress Notes (Signed)
PCP: Toma Deiters, MD Cardiology: Dr. Wyline Mood HF Cardiology: Dr. Shirlee Latch  76 y.o. with history of CAD, diastolic CHF, and poorly controlled HTN was referred by Dr. Wyline Mood for CHF evaluation. Patient had CABG in 2008.  She has poorly controlled HTN and CKD stage 3.  She was admitted in 10/21 with hypertensive urgency and CHF due to medication noncompliance.  She was admitted again in 3/22 with acute diastolic CHF, AKI to 2.9. HS-TnI was 1777 but cath was not done due to AKI.  Echo showed EF 60-65% with normal RV.  She was again admitted (this time to [facility]) in 5/22 with CHF.  No med changes were made.  Given multiple admission with CHF and difficulty managing volume, she was referred to CHF clinic.  Torsemide was increased and Jardiance was started.   LHC/RHC in 6/22 showed mildly elevated RA pressure, normal PCWP.  LIMA-LAD and SVG-PDA were patent, sequential SVG-OM and ramus only touched down on OM.  Native RCA has a heavily calcified ostium with 95% stenosis, very difficult to seat the catheter due to calcification.  The native PDA is occluded at the ostium, and SVG-PDA only fills the PDA (does not back-fill to PLV and RCA).  Therefore, PLV territory is likely jeopardized by the ostial RCA stenosis. I reviewed the films with Dr. [place].  Potentially could intervene on ostial RCA using atherectomy (would be difficult).  As patient's primary symptoms have been CHF-related and this was improved with diuresis, will manage medically now with option for PCI if she develops chest pain, etc.  Follow up 8/23, metolazone 2.5 added weekly to diuretic regimen then later decreased to every other week.  Today she returns for HF follow up. Weight up 2 lbs.  She is not very active but does some walking with cane.  Left lower leg has been swelling > right lower leg for several months.  This has not been evaluated by ultrasound. Dyspnea waxes and wanes.  On some days, she is short of breath with housework and on  other days she is not.  She is out of breath after walking through several stores when she goes shopping.   Cardiomems: PADP 14 today, goal 18  ECG (personally reviewed): NSR, lateral TWIs  Labs (5/22): Pro-BNP 2347, K 4.1, creatinine 1.46 Labs (6/22): K 3.9, creatinine 1.75 Labs (11/22): K 4.3, creatinine 1.5 Labs (5/23): LDL 59 Labs (6/23): K 4.7, creatinine 1.99 Labs (9/23): K 4.9, creatinine 1.75 Labs (10/23): K 4.2, creatinine 1.85  PMH: 1. Hyperlipidemia 2. HTN 3. CAD: CABG 2008 with LIMA-LAD, SVG-ramus, SVG-OM, SVG-PDA.  - Cardiolite (11/21): Small reversible inferolateral defect.  - LHC (6/22): LIMA-LAD and SVG-PDA were patent, sequential SVG-OM and ramus only touched down on OM.  Native RCA has a heavily calcified ostium with 95% stenosis, very difficult to seat the catheter due to calcification.  The native PDA is occluded at the ostium, and SVG-PDA only fills the PDA (does not back-fill to PLV and RCA).  Therefore, PLV territory is likely jeopardized by the ostial RCA stenosis. I reviewed the films with Dr. [place].  Potentially could intervene on ostial RCA using atherectomy (would be difficult).  As patient's primary symptoms are CHF-related and this is improved with diuresis, will manage medically now with option for PCI if she develops chest pain, etc. 4. Chronic diastolic CHF: Echo (3/22) with EF 60-65%, normal RV.  - Multiple admission with CHF.  - RHC (6/22): mean RA 10, PA 42/15 mean 25, mean PCWP 13,  CI 3.72, PVR 1.7 WU.  - Cardiomems 5. CKD: Stage 3.   SH: Lives in [place], retired from [employer], lives alone, has a son in [place].  Quit smoking in 2003, no ETOH.   Family History  Problem Relation Age of Onset   Cancer Other        Family Hx of Cancer   Coronary artery disease Other        Family Hx of CAD   ROS: All systems reviewed and negative except as per HPI.   Current Outpatient Medications  Medication Sig Dispense Refill    acetaminophen (TYLENOL) 500 MG tablet Take 500-1,000 mg by mouth every 6 (six) hours as needed (pain).     ALPRAZolam (XANAX) 0.5 MG tablet Take 0.5 mg by mouth at bedtime as needed for anxiety or sleep.     aspirin EC 81 MG tablet Take 81 mg by mouth every evening. Swallow whole.     atorvastatin (LIPITOR) 40 MG tablet Take 40 mg by mouth every evening.  0   dapagliflozin propanediol (FARXIGA) 10 MG TABS tablet Take 10 mg by mouth daily.     denosumab (PROLIA) 60 MG/ML SOSY injection Inject 60 mg into the skin every 6 (six) months.     HUMALOG 100 UNIT/ML injection Inject 0.75 Units into the skin with breakfast, with lunch, and with evening meal. With insulin pump 24 hours adjust sometime when eat     hydrALAZINE (APRESOLINE) 50 MG tablet Take 1.5 tablets (75 mg total) by mouth 3 (three) times daily. 405 tablet 3   Insulin Disposable Pump (OMNIPOD DASH PODS, GEN 4,) MISC Take by mouth every 3 (three) days.     Insulin Pen Needle (COMFORT TOUCH INSULIN PEN NEED) 31G X 5 MM MISC 1 each by Does not apply route in the morning, at noon, in the evening, and at bedtime. 100 each 0   ipratropium (ATROVENT) 0.03 % nasal spray Place 1 spray into both nostrils 4 (four) times daily as needed (allergies).     isosorbide dinitrate (ISORDIL) 20 MG tablet TAKE 1 TABLET BY MOUTH 3 TIMES  DAILY 300 tablet 2   levocetirizine (XYZAL) 5 MG tablet Take 5 mg by mouth as needed for allergies.     loratadine (CLARITIN) 10 MG tablet Take 10 mg by mouth daily as needed for allergies.     metoprolol tartrate (LOPRESSOR) 25 MG tablet Take 1/2 (one-half) tablet by mouth twice daily 30 tablet 0   nitroGLYCERIN (NITROSTAT) 0.4 MG SL tablet DISSOLVE ONE TABLET UNDER THE TONGUE EVERY 5 MINUTES AS NEEDED FOR CHEST PAIN.  DO NOT EXCEED A TOTAL OF 3 DOSES IN 15 MINUTES 25 tablet 1   omega-3 fish oil (MAXEPA) 1000 MG CAPS capsule Take 1,000 mg by mouth in the morning.     omeprazole (PRILOSEC) 20 MG capsule Take 20 mg by mouth in the  morning.     ondansetron (ZOFRAN) 4 MG tablet Take 4 mg by mouth every 6 (six) hours as needed for nausea.     OXYGEN Inhale 2 L into the lungs daily as needed (Active).     PE-Triprolidine-DM-GG-APAP (MUCINEX CNG/CGH/COLD/FLU DY/NT PO) Patient takes 2 tablets every 4 hours as needed.     Potassium Chloride (KLOR-CON PO) Patient takes 20 mg tablets by mouth (4 tablets in the morning and evening.     promethazine (PHENERGAN) 25 MG tablet Take 12.5 mg by mouth every 6 (six) hours as needed for vomiting or nausea.  spironolactone (ALDACTONE) 25 MG tablet Take 1 tablet by mouth once daily 30 tablet 0   torsemide (DEMADEX) 20 MG tablet Take 4 tablets (80 mg total) by mouth 2 (two) times daily. 240 tablet 6   traMADol (ULTRAM) 50 MG tablet Take 50 mg by mouth 3 (three) times daily as needed (arthritis pain).     TRELEGY ELLIPTA 200-62.5-25 MCG/ACT AEPB Inhale 1 puff into the lungs daily.     vitamin C (ASCORBIC ACID) 500 MG tablet Take 500 mg by mouth in the morning.     metolazone (ZAROXOLYN) 2.5 MG tablet Take 1 tablet (2.5 mg total) by mouth once a week. 12 tablet 3   No current facility-administered medications for this encounter.   Wt Readings from Last 3 Encounters:  11/14/22 95.9 kg (211 lb 6.4 oz)  08/21/22 95.1 kg (209 lb 9.6 oz)  07/21/22 95.5 kg (210 lb 9.6 oz)   BP (!) 140/60   Pulse (!) 50   Wt 95.9 kg (211 lb 6.4 oz)   SpO2 94%   BMI 41.29 kg/m  General: NAD Neck: JVP 10 cm, no thyromegaly or thyroid nodule.  Lungs: Clear to auscultation bilaterally with normal respiratory effort. CV: Nondisplaced PMI.  Heart regular S1/S2, no S3/S4, no murmur.  1+ edema 1/2 to knee on left.  No carotid bruit.  Normal pedal pulses.  Abdomen: Soft, nontender, no hepatosplenomegaly, no distention.  Skin: Intact without lesions or rashes.  Neurologic: Alert and oriented x 3.  Psych: Normal affect. Extremities: No clubbing or cyanosis.  HEENT: Normal.   Assessment/Plan: 1. Chronic  diastolic CHF: Echo in 3/22 with EF 60-65%, normal RV.  She does not have LVH on echo so suspect cardiac amyloidosis is unlikely.  Multiple CHF admissions.  Volume management is complicated by CKD stage 3.  Chronic NYHA class III symptoms. PA diastolic pressure not elevated by Cardiomems, but on exam she does appear to have predominantly RV failure with volume overload.  - Increase metolazone to 2.5 mg once weekly.  BMET/BNP today and again in 2 wks.  - Continue torsemide 80 mg bid. - Continue Farxiga 10 mg daily.  - Continue spironolactone 25 mg daily.  2. HTN: Borderline elevated BP.  - No change to current medications. 3. CAD: S/p CABG in 2008.  HS-TnI up to 1777 during 3/22 admission but no cath due to AKI.  She has prominent exertional dyspnea but only atypical chest pain.  LHC in 6/22 showed that LIMA-LAD and SVG-PDA were patent, sequential SVG-OM and ramus only touched down on OM.  Native RCA has a heavily calcified ostium with 95% stenosis, very difficult to seat the catheter due to calcification.  The native PDA is occluded at the ostium, and SVG-PDA only fills the PDA (does not back-fill to PLV and RCA).  Therefore, PLV territory is likely jeopardized by the ostial RCA stenosis. I reviewed the films with Dr. [place].  Potentially could intervene on ostial RCA using atherectomy (would be difficult).  As patient's primary symptoms are CHF-related, will manage medically now with option for PCI if she develops chest pain, etc. No chest pain.  - Continue ASA 81 daily.  - Continue atorvastatin 40 mg daily, check lipids.   - Continue metoprolol 12.5 mg bid.  4. Suspect OSA: She says that she would not wear CPAP, so sleep study not ordered.  5. CKD stage 3: Last creatinine 1.85.  BMET today.  6. OHS: Uses oxygen prn.   7. Asymmetric left lower leg swelling:  This has been present for her report for several month.  - I will arrange lower extremity venous dopplers to assess for DVT.   Follow up in 1  month with APP.   Marca Ancona  11/14/2022

## 2022-11-14 NOTE — Patient Instructions (Signed)
INCREASE Metolazone to 2.5mg  weekly.  Labs done today, your results will be available in MyChart, we will contact you for abnormal readings.  Repeat blood work in 10 days at [facility].  Your physician has requested that you have an echocardiogram. Echocardiography is a painless test that uses sound waves to create images of your heart. It provides your doctor with information about the size and shape of your heart and how well your heart's chambers and valves are working. This procedure takes approximately one hour. There are no restrictions for this procedure. Please do NOT wear cologne, perfume, aftershave, or lotions (deodorant is allowed). Please arrive 15 minutes prior to your appointment time. YOU WILL BE CALLED TO HAVE THIS TEST ARRANGED.  Your provider has order Dopplers of your left leg. You will be called to have this test arranged.  Your physician recommends that you schedule a follow-up appointment in: 1 month  If you have any questions or concerns before your next appointment please send [place] a message through [place] or call our office at [phone].    TO LEAVE A MESSAGE FOR THE NURSE SELECT OPTION 2, PLEASE LEAVE A MESSAGE INCLUDING: YOUR NAME DATE OF BIRTH CALL BACK NUMBER REASON FOR CALL**this is important as we prioritize the call backs  YOU WILL RECEIVE A CALL BACK THE SAME DAY AS LONG AS YOU CALL BEFORE 4:00 PM  At the [facility], you and your health needs are our priority. As part of our continuing mission to provide you with exceptional heart care, we have created designated Provider Care Teams. These Care Teams include your primary Cardiologist (physician) and Advanced Practice Providers (APPs- Physician Assistants and Nurse Practitioners) who all work together to provide you with the care you need, when you need it.   You may see any of the following providers on your designated Care Team at your next follow up: Dr Arvilla Meres Dr  Marca Ancona Dr. Marcos Eke, NP Robbie Lis, [place] [facility] [place], [place] Brynda Peon, NP Karle Plumber, PharmD   Please be sure to bring in all your medications bottles to every appointment.

## 2022-11-23 ENCOUNTER — Other Ambulatory Visit (HOSPITAL_COMMUNITY): Payer: Self-pay | Admitting: Cardiology

## 2022-11-23 ENCOUNTER — Telehealth (HOSPITAL_COMMUNITY): Payer: Self-pay | Admitting: Cardiology

## 2022-11-23 DIAGNOSIS — M7989 Other specified soft tissue disorders: Secondary | ICD-10-CM

## 2022-11-23 DIAGNOSIS — I5032 Chronic diastolic (congestive) heart failure: Secondary | ICD-10-CM

## 2022-11-23 NOTE — Telephone Encounter (Signed)
Pt called to request lab order for repeat labs at lab corrp  Orders placed and released

## 2022-11-24 DIAGNOSIS — I5032 Chronic diastolic (congestive) heart failure: Secondary | ICD-10-CM | POA: Diagnosis not present

## 2022-11-25 LAB — BASIC METABOLIC PANEL
BUN/Creatinine Ratio: 16 (ref 12–28)
BUN: 27 mg/dL (ref 8–27)
CO2: 30 mmol/L — ABNORMAL HIGH (ref 20–29)
Calcium: 9 mg/dL (ref 8.7–10.3)
Chloride: 94 mmol/L — ABNORMAL LOW (ref 96–106)
Creatinine, Ser: 1.71 mg/dL — ABNORMAL HIGH (ref 0.57–1.00)
Glucose: 101 mg/dL — ABNORMAL HIGH (ref 70–99)
Potassium: 4.3 mmol/L (ref 3.5–5.2)
Sodium: 140 mmol/L (ref 134–144)
eGFR: 31 mL/min/{1.73_m2} — ABNORMAL LOW (ref >59)

## 2022-11-26 DIAGNOSIS — E1121 Type 2 diabetes mellitus with diabetic nephropathy: Secondary | ICD-10-CM | POA: Diagnosis not present

## 2022-11-26 DIAGNOSIS — I10 Essential (primary) hypertension: Secondary | ICD-10-CM | POA: Diagnosis not present

## 2022-11-26 DIAGNOSIS — N184 Chronic kidney disease, stage 4 (severe): Secondary | ICD-10-CM | POA: Diagnosis not present

## 2022-11-26 DIAGNOSIS — I5022 Chronic systolic (congestive) heart failure: Secondary | ICD-10-CM | POA: Diagnosis not present

## 2022-11-26 LAB — BRAIN NATRIURETIC PEPTIDE: BNP: 245 pg/mL — ABNORMAL HIGH (ref 0.0–100.0)

## 2022-12-04 ENCOUNTER — Other Ambulatory Visit: Payer: Medicare Other

## 2022-12-05 ENCOUNTER — Ambulatory Visit (INDEPENDENT_AMBULATORY_CARE_PROVIDER_SITE_OTHER): Payer: PPO

## 2022-12-05 ENCOUNTER — Ambulatory Visit: Payer: PPO | Attending: Cardiology

## 2022-12-05 DIAGNOSIS — M7989 Other specified soft tissue disorders: Secondary | ICD-10-CM

## 2022-12-05 DIAGNOSIS — I5032 Chronic diastolic (congestive) heart failure: Secondary | ICD-10-CM

## 2022-12-05 LAB — ECHOCARDIOGRAM COMPLETE
AR max vel: 2.1 cm2
AV Area VTI: 2.21 cm2
AV Area mean vel: 2.2 cm2
AV Mean grad: 3.5 mmHg
AV Peak grad: 6.6 mmHg
Ao pk vel: 1.28 m/s
Area-P 1/2: 4.8 cm2
Calc EF: 69.9 %
MV M vel: 3.9 m/s
MV Peak grad: 60.8 mmHg
S' Lateral: 2.9 cm
Single Plane A2C EF: 67.6 %
Single Plane A4C EF: 71.8 %

## 2022-12-11 ENCOUNTER — Other Ambulatory Visit (HOSPITAL_COMMUNITY): Payer: Self-pay | Admitting: *Deleted

## 2022-12-11 MED ORDER — TORSEMIDE 20 MG PO TABS: 80.0000 mg | ORAL_TABLET | Freq: Two times a day (BID) | ORAL | 6 refills | Status: DC

## 2022-12-15 ENCOUNTER — Ambulatory Visit (HOSPITAL_COMMUNITY)
Admission: RE | Admit: 2022-12-15 | Discharge: 2022-12-15 | Disposition: A | Discharge status: Home / Self Care | Payer: PPO | Source: Ambulatory Visit | Attending: Family Medicine | Admitting: Family Medicine

## 2022-12-15 ENCOUNTER — Encounter (HOSPITAL_COMMUNITY): Payer: Self-pay

## 2022-12-15 VITALS — BP 100/72 | HR 55 | Wt 212.0 lb

## 2022-12-15 DIAGNOSIS — M7989 Other specified soft tissue disorders: Secondary | ICD-10-CM | POA: Diagnosis not present

## 2022-12-15 DIAGNOSIS — I2510 Atherosclerotic heart disease of native coronary artery without angina pectoris: Secondary | ICD-10-CM | POA: Diagnosis not present

## 2022-12-15 DIAGNOSIS — N183 Chronic kidney disease, stage 3 unspecified: Secondary | ICD-10-CM | POA: Diagnosis not present

## 2022-12-15 DIAGNOSIS — I10 Essential (primary) hypertension: Secondary | ICD-10-CM | POA: Diagnosis not present

## 2022-12-15 DIAGNOSIS — Z79899 Other long term (current) drug therapy: Secondary | ICD-10-CM | POA: Diagnosis not present

## 2022-12-15 DIAGNOSIS — I5032 Chronic diastolic (congestive) heart failure: Secondary | ICD-10-CM

## 2022-12-15 DIAGNOSIS — R0789 Other chest pain: Secondary | ICD-10-CM | POA: Insufficient documentation

## 2022-12-15 DIAGNOSIS — Z87891 Personal history of nicotine dependence: Secondary | ICD-10-CM | POA: Diagnosis not present

## 2022-12-15 DIAGNOSIS — R0609 Other forms of dyspnea: Secondary | ICD-10-CM | POA: Diagnosis not present

## 2022-12-15 DIAGNOSIS — E662 Morbid (severe) obesity with alveolar hypoventilation: Secondary | ICD-10-CM

## 2022-12-15 DIAGNOSIS — R29818 Other symptoms and signs involving the nervous system: Secondary | ICD-10-CM | POA: Diagnosis not present

## 2022-12-15 DIAGNOSIS — Z951 Presence of aortocoronary bypass graft: Secondary | ICD-10-CM | POA: Insufficient documentation

## 2022-12-15 DIAGNOSIS — Z7984 Long term (current) use of oral hypoglycemic drugs: Secondary | ICD-10-CM | POA: Insufficient documentation

## 2022-12-15 DIAGNOSIS — I130 Hypertensive heart and chronic kidney disease with heart failure and stage 1 through stage 4 chronic kidney disease, or unspecified chronic kidney disease: Secondary | ICD-10-CM | POA: Insufficient documentation

## 2022-12-15 DIAGNOSIS — N179 Acute kidney failure, unspecified: Secondary | ICD-10-CM | POA: Insufficient documentation

## 2022-12-15 DIAGNOSIS — E785 Hyperlipidemia, unspecified: Secondary | ICD-10-CM | POA: Diagnosis not present

## 2022-12-15 DIAGNOSIS — Z7982 Long term (current) use of aspirin: Secondary | ICD-10-CM | POA: Diagnosis not present

## 2022-12-15 LAB — BASIC METABOLIC PANEL
Anion gap: 11 (ref 5–15)
BUN: 27 mg/dL — ABNORMAL HIGH (ref 8–23)
CO2: 30 mmol/L (ref 22–32)
Calcium: 8.5 mg/dL — ABNORMAL LOW (ref 8.9–10.3)
Chloride: 94 mmol/L — ABNORMAL LOW (ref 98–111)
Creatinine, Ser: 1.64 mg/dL — ABNORMAL HIGH (ref 0.44–1.00)
GFR, Estimated: 33 mL/min — ABNORMAL LOW (ref >60)
Glucose, Bld: 88 mg/dL (ref 70–99)
Potassium: 3.8 mmol/L (ref 3.5–5.1)
Sodium: 135 mmol/L (ref 135–145)

## 2022-12-15 LAB — BRAIN NATRIURETIC PEPTIDE: B Natriuretic Peptide: 463.3 pg/mL — ABNORMAL HIGH (ref 0.0–100.0)

## 2022-12-15 NOTE — Patient Instructions (Signed)
Thank you for coming in today  Labs were done today, if any labs are abnormal the clinic will call you No news is good news  Your physician recommends that you schedule a follow-up appointment in:  3-4 months with Dr. Mclean. You will receive a reminder letter in the mail a few months in advance. If you don't receive a letter, please call our office to schedule the follow-up appointment.     Do the following things EVERYDAY: Weigh yourself in the morning before breakfast. Write it down and keep it in a log. Take your medicines as prescribed Eat low salt foods--Limit salt (sodium) to 2000 mg per day.  Stay as active as you can everyday Limit all fluids for the day to less than 2 liters  At the [facility], you and your health needs are our priority. As part of our continuing mission to provide you with exceptional heart care, we have created designated Provider Care Teams. These Care Teams include your primary Cardiologist (physician) and Advanced Practice Providers (APPs- Physician Assistants and Nurse Practitioners) who all work together to provide you with the care you need, when you need it.   You may see any of the following providers on your designated Care Team at your next follow up: Dr Daniel Bensimhon Dr Dalton McLean Dr. Aditya Sabharwal Amy Clegg, NP Brittainy Simmons, PA Jessica Milford,NP Lindsay Finch, PA Alma Diaz, NP Lauren Kemp, PharmD   Please be sure to bring in all your medications bottles to every appointment.   If you have any questions or concerns before your next appointment please send us a message through mychart or call our office at [phone].    TO LEAVE A MESSAGE FOR THE NURSE SELECT OPTION 2, PLEASE LEAVE A MESSAGE INCLUDING: YOUR NAME DATE OF BIRTH CALL BACK NUMBER REASON FOR CALL**this is important as we prioritize the call backs  YOU WILL RECEIVE A CALL BACK THE SAME DAY AS LONG AS YOU CALL BEFORE 4:00 PM  

## 2022-12-15 NOTE — Progress Notes (Signed)
PCP: Toma Deiters, MD Cardiology: Dr. Wyline Mood HF Cardiology: Dr. Shirlee Latch  77 y.o. with history of CAD, diastolic CHF, and poorly controlled HTN was referred by Dr. Wyline Mood for CHF evaluation. Patient had CABG in 2008.  She has poorly controlled HTN and CKD stage 3.  She was admitted in 10/21 with hypertensive urgency and CHF due to medication noncompliance.  She was admitted again in 3/22 with acute diastolic CHF, AKI to 2.9. HS-TnI was 1777 but cath was not done due to AKI.  Echo showed EF 60-65% with normal RV.  She was again admitted (this time to [facility]) in 5/22 with CHF.  No med changes were made.  Given multiple admission with CHF and difficulty managing volume, she was referred to CHF clinic.  Torsemide was increased and Jardiance was started.   LHC/RHC in 6/22 showed mildly elevated RA pressure, normal PCWP.  LIMA-LAD and SVG-PDA were patent, sequential SVG-OM and ramus only touched down on OM.  Native RCA has a heavily calcified ostium with 95% stenosis, very difficult to seat the catheter due to calcification.  The native PDA is occluded at the ostium, and SVG-PDA only fills the PDA (does not back-fill to PLV and RCA).  Therefore, PLV territory is likely jeopardized by the ostial RCA stenosis. I reviewed the films with Dr. [place].  Potentially could intervene on ostial RCA using atherectomy (would be difficult).  As patient's primary symptoms have been CHF-related and this was improved with diuresis, will manage medically now with option for PCI if she develops chest pain, etc.  Follow up 8/23, metolazone 2.5 added weekly to diuretic regimen then later decreased to every other week.  Follow up 12/23, mild volume overload and metolazone added weekly. LE dopplers arranged for LLE swelling, which were negative for DVT.  Today she returns for HF follow up. Overall feeling fine. She feels better on once a week metolazone. She wears 2L with activity. She has SOB with bathing and walking  further distances on flat ground. Denies palpitations, CP, dizziness, edema, or PND/Orthopnea. She chronically sleeps reclined. Appetite ok. No fever or chills. Weight at home 206 pounds. Taking all medications. She enjoys crochet and her dog, Rascal.  Cardiomems: PADP 17 today, goal 18  ECG (personally reviewed): none ordered today.  Labs (5/22): Pro-BNP 2347, K 4.1, creatinine 1.46 Labs (6/22): K 3.9, creatinine 1.75 Labs (11/22): K 4.3, creatinine 1.5 Labs (5/23): LDL 59 Labs (6/23): K 4.7, creatinine 1.99 Labs (9/23): K 4.9, creatinine 1.75 Labs (10/23): K 4.2, creatinine 1.85 Labs (12/23): K 4.3, creatinine 1.71, LDL 60  PMH: 1. Hyperlipidemia 2. HTN 3. CAD: CABG 2008 with LIMA-LAD, SVG-ramus, SVG-OM, SVG-PDA.  - Cardiolite (11/21): Small reversible inferolateral defect.  - LHC (6/22): LIMA-LAD and SVG-PDA were patent, sequential SVG-OM and ramus only touched down on OM.  Native RCA has a heavily calcified ostium with 95% stenosis, very difficult to seat the catheter due to calcification.  The native PDA is occluded at the ostium, and SVG-PDA only fills the PDA (does not back-fill to PLV and RCA).  Therefore, PLV territory is likely jeopardized by the ostial RCA stenosis. I reviewed the films with Dr. [place].  Potentially could intervene on ostial RCA using atherectomy (would be difficult).  As patient's primary symptoms are CHF-related and this is improved with diuresis, will manage medically now with option for PCI if she develops chest pain, etc. 4. Chronic diastolic CHF: Echo (3/22) with EF 60-65%, normal RV.  - Multiple admission with CHF.  -  RHC (6/22): mean RA 10, PA 42/15 mean 25, mean PCWP 13, CI 3.72, PVR 1.7 WU.  - Cardiomems 5. CKD: Stage 3.   SH: Lives in [place], retired from [employer], lives alone, has a son in [place].  Quit smoking in 2003, no ETOH.   Family History  Problem Relation Age of Onset   Cancer Other        Family Hx of Cancer    Coronary artery disease Other        Family Hx of CAD   ROS: All systems reviewed and negative except as per HPI.   Current Outpatient Medications  Medication Sig Dispense Refill   acetaminophen (TYLENOL) 500 MG tablet Take 500-1,000 mg by mouth every 6 (six) hours as needed (pain).     ALPRAZolam (XANAX) 0.5 MG tablet Take 0.5 mg by mouth at bedtime as needed for anxiety or sleep.     aspirin EC 81 MG tablet Take 81 mg by mouth every evening. Swallow whole.     atorvastatin (LIPITOR) 40 MG tablet Take 40 mg by mouth every evening.  0   dapagliflozin propanediol (FARXIGA) 10 MG TABS tablet Take 10 mg by mouth daily.     denosumab (PROLIA) 60 MG/ML SOSY injection Inject 60 mg into the skin every 6 (six) months.     HUMALOG 100 UNIT/ML injection Inject 0.75 Units into the skin with breakfast, with lunch, and with evening meal. With insulin pump 24 hours adjust sometime when eat     hydrALAZINE (APRESOLINE) 50 MG tablet Take 1.5 tablets (75 mg total) by mouth 3 (three) times daily. 405 tablet 3   Insulin Disposable Pump (OMNIPOD DASH PODS, GEN 4,) MISC Take by mouth every 3 (three) days.     Insulin Pen Needle (COMFORT TOUCH INSULIN PEN NEED) 31G X 5 MM MISC 1 each by Does not apply route in the morning, at noon, in the evening, and at bedtime. 100 each 0   ipratropium (ATROVENT) 0.03 % nasal spray Place 1 spray into both nostrils 4 (four) times daily as needed (allergies).     isosorbide dinitrate (ISORDIL) 20 MG tablet TAKE 1 TABLET BY MOUTH 3 TIMES  DAILY 300 tablet 2   levocetirizine (XYZAL) 5 MG tablet Take 5 mg by mouth as needed for allergies.     loratadine (CLARITIN) 10 MG tablet Take 10 mg by mouth daily as needed for allergies.     metolazone (ZAROXOLYN) 2.5 MG tablet Take 1 tablet (2.5 mg total) by mouth once a week. 12 tablet 3   metoprolol tartrate (LOPRESSOR) 25 MG tablet Take 1/2 (one-half) tablet by mouth twice daily 30 tablet 0   nitroGLYCERIN (NITROSTAT) 0.4 MG SL tablet  DISSOLVE ONE TABLET UNDER THE TONGUE EVERY 5 MINUTES AS NEEDED FOR CHEST PAIN.  DO NOT EXCEED A TOTAL OF 3 DOSES IN 15 MINUTES 25 tablet 1   omega-3 fish oil (MAXEPA) 1000 MG CAPS capsule Take 1,000 mg by mouth in the morning.     omeprazole (PRILOSEC) 20 MG capsule Take 20 mg by mouth in the morning.     ondansetron (ZOFRAN) 4 MG tablet Take 4 mg by mouth every 6 (six) hours as needed for nausea.     OXYGEN Inhale 2 L into the lungs daily as needed (Active).     PE-Triprolidine-DM-GG-APAP (MUCINEX CNG/CGH/COLD/FLU DY/NT PO) Patient takes 2 tablets every 4 hours as needed.     Potassium Chloride (KLOR-CON PO) Patient takes 20 mg tablets by  mouth (4 tablets in the morning and evening.     promethazine (PHENERGAN) 25 MG tablet Take 12.5 mg by mouth every 6 (six) hours as needed for vomiting or nausea.     spironolactone (ALDACTONE) 25 MG tablet Take 1 tablet by mouth once daily 30 tablet 0   torsemide (DEMADEX) 20 MG tablet Take 4 tablets (80 mg total) by mouth 2 (two) times daily. 240 tablet 6   traMADol (ULTRAM) 50 MG tablet Take 50 mg by mouth 3 (three) times daily as needed (arthritis pain).     TRELEGY ELLIPTA 200-62.5-25 MCG/ACT AEPB Inhale 1 puff into the lungs daily.     vitamin C (ASCORBIC ACID) 500 MG tablet Take 500 mg by mouth in the morning.     No current facility-administered medications for this encounter.   Wt Readings from Last 3 Encounters:  12/15/22 96.2 kg (212 lb)  11/14/22 95.9 kg (211 lb 6.4 oz)  08/21/22 95.1 kg (209 lb 9.6 oz)   BP 100/72   Pulse (!) 55   Wt 96.2 kg (212 lb)   SpO2 93%   BMI 41.40 kg/m  Physical Exam General:  NAD. No resp difficulty, walked into clinic on 2L oxygen HEENT: Normal Neck: Supple. No JVD. Carotids 2+ bilat; no bruits. No lymphadenopathy or thryomegaly appreciated. Cor: PMI nondisplaced. Regular rate & rhythm. No rubs, gallops or murmurs. Lungs: Clear Abdomen: Obese, nontender, nondistended. No hepatosplenomegaly. No bruits or  masses. Good bowel sounds. Extremities: No cyanosis, clubbing, rash, 1+ LLE pre-tibial edema Neuro: Alert & oriented x 3, cranial nerves grossly intact. Moves all 4 extremities w/o difficulty. Affect pleasant.  Assessment/Plan: 1. Chronic diastolic CHF: Echo in 3/22 with EF 60-65%, normal RV.  She does not have LVH on echo so suspect cardiac amyloidosis is unlikely.  Multiple CHF admissions.  Volume management is complicated by CKD stage 3.  Chronic NYHA class III symptoms, improved recently. PA diastolic pressure not elevated by Cardiomems, she is not volume overloaded on exam.  - Continue metolazone to 2.5 mg once weekly.  BMET/BNP today. - Continue torsemide 80 mg bid. - Continue Farxiga 10 mg daily.  - Continue spironolactone 25 mg daily.  2. HTN: Well-controlled today. - No change to current medications. 3. CAD: S/p CABG in 2008.  HS-TnI up to 1777 during 3/22 admission but no cath due to AKI.  She has prominent exertional dyspnea but only atypical chest pain.  LHC in 6/22 showed that LIMA-LAD and SVG-PDA were patent, sequential SVG-OM and ramus only touched down on OM.  Native RCA has a heavily calcified ostium with 95% stenosis, very difficult to seat the catheter due to calcification.  The native PDA is occluded at the ostium, and SVG-PDA only fills the PDA (does not back-fill to PLV and RCA).  Therefore, PLV territory is likely jeopardized by the ostial RCA stenosis. I reviewed the films with Dr. [place].  Potentially could intervene on ostial RCA using atherectomy (would be difficult).  As patient's primary symptoms are CHF-related, will manage medically now with option for PCI if she develops chest pain, etc. No chest pain.  - Continue ASA 81 daily.  - Continue atorvastatin 40 mg daily, good lipids 12/23.  - Continue metoprolol 12.5 mg bid.  4. Suspect OSA: She says that she would not wear CPAP, so sleep study not ordered.  5. CKD stage 3: Last creatinine 1.85.  BMET today.  6. OHS: Uses  oxygen prn.   7. Asymmetric left lower leg swelling: This  has been present for her report for several month.  - Lower extremity venous dopplers (1/24) showed no evidence of DCT.  Follow up in 3-4 months with Dr. Aundra Dubin.  Maricela Bo [facility] FNP-BC 12/15/2022

## 2022-12-26 DIAGNOSIS — E1121 Type 2 diabetes mellitus with diabetic nephropathy: Secondary | ICD-10-CM | POA: Diagnosis not present

## 2022-12-26 DIAGNOSIS — I5022 Chronic systolic (congestive) heart failure: Secondary | ICD-10-CM | POA: Diagnosis not present

## 2022-12-26 DIAGNOSIS — I10 Essential (primary) hypertension: Secondary | ICD-10-CM | POA: Diagnosis not present

## 2022-12-26 DIAGNOSIS — N184 Chronic kidney disease, stage 4 (severe): Secondary | ICD-10-CM | POA: Diagnosis not present

## 2023-01-01 DIAGNOSIS — N184 Chronic kidney disease, stage 4 (severe): Secondary | ICD-10-CM | POA: Diagnosis not present

## 2023-01-01 DIAGNOSIS — I10 Essential (primary) hypertension: Secondary | ICD-10-CM | POA: Diagnosis not present

## 2023-01-01 DIAGNOSIS — Z0000 Encounter for general adult medical examination without abnormal findings: Secondary | ICD-10-CM | POA: Diagnosis not present

## 2023-01-01 DIAGNOSIS — M5442 Lumbago with sciatica, left side: Secondary | ICD-10-CM | POA: Diagnosis not present

## 2023-01-01 DIAGNOSIS — Z6841 Body Mass Index (BMI) 40.0 and over, adult: Secondary | ICD-10-CM | POA: Diagnosis not present

## 2023-01-01 DIAGNOSIS — E1121 Type 2 diabetes mellitus with diabetic nephropathy: Secondary | ICD-10-CM | POA: Diagnosis not present

## 2023-01-01 DIAGNOSIS — I5022 Chronic systolic (congestive) heart failure: Secondary | ICD-10-CM | POA: Diagnosis not present

## 2023-01-18 ENCOUNTER — Other Ambulatory Visit (HOSPITAL_COMMUNITY): Payer: Self-pay | Admitting: Family Medicine

## 2023-01-26 ENCOUNTER — Other Ambulatory Visit (HOSPITAL_COMMUNITY): Payer: Self-pay | Admitting: Cardiology

## 2023-02-15 DIAGNOSIS — Z794 Long term (current) use of insulin: Secondary | ICD-10-CM | POA: Diagnosis not present

## 2023-02-15 DIAGNOSIS — Z961 Presence of intraocular lens: Secondary | ICD-10-CM | POA: Diagnosis not present

## 2023-02-15 DIAGNOSIS — E109 Type 1 diabetes mellitus without complications: Secondary | ICD-10-CM | POA: Diagnosis not present

## 2023-02-15 DIAGNOSIS — H40013 Open angle with borderline findings, low risk, bilateral: Secondary | ICD-10-CM | POA: Diagnosis not present

## 2023-02-15 DIAGNOSIS — H26493 Other secondary cataract, bilateral: Secondary | ICD-10-CM | POA: Diagnosis not present

## 2023-03-02 ENCOUNTER — Other Ambulatory Visit (HOSPITAL_COMMUNITY): Payer: Self-pay | Admitting: Cardiology

## 2023-03-26 DIAGNOSIS — F330 Major depressive disorder, recurrent, mild: Secondary | ICD-10-CM | POA: Diagnosis not present

## 2023-03-26 DIAGNOSIS — J9611 Chronic respiratory failure with hypoxia: Secondary | ICD-10-CM | POA: Diagnosis not present

## 2023-03-26 DIAGNOSIS — I4891 Unspecified atrial fibrillation: Secondary | ICD-10-CM | POA: Diagnosis not present

## 2023-03-26 DIAGNOSIS — E1122 Type 2 diabetes mellitus with diabetic chronic kidney disease: Secondary | ICD-10-CM | POA: Diagnosis not present

## 2023-03-26 DIAGNOSIS — E662 Morbid (severe) obesity with alveolar hypoventilation: Secondary | ICD-10-CM | POA: Diagnosis not present

## 2023-03-26 DIAGNOSIS — D6869 Other thrombophilia: Secondary | ICD-10-CM | POA: Diagnosis not present

## 2023-03-26 DIAGNOSIS — E261 Secondary hyperaldosteronism: Secondary | ICD-10-CM | POA: Diagnosis not present

## 2023-03-26 DIAGNOSIS — J449 Chronic obstructive pulmonary disease, unspecified: Secondary | ICD-10-CM | POA: Diagnosis not present

## 2023-03-26 DIAGNOSIS — I25119 Atherosclerotic heart disease of native coronary artery with unspecified angina pectoris: Secondary | ICD-10-CM | POA: Diagnosis not present

## 2023-03-26 DIAGNOSIS — I700 Atherosclerosis of aorta: Secondary | ICD-10-CM | POA: Diagnosis not present

## 2023-03-26 DIAGNOSIS — N184 Chronic kidney disease, stage 4 (severe): Secondary | ICD-10-CM | POA: Diagnosis not present

## 2023-03-26 DIAGNOSIS — I110 Hypertensive heart disease with heart failure: Secondary | ICD-10-CM | POA: Diagnosis not present

## 2023-04-02 DIAGNOSIS — E1121 Type 2 diabetes mellitus with diabetic nephropathy: Secondary | ICD-10-CM | POA: Diagnosis not present

## 2023-04-02 DIAGNOSIS — N184 Chronic kidney disease, stage 4 (severe): Secondary | ICD-10-CM | POA: Diagnosis not present

## 2023-04-02 DIAGNOSIS — M5442 Lumbago with sciatica, left side: Secondary | ICD-10-CM | POA: Diagnosis not present

## 2023-04-02 DIAGNOSIS — I10 Essential (primary) hypertension: Secondary | ICD-10-CM | POA: Diagnosis not present

## 2023-04-02 DIAGNOSIS — J9611 Chronic respiratory failure with hypoxia: Secondary | ICD-10-CM | POA: Diagnosis not present

## 2023-04-02 DIAGNOSIS — Z0000 Encounter for general adult medical examination without abnormal findings: Secondary | ICD-10-CM | POA: Diagnosis not present

## 2023-04-02 DIAGNOSIS — Z6839 Body mass index (BMI) 39.0-39.9, adult: Secondary | ICD-10-CM | POA: Diagnosis not present

## 2023-04-02 DIAGNOSIS — I5022 Chronic systolic (congestive) heart failure: Secondary | ICD-10-CM | POA: Diagnosis not present

## 2023-04-16 ENCOUNTER — Other Ambulatory Visit (HOSPITAL_COMMUNITY): Payer: Self-pay | Admitting: Cardiology

## 2023-04-16 ENCOUNTER — Encounter (INDEPENDENT_AMBULATORY_CARE_PROVIDER_SITE_OTHER): Payer: Self-pay | Admitting: *Deleted

## 2023-04-29 DIAGNOSIS — R519 Headache, unspecified: Secondary | ICD-10-CM | POA: Diagnosis not present

## 2023-04-29 DIAGNOSIS — E119 Type 2 diabetes mellitus without complications: Secondary | ICD-10-CM | POA: Diagnosis not present

## 2023-04-29 DIAGNOSIS — I10 Essential (primary) hypertension: Secondary | ICD-10-CM | POA: Diagnosis not present

## 2023-04-29 DIAGNOSIS — R778 Other specified abnormalities of plasma proteins: Secondary | ICD-10-CM | POA: Diagnosis not present

## 2023-05-05 DIAGNOSIS — R519 Headache, unspecified: Secondary | ICD-10-CM | POA: Diagnosis not present

## 2023-05-05 DIAGNOSIS — R778 Other specified abnormalities of plasma proteins: Secondary | ICD-10-CM | POA: Diagnosis not present

## 2023-05-05 DIAGNOSIS — I10 Essential (primary) hypertension: Secondary | ICD-10-CM | POA: Diagnosis not present

## 2023-05-11 ENCOUNTER — Other Ambulatory Visit (HOSPITAL_COMMUNITY): Payer: Self-pay | Admitting: Family Medicine

## 2023-05-29 DIAGNOSIS — R778 Other specified abnormalities of plasma proteins: Secondary | ICD-10-CM | POA: Diagnosis not present

## 2023-05-29 DIAGNOSIS — I10 Essential (primary) hypertension: Secondary | ICD-10-CM | POA: Diagnosis not present

## 2023-05-29 DIAGNOSIS — R519 Headache, unspecified: Secondary | ICD-10-CM | POA: Diagnosis not present

## 2023-05-29 DIAGNOSIS — E119 Type 2 diabetes mellitus without complications: Secondary | ICD-10-CM | POA: Diagnosis not present

## 2023-06-04 DIAGNOSIS — I10 Essential (primary) hypertension: Secondary | ICD-10-CM | POA: Diagnosis not present

## 2023-06-04 DIAGNOSIS — R519 Headache, unspecified: Secondary | ICD-10-CM | POA: Diagnosis not present

## 2023-06-04 DIAGNOSIS — R778 Other specified abnormalities of plasma proteins: Secondary | ICD-10-CM | POA: Diagnosis not present

## 2023-06-17 ENCOUNTER — Other Ambulatory Visit (HOSPITAL_COMMUNITY): Payer: Self-pay | Admitting: Family Medicine

## 2023-06-21 DIAGNOSIS — Z1231 Encounter for screening mammogram for malignant neoplasm of breast: Secondary | ICD-10-CM | POA: Diagnosis not present

## 2023-06-29 DIAGNOSIS — R519 Headache, unspecified: Secondary | ICD-10-CM | POA: Diagnosis not present

## 2023-06-29 DIAGNOSIS — E119 Type 2 diabetes mellitus without complications: Secondary | ICD-10-CM | POA: Diagnosis not present

## 2023-06-29 DIAGNOSIS — I10 Essential (primary) hypertension: Secondary | ICD-10-CM | POA: Diagnosis not present

## 2023-06-29 DIAGNOSIS — R778 Other specified abnormalities of plasma proteins: Secondary | ICD-10-CM | POA: Diagnosis not present

## 2023-07-02 DIAGNOSIS — E1121 Type 2 diabetes mellitus with diabetic nephropathy: Secondary | ICD-10-CM | POA: Diagnosis not present

## 2023-07-02 DIAGNOSIS — I10 Essential (primary) hypertension: Secondary | ICD-10-CM | POA: Diagnosis not present

## 2023-07-02 DIAGNOSIS — Z6839 Body mass index (BMI) 39.0-39.9, adult: Secondary | ICD-10-CM | POA: Diagnosis not present

## 2023-07-02 DIAGNOSIS — N1832 Chronic kidney disease, stage 3b: Secondary | ICD-10-CM | POA: Diagnosis not present

## 2023-07-02 DIAGNOSIS — I5022 Chronic systolic (congestive) heart failure: Secondary | ICD-10-CM | POA: Diagnosis not present

## 2023-07-02 DIAGNOSIS — M5442 Lumbago with sciatica, left side: Secondary | ICD-10-CM | POA: Diagnosis not present

## 2023-07-02 DIAGNOSIS — J9611 Chronic respiratory failure with hypoxia: Secondary | ICD-10-CM | POA: Diagnosis not present

## 2023-07-02 DIAGNOSIS — I700 Atherosclerosis of aorta: Secondary | ICD-10-CM | POA: Diagnosis not present

## 2023-07-05 DIAGNOSIS — R519 Headache, unspecified: Secondary | ICD-10-CM | POA: Diagnosis not present

## 2023-07-05 DIAGNOSIS — I10 Essential (primary) hypertension: Secondary | ICD-10-CM | POA: Diagnosis not present

## 2023-07-05 DIAGNOSIS — R778 Other specified abnormalities of plasma proteins: Secondary | ICD-10-CM | POA: Diagnosis not present

## 2023-07-28 ENCOUNTER — Other Ambulatory Visit (HOSPITAL_COMMUNITY): Payer: Self-pay | Admitting: Family Medicine

## 2023-07-30 DIAGNOSIS — E119 Type 2 diabetes mellitus without complications: Secondary | ICD-10-CM | POA: Diagnosis not present

## 2023-07-30 DIAGNOSIS — R778 Other specified abnormalities of plasma proteins: Secondary | ICD-10-CM | POA: Diagnosis not present

## 2023-07-30 DIAGNOSIS — I10 Essential (primary) hypertension: Secondary | ICD-10-CM | POA: Diagnosis not present

## 2023-07-30 DIAGNOSIS — R519 Headache, unspecified: Secondary | ICD-10-CM | POA: Diagnosis not present

## 2023-08-05 DIAGNOSIS — R778 Other specified abnormalities of plasma proteins: Secondary | ICD-10-CM | POA: Diagnosis not present

## 2023-08-05 DIAGNOSIS — I10 Essential (primary) hypertension: Secondary | ICD-10-CM | POA: Diagnosis not present

## 2023-08-05 DIAGNOSIS — R519 Headache, unspecified: Secondary | ICD-10-CM | POA: Diagnosis not present

## 2023-08-29 DIAGNOSIS — R519 Headache, unspecified: Secondary | ICD-10-CM | POA: Diagnosis not present

## 2023-08-29 DIAGNOSIS — E119 Type 2 diabetes mellitus without complications: Secondary | ICD-10-CM | POA: Diagnosis not present

## 2023-08-29 DIAGNOSIS — R778 Other specified abnormalities of plasma proteins: Secondary | ICD-10-CM | POA: Diagnosis not present

## 2023-08-29 DIAGNOSIS — I10 Essential (primary) hypertension: Secondary | ICD-10-CM | POA: Diagnosis not present

## 2023-09-01 ENCOUNTER — Other Ambulatory Visit (HOSPITAL_COMMUNITY): Payer: Self-pay | Admitting: Family Medicine

## 2023-09-04 DIAGNOSIS — I10 Essential (primary) hypertension: Secondary | ICD-10-CM | POA: Diagnosis not present

## 2023-09-04 DIAGNOSIS — R519 Headache, unspecified: Secondary | ICD-10-CM | POA: Diagnosis not present

## 2023-09-04 DIAGNOSIS — R778 Other specified abnormalities of plasma proteins: Secondary | ICD-10-CM | POA: Diagnosis not present

## 2023-09-23 ENCOUNTER — Other Ambulatory Visit (HOSPITAL_COMMUNITY): Payer: Self-pay | Admitting: Family Medicine

## 2023-09-27 ENCOUNTER — Encounter (INDEPENDENT_AMBULATORY_CARE_PROVIDER_SITE_OTHER): Payer: Self-pay | Admitting: *Deleted

## 2023-09-27 ENCOUNTER — Other Ambulatory Visit (HOSPITAL_COMMUNITY): Payer: Self-pay | Admitting: Family Medicine

## 2023-09-29 DIAGNOSIS — R778 Other specified abnormalities of plasma proteins: Secondary | ICD-10-CM | POA: Diagnosis not present

## 2023-09-29 DIAGNOSIS — E119 Type 2 diabetes mellitus without complications: Secondary | ICD-10-CM | POA: Diagnosis not present

## 2023-09-29 DIAGNOSIS — R519 Headache, unspecified: Secondary | ICD-10-CM | POA: Diagnosis not present

## 2023-09-29 DIAGNOSIS — I10 Essential (primary) hypertension: Secondary | ICD-10-CM | POA: Diagnosis not present

## 2023-10-01 DIAGNOSIS — I700 Atherosclerosis of aorta: Secondary | ICD-10-CM | POA: Diagnosis not present

## 2023-10-01 DIAGNOSIS — E1121 Type 2 diabetes mellitus with diabetic nephropathy: Secondary | ICD-10-CM | POA: Diagnosis not present

## 2023-10-01 DIAGNOSIS — K5909 Other constipation: Secondary | ICD-10-CM | POA: Diagnosis not present

## 2023-10-01 DIAGNOSIS — N1832 Chronic kidney disease, stage 3b: Secondary | ICD-10-CM | POA: Diagnosis not present

## 2023-10-01 DIAGNOSIS — M5442 Lumbago with sciatica, left side: Secondary | ICD-10-CM | POA: Diagnosis not present

## 2023-10-01 DIAGNOSIS — I10 Essential (primary) hypertension: Secondary | ICD-10-CM | POA: Diagnosis not present

## 2023-10-01 DIAGNOSIS — I5022 Chronic systolic (congestive) heart failure: Secondary | ICD-10-CM | POA: Diagnosis not present

## 2023-10-01 DIAGNOSIS — J9611 Chronic respiratory failure with hypoxia: Secondary | ICD-10-CM | POA: Diagnosis not present

## 2023-10-01 DIAGNOSIS — Z6841 Body Mass Index (BMI) 40.0 and over, adult: Secondary | ICD-10-CM | POA: Diagnosis not present

## 2023-10-05 DIAGNOSIS — E1121 Type 2 diabetes mellitus with diabetic nephropathy: Secondary | ICD-10-CM | POA: Diagnosis not present

## 2023-10-05 DIAGNOSIS — I700 Atherosclerosis of aorta: Secondary | ICD-10-CM | POA: Diagnosis not present

## 2023-10-05 DIAGNOSIS — I10 Essential (primary) hypertension: Secondary | ICD-10-CM | POA: Diagnosis not present

## 2023-10-05 DIAGNOSIS — R1900 Intra-abdominal and pelvic swelling, mass and lump, unspecified site: Secondary | ICD-10-CM | POA: Diagnosis not present

## 2023-10-05 DIAGNOSIS — I5022 Chronic systolic (congestive) heart failure: Secondary | ICD-10-CM | POA: Diagnosis not present

## 2023-10-05 DIAGNOSIS — R778 Other specified abnormalities of plasma proteins: Secondary | ICD-10-CM | POA: Diagnosis not present

## 2023-10-05 DIAGNOSIS — K5909 Other constipation: Secondary | ICD-10-CM | POA: Diagnosis not present

## 2023-10-05 DIAGNOSIS — J9611 Chronic respiratory failure with hypoxia: Secondary | ICD-10-CM | POA: Diagnosis not present

## 2023-10-05 DIAGNOSIS — R109 Unspecified abdominal pain: Secondary | ICD-10-CM | POA: Diagnosis not present

## 2023-10-05 DIAGNOSIS — R519 Headache, unspecified: Secondary | ICD-10-CM | POA: Diagnosis not present

## 2023-10-05 DIAGNOSIS — N1832 Chronic kidney disease, stage 3b: Secondary | ICD-10-CM | POA: Diagnosis not present

## 2023-10-05 DIAGNOSIS — M5442 Lumbago with sciatica, left side: Secondary | ICD-10-CM | POA: Diagnosis not present

## 2023-10-09 ENCOUNTER — Telehealth (HOSPITAL_COMMUNITY): Payer: Self-pay | Admitting: Adult Health

## 2023-10-09 NOTE — Telephone Encounter (Signed)
  Cardiomems Remote Monitoring  S/P Cardiomems Implant   PAD Goal: 18 Most recent reading: 22 Suggestive of fluid accumulation  Recommended changes: Take an extra 20 mg torsemide for the next 2 days. Please set up follow up within 3-4 weeks. Check BMET at that time.   I continue to review and analyze the patients PA pressures weekly (and more often as needed) to bring PA pressures within the optimal range.    Please call with above recommendations.   Diondre Pulis NP-C  3:31 PM

## 2023-10-10 NOTE — Telephone Encounter (Signed)
Patient advised and verbalized understanding. Appt scheduled

## 2023-10-19 ENCOUNTER — Other Ambulatory Visit (HOSPITAL_COMMUNITY): Payer: Self-pay | Admitting: Family Medicine

## 2023-10-22 ENCOUNTER — Other Ambulatory Visit (HOSPITAL_COMMUNITY): Payer: Self-pay | Admitting: Cardiology

## 2023-10-22 MED ORDER — TORSEMIDE 20 MG PO TABS: 80.0000 mg | ORAL_TABLET | Freq: Two times a day (BID) | ORAL | 3 refills | Status: DC

## 2023-10-29 DIAGNOSIS — I10 Essential (primary) hypertension: Secondary | ICD-10-CM | POA: Diagnosis not present

## 2023-10-29 DIAGNOSIS — R519 Headache, unspecified: Secondary | ICD-10-CM | POA: Diagnosis not present

## 2023-10-29 DIAGNOSIS — R778 Other specified abnormalities of plasma proteins: Secondary | ICD-10-CM | POA: Diagnosis not present

## 2023-10-29 DIAGNOSIS — E119 Type 2 diabetes mellitus without complications: Secondary | ICD-10-CM | POA: Diagnosis not present

## 2023-11-02 ENCOUNTER — Other Ambulatory Visit (HOSPITAL_COMMUNITY): Payer: Self-pay | Admitting: Cardiology

## 2023-11-04 DIAGNOSIS — R778 Other specified abnormalities of plasma proteins: Secondary | ICD-10-CM | POA: Diagnosis not present

## 2023-11-04 DIAGNOSIS — R519 Headache, unspecified: Secondary | ICD-10-CM | POA: Diagnosis not present

## 2023-11-04 DIAGNOSIS — I10 Essential (primary) hypertension: Secondary | ICD-10-CM | POA: Diagnosis not present

## 2023-11-09 ENCOUNTER — Encounter (HOSPITAL_COMMUNITY): Payer: PPO

## 2023-11-09 ENCOUNTER — Ambulatory Visit (HOSPITAL_COMMUNITY)
Admission: RE | Admit: 2023-11-09 | Discharge: 2023-11-09 | Disposition: A | Discharge status: Home / Self Care | Payer: PPO | Source: Ambulatory Visit | Attending: Cardiology | Admitting: Cardiology

## 2023-11-09 VITALS — BP 158/76 | HR 52 | Wt 209.5 lb

## 2023-11-09 DIAGNOSIS — Z951 Presence of aortocoronary bypass graft: Secondary | ICD-10-CM | POA: Diagnosis not present

## 2023-11-09 DIAGNOSIS — R0789 Other chest pain: Secondary | ICD-10-CM | POA: Diagnosis not present

## 2023-11-09 DIAGNOSIS — I5032 Chronic diastolic (congestive) heart failure: Secondary | ICD-10-CM | POA: Diagnosis not present

## 2023-11-09 DIAGNOSIS — Z794 Long term (current) use of insulin: Secondary | ICD-10-CM | POA: Insufficient documentation

## 2023-11-09 DIAGNOSIS — E119 Type 2 diabetes mellitus without complications: Secondary | ICD-10-CM | POA: Diagnosis not present

## 2023-11-09 DIAGNOSIS — N1831 Chronic kidney disease, stage 3a: Secondary | ICD-10-CM | POA: Diagnosis not present

## 2023-11-09 DIAGNOSIS — E1122 Type 2 diabetes mellitus with diabetic chronic kidney disease: Secondary | ICD-10-CM | POA: Insufficient documentation

## 2023-11-09 DIAGNOSIS — G4733 Obstructive sleep apnea (adult) (pediatric): Secondary | ICD-10-CM

## 2023-11-09 DIAGNOSIS — Z79899 Other long term (current) drug therapy: Secondary | ICD-10-CM | POA: Diagnosis not present

## 2023-11-09 DIAGNOSIS — E669 Obesity, unspecified: Secondary | ICD-10-CM | POA: Diagnosis not present

## 2023-11-09 DIAGNOSIS — Z7982 Long term (current) use of aspirin: Secondary | ICD-10-CM | POA: Insufficient documentation

## 2023-11-09 DIAGNOSIS — I2510 Atherosclerotic heart disease of native coronary artery without angina pectoris: Secondary | ICD-10-CM | POA: Insufficient documentation

## 2023-11-09 DIAGNOSIS — I159 Secondary hypertension, unspecified: Secondary | ICD-10-CM

## 2023-11-09 DIAGNOSIS — I130 Hypertensive heart and chronic kidney disease with heart failure and stage 1 through stage 4 chronic kidney disease, or unspecified chronic kidney disease: Secondary | ICD-10-CM | POA: Insufficient documentation

## 2023-11-09 DIAGNOSIS — Z6841 Body Mass Index (BMI) 40.0 and over, adult: Secondary | ICD-10-CM | POA: Diagnosis not present

## 2023-11-09 DIAGNOSIS — Z87891 Personal history of nicotine dependence: Secondary | ICD-10-CM | POA: Diagnosis not present

## 2023-11-09 DIAGNOSIS — Z9641 Presence of insulin pump (external) (internal): Secondary | ICD-10-CM | POA: Insufficient documentation

## 2023-11-09 DIAGNOSIS — M7989 Other specified soft tissue disorders: Secondary | ICD-10-CM | POA: Diagnosis not present

## 2023-11-09 DIAGNOSIS — R0609 Other forms of dyspnea: Secondary | ICD-10-CM | POA: Insufficient documentation

## 2023-11-09 DIAGNOSIS — N183 Chronic kidney disease, stage 3 unspecified: Secondary | ICD-10-CM | POA: Insufficient documentation

## 2023-11-09 LAB — BASIC METABOLIC PANEL
Anion gap: 10 (ref 5–15)
BUN: 32 mg/dL — ABNORMAL HIGH (ref 8–23)
CO2: 29 mmol/L (ref 22–32)
Calcium: 9.1 mg/dL (ref 8.9–10.3)
Chloride: 97 mmol/L — ABNORMAL LOW (ref 98–111)
Creatinine, Ser: 1.76 mg/dL — ABNORMAL HIGH (ref 0.44–1.00)
GFR, Estimated: 30 mL/min — ABNORMAL LOW (ref >60)
Glucose, Bld: 125 mg/dL — ABNORMAL HIGH (ref 70–99)
Potassium: 3.8 mmol/L (ref 3.5–5.1)
Sodium: 136 mmol/L (ref 135–145)

## 2023-11-09 LAB — BRAIN NATRIURETIC PEPTIDE: B Natriuretic Peptide: 539.3 pg/mL — ABNORMAL HIGH (ref 0.0–100.0)

## 2023-11-09 LAB — LIPID PANEL
Cholesterol: 123 mg/dL (ref 0–200)
HDL: 40 mg/dL — ABNORMAL LOW (ref >40)
LDL Cholesterol: 65 mg/dL (ref 0–99)
Total CHOL/HDL Ratio: 3.1 {ratio}
Triglycerides: 88 mg/dL (ref <150)
VLDL: 18 mg/dL (ref 0–40)

## 2023-11-09 LAB — HEMOGLOBIN A1C
Hgb A1c MFr Bld: 7.8 % — ABNORMAL HIGH (ref 4.8–5.6)
Mean Plasma Glucose: 177.16 mg/dL

## 2023-11-09 MED ORDER — CARVEDILOL 3.125 MG PO TABS: 3.1250 mg | ORAL_TABLET | Freq: Two times a day (BID) | ORAL | 6 refills | Status: DC

## 2023-11-09 MED ORDER — TORSEMIDE 20 MG PO TABS: 100.0000 mg | ORAL_TABLET | Freq: Two times a day (BID) | ORAL | 6 refills | Status: DC

## 2023-11-09 MED ORDER — POTASSIUM CHLORIDE CRYS ER 20 MEQ PO TBCR: 100.0000 meq | EXTENDED_RELEASE_TABLET | Freq: Two times a day (BID) | ORAL | 6 refills | Status: DC

## 2023-11-09 NOTE — Progress Notes (Signed)
PCP: Toma Deiters, MD Cardiology: Dr. Wyline Mood HF Cardiology: Dr. Shirlee Latch  77 y.o. with history of CAD, diastolic CHF, and poorly controlled HTN was referred by Dr. Wyline Mood for CHF evaluation. Patient had CABG in 2008.  She has poorly controlled HTN and CKD stage 3.  She was admitted in 10/21 with hypertensive urgency and CHF due to medication noncompliance.  She was admitted again in 3/22 with acute diastolic CHF, AKI to 2.9. HS-TnI was 1777 but cath was not done due to AKI.  Echo showed EF 60-65% with normal RV.  She was again admitted (this time to [facility]) in 5/22 with CHF.  No med changes were made.  Given multiple admission with CHF and difficulty managing volume, she was referred to CHF clinic.  Torsemide was increased and Jardiance was started.   LHC/RHC in 6/22 showed mildly elevated RA pressure, normal PCWP.  LIMA-LAD and SVG-PDA were patent, sequential SVG-OM and ramus only touched down on OM.  Native RCA has a heavily calcified ostium with 95% stenosis, very difficult to seat the catheter due to calcification.  The native PDA is occluded at the ostium, and SVG-PDA only fills the PDA (does not back-fill to PLV and RCA).  Therefore, PLV territory is likely jeopardized by the ostial RCA stenosis. I reviewed the films with Dr. [place].  Potentially could intervene on ostial RCA using atherectomy (would be difficult).  As patient's primary symptoms have been CHF-related and this was improved with diuresis, will manage medically now with option for PCI if she develops chest pain, etc.  Follow up 8/23, metolazone 2.5 added weekly to diuretic regimen then later decreased to every other week.  Follow up 12/23, mild volume overload and metolazone added weekly. LE dopplers arranged for LLE swelling, which were negative for DVT.  Today she returns for HF follow up. Overall feeling fine. SOB with long distances, using a cane when outside the house. Still able to drive. No CP, palpitations, dizziness.  Has noticed some edema L>R in her legs. Not noticing a big difference when taking er metolazone. Sleeping on 4 pillows at home with coughing in the morning. Appetite okay, eating 1-2 meals/day. Weighs 209 lb at home.  Cardiomems today: PADP 20, goal 18   ECG (personally reviewed): SB 48 bpm  Labs (5/22): Pro-BNP 2347, K 4.1, creatinine 1.46 Labs (6/22): K 3.9, creatinine 1.75 Labs (11/22): K 4.3, creatinine 1.5 Labs (5/23): LDL 59 Labs (6/23): K 4.7, creatinine 1.99 Labs (9/23): K 4.9, creatinine 1.75 Labs (10/23): K 4.2, creatinine 1.85 Labs (12/23): K 4.3, creatinine 1.71, LDL 60 Labs (1/24): K 3.8, creatinine 1.64  PMH: 1. Hyperlipidemia 2. HTN 3. CAD: CABG 2008 with LIMA-LAD, SVG-ramus, SVG-OM, SVG-PDA.  - Cardiolite (11/21): Small reversible inferolateral defect.  - LHC (6/22): LIMA-LAD and SVG-PDA were patent, sequential SVG-OM and ramus only touched down on OM.  Native RCA has a heavily calcified ostium with 95% stenosis, very difficult to seat the catheter due to calcification.  The native PDA is occluded at the ostium, and SVG-PDA only fills the PDA (does not back-fill to PLV and RCA).  Therefore, PLV territory is likely jeopardized by the ostial RCA stenosis. I reviewed the films with Dr. [place].  Potentially could intervene on ostial RCA using atherectomy (would be difficult).  As patient's primary symptoms are CHF-related and this is improved with diuresis, will manage medically now with option for PCI if she develops chest pain, etc. 4. Chronic diastolic CHF: Echo (3/22) with EF 60-65%, normal  RV.  - Multiple admission with CHF.  - RHC (6/22): mean RA 10, PA 42/15 mean 25, mean PCWP 13, CI 3.72, PVR 1.7 WU.  - Cardiomems 5. CKD: Stage 3.   SH: Lives in [place], retired from [employer], lives alone, has a son in [place].  Quit smoking in 2003, no ETOH.   Family History  Problem Relation Age of Onset   Cancer Other        Family Hx of Cancer   Coronary  artery disease Other        Family Hx of CAD   ROS: All systems reviewed and negative except as per HPI.   Current Outpatient Medications  Medication Sig Dispense Refill   acetaminophen (TYLENOL) 500 MG tablet Take 500-1,000 mg by mouth every 6 (six) hours as needed (pain).     ALPRAZolam (XANAX) 0.5 MG tablet Take 0.5 mg by mouth at bedtime as needed for anxiety or sleep.     aspirin EC 81 MG tablet Take 81 mg by mouth every evening. Swallow whole.     atorvastatin (LIPITOR) 40 MG tablet Take 40 mg by mouth every evening.  0   HUMALOG 100 UNIT/ML injection Inject 0.75 Units into the skin with breakfast, with lunch, and with evening meal. With insulin pump 24 hours adjust sometime when eat     hydrALAZINE (APRESOLINE) 50 MG tablet Take 1.5 tablets (75 mg total) by mouth 3 (three) times daily. 405 tablet 3   Insulin Disposable Pump (OMNIPOD DASH PODS, GEN 4,) MISC Take by mouth every 3 (three) days.     Insulin Pen Needle (COMFORT TOUCH INSULIN PEN NEED) 31G X 5 MM MISC 1 each by Does not apply route in the morning, at noon, in the evening, and at bedtime. 100 each 0   ipratropium (ATROVENT) 0.03 % nasal spray Place 1 spray into both nostrils 4 (four) times daily as needed (allergies).     isosorbide dinitrate (ISORDIL) 20 MG tablet TAKE 1 TABLET BY MOUTH 1 THREE TIMES DAILY 90 tablet 0   levocetirizine (XYZAL) 5 MG tablet Take 5 mg by mouth as needed for allergies.     loratadine (CLARITIN) 10 MG tablet Take 10 mg by mouth daily as needed for allergies.     metolazone (ZAROXOLYN) 2.5 MG tablet Take 1 tablet (2.5 mg total) by mouth once a week. 12 tablet 3   metoprolol tartrate (LOPRESSOR) 25 MG tablet Take 1/2 (one-half) tablet by mouth twice daily 30 tablet 0   nitroGLYCERIN (NITROSTAT) 0.4 MG SL tablet DISSOLVE ONE TABLET UNDER THE TONGUE EVERY 5 MINUTES AS NEEDED FOR CHEST PAIN.  DO NOT EXCEED A TOTAL OF 3 DOSES IN 15 MINUTES 25 tablet 1   omega-3 fish oil (MAXEPA) 1000 MG CAPS capsule Take  1,000 mg by mouth in the morning.     omeprazole (PRILOSEC) 20 MG capsule Take 20 mg by mouth in the morning.     ondansetron (ZOFRAN) 4 MG tablet Take 4 mg by mouth every 6 (six) hours as needed for nausea.     OXYGEN Inhale 2 L into the lungs daily as needed (Active).     PE-Triprolidine-DM-GG-APAP (MUCINEX CNG/CGH/COLD/FLU DY/NT PO) Patient takes 2 tablets every 4 hours as needed.     Potassium Chloride (KLOR-CON PO) Patient takes 20 mg tablets by mouth (4 tablets in the morning and evening.     promethazine (PHENERGAN) 25 MG tablet Take 12.5 mg by mouth every 6 (six) hours as needed  for vomiting or nausea.     spironolactone (ALDACTONE) 25 MG tablet Take 1 tablet by mouth once daily 90 tablet 3   torsemide (DEMADEX) 20 MG tablet Take 4 tablets (80 mg total) by mouth 2 (two) times daily. 240 tablet 3   traMADol (ULTRAM) 50 MG tablet Take 50 mg by mouth 3 (three) times daily as needed (arthritis pain).     TRELEGY ELLIPTA 200-62.5-25 MCG/ACT AEPB Inhale 1 puff into the lungs daily.     vitamin C (ASCORBIC ACID) 500 MG tablet Take 500 mg by mouth in the morning.     No current facility-administered medications for this encounter.   Wt Readings from Last 3 Encounters:  11/09/23 95 kg (209 lb 8 oz)  12/15/22 96.2 kg (212 lb)  11/14/22 95.9 kg (211 lb 6.4 oz)   BP (!) 158/76   Pulse (!) 52   Wt 95 kg (209 lb 8 oz)   SpO2 94%   BMI 40.92 kg/m  Physical Exam General:  Well appearing. No resp difficulty HEENT: normal Neck: supple. no JVD. Carotids 2+ bilat; no bruits. No lymphadenopathy or thryomegaly appreciated. Cor: PMI nondisplaced. Regular rate & rhythm. No rubs, gallops or murmurs. Lungs: clear Abdomen: soft, nontender, nondistended. No hepatosplenomegaly. No bruits or masses. Good bowel sounds. Extremities: no cyanosis, clubbing, rash, edema Neuro: alert & orientedx3, cranial nerves grossly intact. moves all 4 extremities w/o difficulty. Affect pleasant  Assessment/Plan: 1.  Chronic diastolic CHF: Echo in 3/22 with EF 60-65%, normal RV.  She does not have LVH on echo so suspect cardiac amyloidosis is unlikely.  Multiple CHF admissions.  Volume management is complicated by CKD stage 3.  Chronic NYHA class II-III symptoms. Volume up on exam. Cardiomems PAD mildly elevated at 20.  - Continue metolazone to 2.5 mg once weekly.  BMET/BNP today. - Increase torsemide 100 mg bid. Increase KCL to 100 bid - Continue Farxiga 10 mg daily.  - Continue spironolactone 25 mg daily. - Start Coreg 3.125 mg bid. Stop Metoprolol. In the setting of bradycardia and hypertension. 2. HTN: elevated today 158/76 - Continue Isordil 20 tid - Continue Hydral 50 tid. Prev decreased from 75 d/t fatigue and lightheadedness. - Coreg as above. 3. CAD: S/p CABG in 2008.  HS-TnI up to 1777 during 3/22 admission but no cath due to AKI.  She has prominent exertional dyspnea but only atypical chest pain.  LHC in 6/22 showed that LIMA-LAD and SVG-PDA were patent, sequential SVG-OM and ramus only touched down on OM.  Native RCA has a heavily calcified ostium with 95% stenosis, very difficult to seat the catheter due to calcification.  The native PDA is occluded at the ostium, and SVG-PDA only fills the PDA (does not back-fill to PLV and RCA).  Therefore, PLV territory is likely jeopardized by the ostial RCA stenosis. I reviewed the films with Dr. [place].  Potentially could intervene on ostial RCA using atherectomy (would be difficult).  As patient's primary symptoms are CHF-related, will manage medically now with option for PCI if she develops chest pain, etc. No chest pain.  - No s/s angina - Continue ASA 81 daily.  - Continue atorvastatin 40 mg daily. Lipids today - Stop metoprolol and start Coreg 3.125mg  bid 4. Suspect OSA: She says that she would not wear CPAP, so sleep study not ordered.  5. CKD stage 3: Last creatinine 1.64. BMET today.  6. Type 2 Diabetes: On insulin pump per PCP. A1C today. - On  Farxiga - Interested in  GLP1 7. Obesity: Body mass index is 40.92 kg/m. - Referral to PharmD for GLP1 consultation, interested in losing weight to help with symptom management. 8. OHS: Uses oxygen prn.   9. Asymmetric left lower leg swelling: This has been present for her report for several month. Noted on exam today. - Lower extremity venous dopplers (1/24) showed no evidence of DCT.  Follow up w Dr. Shirlee Latch in 3 month + Echo  [place] Anessia [place] FNP-BC 11/09/2023

## 2023-11-09 NOTE — Addendum Note (Signed)
Encounter addended by: Theresia Bough, CMA on: 11/09/2023 4:33 PM  Actions taken: Order list changed

## 2023-11-09 NOTE — Patient Instructions (Addendum)
Thank you for coming in today  If you had labs drawn today, any labs that are abnormal the clinic will call you No news is good news  You have been referred to Pharmacy for GLP-1, their office will call you for further appointment details    Medications: STOP Metoprolol Increase Torsemide to 100 mg 5 tablets twice daily Increase Potassium 100 meq 5 tablets twice daily Start Coreg 3.125 mg 1 tablet twice daily   Follow up appointments:  Your physician recommends that you schedule a follow-up appointment in:  3 months with Dr. Shirlee Latch with echocardiogram  Your physician has requested that you have an echocardiogram. Echocardiography is a painless test that uses sound waves to create images of your heart. It provides your doctor with information about the size and shape of your heart and how well your heart's chambers and valves are working. This procedure takes approximately one hour. There are no restrictions for this procedure.      Do the following things EVERYDAY: Weigh yourself in the morning before breakfast. Write it down and keep it in a log. Take your medicines as prescribed Eat low salt foods--Limit salt (sodium) to 2000 mg per day.  Stay as active as you can everyday Limit all fluids for the day to less than 2 liters   At the [facility], you and your health needs are our priority. As part of our continuing mission to provide you with exceptional heart care, we have created designated Provider Care Teams. These Care Teams include your primary Cardiologist (physician) and Advanced Practice Providers (APPs- Physician Assistants and Nurse Practitioners) who all work together to provide you with the care you need, when you need it.   You may see any of the following providers on your designated Care Team at your next follow up: Dr Arvilla Meres Dr Marca Ancona Dr. Marcos Eke, NP Robbie Lis, [place] [facility],  [place] Brynda Peon, NP Karle Plumber, PharmD   Please be sure to bring in all your medications bottles to every appointment.    Thank you for choosing [facility]  If you have any questions or concerns before your next appointment please send [place] a message through [place] or call our office at [phone].    TO LEAVE A MESSAGE FOR THE NURSE SELECT OPTION 2, PLEASE LEAVE A MESSAGE INCLUDING: YOUR NAME DATE OF BIRTH CALL BACK NUMBER REASON FOR CALL**this is important as we prioritize the call backs  YOU WILL RECEIVE A CALL BACK THE SAME DAY AS LONG AS YOU CALL BEFORE 4:00 PM

## 2023-11-13 ENCOUNTER — Telehealth (HOSPITAL_COMMUNITY): Payer: Self-pay | Admitting: Cardiology

## 2023-11-13 DIAGNOSIS — I5032 Chronic diastolic (congestive) heart failure: Secondary | ICD-10-CM

## 2023-11-13 NOTE — Telephone Encounter (Signed)
Pt left message on triage line with concerns regarding meds and change in potassium tablets from pharmacy    -[facility]

## 2023-11-15 MED ORDER — POTASSIUM CHLORIDE CRYS ER 10 MEQ PO TBCR: 100.0000 meq | EXTENDED_RELEASE_TABLET | Freq: Two times a day (BID) | ORAL | 3 refills | Status: DC

## 2023-11-15 MED ORDER — NITROGLYCERIN 0.4 MG SL SUBL: 0.4000 mg | SUBLINGUAL_TABLET | SUBLINGUAL | 1 refills | Status: AC | PRN

## 2023-11-15 NOTE — Telephone Encounter (Signed)
She should stay off metoprolol. She was bradycardic at last follow-up. Would try losartan 25 mg daily to help with her blood pressure. Needs BMET in 2 weeks.

## 2023-11-15 NOTE — Telephone Encounter (Signed)
Pt reports she cannot tolerate new B/P med coreg. Reports she was unable to get out of bed x 2 days, no energy, shaking all over -took 3 doses and restarted Metoprolol -please advise further if needed     Updated potassium to tablets, pt reports she cannot swallow 20's, rx updated for -aware insurance may not pay for another fill Will pay out of pocket  NTG refilled

## 2023-11-16 ENCOUNTER — Other Ambulatory Visit (HOSPITAL_COMMUNITY): Payer: Self-pay | Admitting: Cardiology

## 2023-11-16 DIAGNOSIS — I5032 Chronic diastolic (congestive) heart failure: Secondary | ICD-10-CM

## 2023-11-16 MED ORDER — LOSARTAN POTASSIUM 25 MG PO TABS: 25.0000 mg | ORAL_TABLET | Freq: Every day | ORAL | 3 refills | Status: DC

## 2023-11-16 NOTE — Telephone Encounter (Signed)
Pt aware and voiced understanding 

## 2023-12-08 ENCOUNTER — Other Ambulatory Visit (HOSPITAL_COMMUNITY): Payer: Self-pay | Admitting: Cardiology

## 2023-12-10 ENCOUNTER — Other Ambulatory Visit (HOSPITAL_COMMUNITY): Payer: Self-pay | Admitting: Cardiology

## 2024-01-01 ENCOUNTER — Ambulatory Visit: Payer: PPO

## 2024-01-11 ENCOUNTER — Other Ambulatory Visit (HOSPITAL_COMMUNITY): Payer: Self-pay | Admitting: Cardiology

## 2024-02-07 ENCOUNTER — Other Ambulatory Visit (HOSPITAL_COMMUNITY): Payer: Self-pay | Admitting: Cardiology

## 2024-02-14 ENCOUNTER — Ambulatory Visit (HOSPITAL_COMMUNITY)
Admission: RE | Admit: 2024-02-14 | Discharge: 2024-02-14 | Disposition: A | Discharge status: Home / Self Care | Payer: PPO | Source: Ambulatory Visit | Attending: Cardiovascular Disease | Admitting: Cardiovascular Disease

## 2024-02-14 ENCOUNTER — Telehealth (HOSPITAL_COMMUNITY): Payer: Self-pay

## 2024-02-14 ENCOUNTER — Encounter: Payer: Self-pay | Admitting: Pharmacist Clinician (PhC)/ Clinical Pharmacy Specialist

## 2024-02-14 ENCOUNTER — Ambulatory Visit: Payer: PPO | Admitting: Pharmacist Clinician (PhC)/ Clinical Pharmacy Specialist

## 2024-02-14 ENCOUNTER — Encounter (HOSPITAL_COMMUNITY): Payer: Self-pay | Admitting: Cardiology

## 2024-02-14 ENCOUNTER — Other Ambulatory Visit (HOSPITAL_COMMUNITY): Payer: Self-pay

## 2024-02-14 ENCOUNTER — Ambulatory Visit (HOSPITAL_COMMUNITY)
Admission: RE | Admit: 2024-02-14 | Discharge: 2024-02-14 | Disposition: A | Discharge status: Home / Self Care | Payer: PPO | Source: Ambulatory Visit | Attending: Cardiology | Admitting: Cardiology

## 2024-02-14 ENCOUNTER — Telehealth: Payer: Self-pay | Admitting: Pharmacist Clinician (PhC)/ Clinical Pharmacy Specialist

## 2024-02-14 ENCOUNTER — Telehealth: Payer: Self-pay | Admitting: Pharmacy Technician

## 2024-02-14 VITALS — BP 140/80 | HR 65 | Wt 212.0 lb

## 2024-02-14 VITALS — Ht 60.0 in | Wt 212.4 lb

## 2024-02-14 DIAGNOSIS — Z9641 Presence of insulin pump (external) (internal): Secondary | ICD-10-CM | POA: Insufficient documentation

## 2024-02-14 DIAGNOSIS — Z7984 Long term (current) use of oral hypoglycemic drugs: Secondary | ICD-10-CM | POA: Insufficient documentation

## 2024-02-14 DIAGNOSIS — Z794 Long term (current) use of insulin: Secondary | ICD-10-CM

## 2024-02-14 DIAGNOSIS — Q2112 Patent foramen ovale: Secondary | ICD-10-CM | POA: Diagnosis not present

## 2024-02-14 DIAGNOSIS — I2510 Atherosclerotic heart disease of native coronary artery without angina pectoris: Secondary | ICD-10-CM | POA: Diagnosis not present

## 2024-02-14 DIAGNOSIS — N183 Chronic kidney disease, stage 3 unspecified: Secondary | ICD-10-CM | POA: Diagnosis not present

## 2024-02-14 DIAGNOSIS — E669 Obesity, unspecified: Secondary | ICD-10-CM | POA: Diagnosis not present

## 2024-02-14 DIAGNOSIS — E118 Type 2 diabetes mellitus with unspecified complications: Secondary | ICD-10-CM

## 2024-02-14 DIAGNOSIS — E1122 Type 2 diabetes mellitus with diabetic chronic kidney disease: Secondary | ICD-10-CM | POA: Diagnosis not present

## 2024-02-14 DIAGNOSIS — I5032 Chronic diastolic (congestive) heart failure: Secondary | ICD-10-CM | POA: Insufficient documentation

## 2024-02-14 DIAGNOSIS — I130 Hypertensive heart and chronic kidney disease with heart failure and stage 1 through stage 4 chronic kidney disease, or unspecified chronic kidney disease: Secondary | ICD-10-CM | POA: Diagnosis not present

## 2024-02-14 DIAGNOSIS — Z6841 Body Mass Index (BMI) 40.0 and over, adult: Secondary | ICD-10-CM | POA: Diagnosis not present

## 2024-02-14 DIAGNOSIS — I083 Combined rheumatic disorders of mitral, aortic and tricuspid valves: Secondary | ICD-10-CM | POA: Insufficient documentation

## 2024-02-14 DIAGNOSIS — Z79899 Other long term (current) drug therapy: Secondary | ICD-10-CM | POA: Insufficient documentation

## 2024-02-14 DIAGNOSIS — Z951 Presence of aortocoronary bypass graft: Secondary | ICD-10-CM | POA: Diagnosis not present

## 2024-02-14 DIAGNOSIS — Z7982 Long term (current) use of aspirin: Secondary | ICD-10-CM | POA: Diagnosis not present

## 2024-02-14 LAB — ECHOCARDIOGRAM COMPLETE
AR max vel: 2 cm2
AV Area VTI: 2.34 cm2
AV Area mean vel: 2.21 cm2
AV Mean grad: 6.3 mmHg
AV Peak grad: 11.2 mmHg
Ao pk vel: 1.68 m/s
Area-P 1/2: 4.06 cm2
Calc EF: 65.3 %
MV VTI: 2.69 cm2
S' Lateral: 3.4 cm
Single Plane A2C EF: 66 %
Single Plane A4C EF: 64.7 %

## 2024-02-14 LAB — BASIC METABOLIC PANEL
Anion gap: 11 (ref 5–15)
BUN: 35 mg/dL — ABNORMAL HIGH (ref 8–23)
CO2: 31 mmol/L (ref 22–32)
Calcium: 9 mg/dL (ref 8.9–10.3)
Chloride: 95 mmol/L — ABNORMAL LOW (ref 98–111)
Creatinine, Ser: 1.84 mg/dL — ABNORMAL HIGH (ref 0.44–1.00)
GFR, Estimated: 28 mL/min — ABNORMAL LOW (ref >60)
Glucose, Bld: 98 mg/dL (ref 70–99)
Potassium: 3.7 mmol/L (ref 3.5–5.1)
Sodium: 137 mmol/L (ref 135–145)

## 2024-02-14 LAB — BRAIN NATRIURETIC PEPTIDE: B Natriuretic Peptide: 583.4 pg/mL — ABNORMAL HIGH (ref 0.0–100.0)

## 2024-02-14 MED ORDER — ATORVASTATIN CALCIUM 80 MG PO TABS: 80.0000 mg | ORAL_TABLET | Freq: Every evening | ORAL | 3 refills | Status: AC

## 2024-02-14 MED ORDER — FARXIGA 10 MG PO TABS: 10.0000 mg | ORAL_TABLET | Freq: Every day | ORAL | 3 refills | Status: AC

## 2024-02-14 MED ORDER — POTASSIUM CHLORIDE CRYS ER 10 MEQ PO TBCR: 100.0000 meq | EXTENDED_RELEASE_TABLET | Freq: Two times a day (BID) | ORAL | 3 refills | Status: DC

## 2024-02-14 NOTE — Assessment & Plan Note (Signed)
 Patient has not met goal of at least 5% of body weight loss with comprehensive lifestyle modifications alone in the past 3-6 months. Pharmacotherapy is appropriate to pursue as augmentation. Will start Mounjaro.   Confirmed patient not pregnant and no personal or family history of medullary thyroid carcinoma (MTC) or Multiple Endocrine Neoplasia syndrome type 2 (MEN 2).   Advised patient on common side effects including nausea, diarrhea, dyspepsia, decreased appetite, and fatigue. Counseled patient on reducing meal size and how to titrate medication to minimize side effects. Patient aware to call if intolerable side effects or if experiencing dehydration, abdominal pain, or dizziness. Patient will adhere to dietary modifications and will target at least 150 minutes of moderate intensity exercise weekly.   Injection technique reviewed at today's visit.  Will have her watch blood sugars closely as dose is titrated.    Titration Plan:  Will plan to follow the titration plan as below, pending patient is tolerating each dose before increasing to the next. Can slow titration if needed for tolerability.    -Month 1: Inject 2.5 mg SQ once weekly x 4 weeks -Month 2: Inject 5 mg SQ once weekly x 4 weeks -Month 3: Inject 7.5 mg SQ once weekly x 4 weeks -Month 4+: Inject 10 mg SQ once weekly   Follow up in 3 months.

## 2024-02-14 NOTE — Progress Notes (Signed)
 Office Visit    Patient Name: [name] Date of Encounter: 02/14/2024  Primary Care Provider:  Toma Deiters, MD Primary Cardiologist:  Dina Rich, MD  Chief Complaint    Weight management  Significant Past Medical History   DM2 12/24 A1c 7.8, on Humalog, Omnipod  CHF On losartan, spironolactone; cannot tolerate beta blocker  CAD 2008 CABG  HTN Poorly controlled, on hydralazine 50 tid, losartan 25 every day, spironolactone 25 every day,   CKD3 12/26 SCr 1.76, GFR 30    Allergies  Allergen Reactions   Gabapentin Other (See Comments)    Jerking movements    History of Present Illness    [name] is a 78 y.o. female patient of Dr Shirlee Latch, in the office today to discuss options for weight management.  She is in the office today with her daughter.  She does have a diagnosis of DM2 and notes that both her PCP and Dr. Shirlee Latch feel that she would do well on GLP therapy.    [place] - SEEING HF CLINIC TODAY  If diabetic and on insulin/sulfonylurea, can consider reducing dose to reduce risk of hypoglycemia  Current weight management medications: none  Current meds that may affect weight: none  Baseline weight/BMI: 96.3 kg // 41.48  Insurance payor: [facility] plan  Diet: toast or full breakfast in the morning.  May eat again around 2 pm then only snack the remainder of the day.  Wil munch of carrots or fruit;  drinks (coke, fanta, lemonade, sprite), not a lot of water. ~32 + ounces of sodas per day;   Exercise: can walk with cane outside, but runs out of air easily;  arthritis in back limits her  Family History:   Confirmed patient not pregnant and no personal or family history of medullary thyroid carcinoma (MTC) or Multiple Endocrine Neoplasia syndrome type 2 (MEN 2).   Social History:   Tobacco:  quit in 2002   Alcohol: no  Accessory Clinical Findings    Lab Results  Component Value Date   CREATININE 1.84 (H) 02/14/2024    BUN 35 (H) 02/14/2024   NA 137 02/14/2024   K 3.7 02/14/2024   CL 95 (L) 02/14/2024   CO2 31 02/14/2024   Lab Results  Component Value Date   ALT 13 01/31/2021   AST 14 (L) 01/31/2021   ALKPHOS 71 01/31/2021   BILITOT 0.8 01/31/2021   Lab Results  Component Value Date   HGBA1C 7.8 (H) 11/09/2023      Home Medications/Allergies    Current Outpatient Medications  Medication Sig Dispense Refill   acetaminophen (TYLENOL) 500 MG tablet Take 500-1,000 mg by mouth every 6 (six) hours as needed (pain).     aspirin EC 81 MG tablet Take 81 mg by mouth every evening. Swallow whole.     atorvastatin (LIPITOR) 80 MG tablet Take 1 tablet (80 mg total) by mouth every evening. 90 tablet 3   FARXIGA 10 MG TABS tablet Take 1 tablet (10 mg total) by mouth daily before breakfast. 90 tablet 3   HUMALOG 100 UNIT/ML injection Inject 0.75 Units into the skin with breakfast, with lunch, and with evening meal. With insulin pump 24 hours adjust sometime when eat     hydrALAZINE (APRESOLINE) 50 MG tablet Take 50 mg by mouth 3 (three) times daily.     Insulin Disposable Pump (OMNIPOD DASH PODS, GEN 4,) MISC Take by mouth every 3 (three) days.  ipratropium (ATROVENT) 0.03 % nasal spray Place 1 spray into both nostrils 4 (four) times daily as needed (allergies).     isosorbide dinitrate (ISORDIL) 20 MG tablet TAKE 1 TABLET BY MOUTH THREE TIMES DAILY 90 tablet 3   levocetirizine (XYZAL) 5 MG tablet Take 5 mg by mouth as needed for allergies.     loratadine (CLARITIN) 10 MG tablet Take 10 mg by mouth daily as needed for allergies.     metolazone (ZAROXOLYN) 2.5 MG tablet Take 1 tablet by mouth once a week 12 tablet 1   nitroGLYCERIN (NITROSTAT) 0.4 MG SL tablet Place 1 tablet (0.4 mg total) under the tongue every 5 (five) minutes as needed for chest pain. 25 tablet 1   omeprazole (PRILOSEC) 20 MG capsule Take 20 mg by mouth in the morning.     OXYGEN Inhale 2 L into the lungs daily as needed (Active).      PE-Triprolidine-DM-GG-APAP (MUCINEX CNG/CGH/COLD/FLU DY/NT PO) Patient takes 2 tablets every 4 hours as needed.     potassium chloride (KLOR-CON M) 10 MEQ tablet Take 10 tablets (100 mEq total) by mouth 2 (two) times daily. 200 tablet 3   spironolactone (ALDACTONE) 25 MG tablet Take 1 tablet by mouth once daily 90 tablet 3   torsemide (DEMADEX) 20 MG tablet Take 5 tablets (100 mg total) by mouth 2 (two) times daily. 300 tablet 6   traMADol (ULTRAM) 50 MG tablet Take 50 mg by mouth 3 (three) times daily as needed (arthritis pain).     TRELEGY ELLIPTA 200-62.5-25 MCG/ACT AEPB Inhale 1 puff into the lungs daily.     No current facility-administered medications for this visit.     Allergies  Allergen Reactions   Gabapentin Other (See Comments)    Jerking movements    Assessment & Plan    Type 2 diabetes mellitus with complication, with long-term current use of insulin (HCC) Patient has not met goal of at least 5% of body weight loss with comprehensive lifestyle modifications alone in the past 3-6 months. Pharmacotherapy is appropriate to pursue as augmentation. Will start Mounjaro.   Confirmed patient not pregnant and no personal or family history of medullary thyroid carcinoma (MTC) or Multiple Endocrine Neoplasia syndrome type 2 (MEN 2).   Advised patient on common side effects including nausea, diarrhea, dyspepsia, decreased appetite, and fatigue. Counseled patient on reducing meal size and how to titrate medication to minimize side effects. Patient aware to call if intolerable side effects or if experiencing dehydration, abdominal pain, or dizziness. Patient will adhere to dietary modifications and will target at least 150 minutes of moderate intensity exercise weekly.   Injection technique reviewed at today's visit.  Will have her watch blood sugars closely as dose is titrated.    Titration Plan:  Will plan to follow the titration plan as below, pending patient is tolerating each dose  before increasing to the next. Can slow titration if needed for tolerability.    -Month 1: Inject 2.5 mg SQ once weekly x 4 weeks -Month 2: Inject 5 mg SQ once weekly x 4 weeks -Month 3: Inject 7.5 mg SQ once weekly x 4 weeks -Month 4+: Inject 10 mg SQ once weekly   Follow up in 3 months.    Phillips Hay PharmD CPP [facility]  [address] [place], [place] [address] [phone]

## 2024-02-14 NOTE — Telephone Encounter (Signed)
 Patient Advocate Encounter  Test billing for Marcelline Deist and London Pepper both return $0 copay for 90 days on the current coverage.  Burnell Blanks, CPhT Rx Patient Advocate Phone: [phone]

## 2024-02-14 NOTE — Progress Notes (Signed)
 PCP: Toma Deiters, MD Cardiology: Dr. Wyline Mood HF Cardiology: Dr. Shirlee Latch  Chief complaint: CHF  78 y.o. with history of CAD, diastolic CHF, and poorly controlled HTN was referred by Dr. Wyline Mood for CHF evaluation. Patient had CABG in 2008.  She has poorly controlled HTN and CKD stage 3.  She was admitted in 10/21 with hypertensive urgency and CHF due to medication noncompliance.  She was admitted again in 3/22 with acute diastolic CHF, AKI to 2.9. HS-TnI was 1777 but cath was not done due to AKI.  Echo showed EF 60-65% with normal RV.  She was again admitted (this time to [facility]) in 5/22 with CHF.  No med changes were made.  Given multiple admission with CHF and difficulty managing volume, she was referred to CHF clinic.  Torsemide was increased and Jardiance was started.   LHC/RHC in 6/22 showed mildly elevated RA pressure, normal PCWP.  LIMA-LAD and SVG-PDA were patent, sequential SVG-OM and ramus only touched down on OM.  Native RCA has a heavily calcified ostium with 95% stenosis, very difficult to seat the catheter due to calcification.  The native PDA is occluded at the ostium, and SVG-PDA only fills the PDA (does not back-fill to PLV and RCA).  Therefore, PLV territory is likely jeopardized by the ostial RCA stenosis. I reviewed the films with Dr. [place].  Potentially could intervene on ostial RCA using atherectomy (would be difficult).  As patient's primary symptoms have been CHF-related and this was improved with diuresis, will manage medically now with option for PCI if she develops chest pain, etc.  Echo was done today and reviewed, EF 55-60%, mild LVH, moderate diastolic dysfunction, normal RV size/systolic function.   Today she returns for HF follow up. Weight up 3 lbs.  She stopped taking Marcelline Deist as it was too expensive.  She is going to be starting Mounjaro.  She is short of breath walking across the house.  She is mildly short of breath dressing and bathing.  No chest pain.  No  lightheadedness.  SBP generally around 130 at home.    Cardiomems today: PADP 19, goal 18   ECG (personally reviewed): NSR, inferior and anterolateral Qs  Labs (5/22): Pro-BNP 2347, K 4.1, creatinine 1.46 Labs (6/22): K 3.9, creatinine 1.75 Labs (11/22): K 4.3, creatinine 1.5 Labs (5/23): LDL 59 Labs (6/23): K 4.7, creatinine 1.99 Labs (9/23): K 4.9, creatinine 1.75 Labs (10/23): K 4.2, creatinine 1.85 Labs (12/23): K 4.3, creatinine 1.71, LDL 60 Labs (1/24): K 3.8, creatinine 1.64 Labs (12/24): LDL 65, K 3.8, creatinine 9.60, BNP 534  PMH: 1. Hyperlipidemia 2. HTN 3. CAD: CABG 2008 with LIMA-LAD, SVG-ramus, SVG-OM, SVG-PDA.  - Cardiolite (11/21): Small reversible inferolateral defect.  - LHC (6/22): LIMA-LAD and SVG-PDA were patent, sequential SVG-OM and ramus only touched down on OM.  Native RCA has a heavily calcified ostium with 95% stenosis, very difficult to seat the catheter due to calcification.  The native PDA is occluded at the ostium, and SVG-PDA only fills the PDA (does not back-fill to PLV and RCA).  Therefore, PLV territory is likely jeopardized by the ostial RCA stenosis. I reviewed the films with Dr. [place].  Potentially could intervene on ostial RCA using atherectomy (would be difficult).  As patient's primary symptoms are CHF-related and this is improved with diuresis, will manage medically now with option for PCI if she develops chest pain, etc. 4. Chronic diastolic CHF: Echo (3/22) with EF 60-65%, normal RV.  - Multiple admission with CHF.  -  RHC (6/22): mean RA 10, PA 42/15 mean 25, mean PCWP 13, CI 3.72, PVR 1.7 WU.  - Cardiomems - Echo (3/25): EF 55-60%, mild LVH, moderate diastolic dysfunction, normal RV size/systolic function.  5. CKD: Stage 3.   SH: Lives in [place], retired from [employer], lives alone, has a son in [place].  Quit smoking in 2003, no ETOH.   Family History  Problem Relation Age of Onset   Cancer Other        Family Hx  of Cancer   Coronary artery disease Other        Family Hx of CAD   ROS: All systems reviewed and negative except as per HPI.   Current Outpatient Medications  Medication Sig Dispense Refill   acetaminophen (TYLENOL) 500 MG tablet Take 500-1,000 mg by mouth every 6 (six) hours as needed (pain).     aspirin EC 81 MG tablet Take 81 mg by mouth every evening. Swallow whole.     FARXIGA 10 MG TABS tablet Take 1 tablet (10 mg total) by mouth daily before breakfast. 90 tablet 3   HUMALOG 100 UNIT/ML injection Inject 0.75 Units into the skin with breakfast, with lunch, and with evening meal. With insulin pump 24 hours adjust sometime when eat     hydrALAZINE (APRESOLINE) 50 MG tablet Take 50 mg by mouth 3 (three) times daily.     Insulin Disposable Pump (OMNIPOD DASH PODS, GEN 4,) MISC Take by mouth every 3 (three) days.     ipratropium (ATROVENT) 0.03 % nasal spray Place 1 spray into both nostrils 4 (four) times daily as needed (allergies).     isosorbide dinitrate (ISORDIL) 20 MG tablet TAKE 1 TABLET BY MOUTH THREE TIMES DAILY 90 tablet 3   levocetirizine (XYZAL) 5 MG tablet Take 5 mg by mouth as needed for allergies.     loratadine (CLARITIN) 10 MG tablet Take 10 mg by mouth daily as needed for allergies.     metolazone (ZAROXOLYN) 2.5 MG tablet Take 1 tablet by mouth once a week 12 tablet 1   nitroGLYCERIN (NITROSTAT) 0.4 MG SL tablet Place 1 tablet (0.4 mg total) under the tongue every 5 (five) minutes as needed for chest pain. 25 tablet 1   omeprazole (PRILOSEC) 20 MG capsule Take 20 mg by mouth in the morning.     OXYGEN Inhale 2 L into the lungs daily as needed (Active).     PE-Triprolidine-DM-GG-APAP (MUCINEX CNG/CGH/COLD/FLU DY/NT PO) Patient takes 2 tablets every 4 hours as needed.     spironolactone (ALDACTONE) 25 MG tablet Take 1 tablet by mouth once daily 90 tablet 3   torsemide (DEMADEX) 20 MG tablet Take 5 tablets (100 mg total) by mouth 2 (two) times daily. 300 tablet 6   traMADol  (ULTRAM) 50 MG tablet Take 50 mg by mouth 3 (three) times daily as needed (arthritis pain).     TRELEGY ELLIPTA 200-62.5-25 MCG/ACT AEPB Inhale 1 puff into the lungs daily.     atorvastatin (LIPITOR) 80 MG tablet Take 1 tablet (80 mg total) by mouth every evening. 90 tablet 3   potassium chloride (KLOR-CON M) 10 MEQ tablet Take 10 tablets (100 mEq total) by mouth 2 (two) times daily. 200 tablet 3   No current facility-administered medications for this encounter.   Wt Readings from Last 3 Encounters:  02/14/24 96.2 kg (212 lb)  02/14/24 96.3 kg (212 lb 6.4 oz)  11/09/23 95 kg (209 lb 8 oz)   BP Marland Kitchen)  140/80   Pulse 65   Wt 96.2 kg (212 lb)   SpO2 92%   BMI 41.40 kg/m  General: NAD Neck: JVP 8-9 cm, no thyromegaly or thyroid nodule.  Lungs: Clear to auscultation bilaterally with normal respiratory effort. CV: Nondisplaced PMI.  Heart regular S1/S2, no S3/S4, no murmur.  1+ left ankle edema.  No carotid bruit.  Normal pedal pulses.  Abdomen: Soft, nontender, no hepatosplenomegaly, no distention.  Skin: Intact without lesions or rashes.  Neurologic: Alert and oriented x 3.  Psych: Normal affect. Extremities: No clubbing or cyanosis.  HEENT: Normal.   Assessment/Plan: 1. Chronic diastolic CHF: Echo in 3/22 with EF 60-65%, normal RV.  She has had only mild LVH on echo so suspect cardiac amyloidosis is unlikely.  Multiple CHF admissions.  Volume management is complicated by CKD stage 3.  Echo today showed EF 55-60%, mild LVH, moderate diastolic dysfunction, normal RV size/systolic function. Chronic NYHA class III symptoms. Mild volume overload on exam with mildly elevated PADP at 19 (goal 18).  - Continue metolazone to 2.5 mg once weekly.  BMET/BNP today. - Continue torsemide 100 mg bid and KCl 100 bid - Restart Farxiga 10 mg daily.  This should be covered by her insurance now.  This will allow some extra diuresis.  - Continue spironolactone 25 mg daily 2. HTN: BP controlled.  - Continue  hydralazine 50 tid.  - Continue spironolactone 25 mg daily.  3. CAD: S/p CABG in 2008.  HS-TnI up to 1777 during 3/22 admission but no cath due to AKI.  She has prominent exertional dyspnea but only atypical chest pain.  LHC in 6/22 showed that LIMA-LAD and SVG-PDA were patent, sequential SVG-OM and ramus only touched down on OM.  Native RCA has a heavily calcified ostium with 95% stenosis, very difficult to seat the catheter due to calcification.  The native PDA is occluded at the ostium, and SVG-PDA only fills the PDA (does not back-fill to PLV and RCA).  Therefore, PLV territory is likely jeopardized by the ostial RCA stenosis. I reviewed the films with Dr. [place].  Potentially could intervene on ostial RCA using atherectomy (would be difficult).  As patient's primary symptoms are CHF-related, will manage medically now with option for PCI if she develops chest pain, etc. No chest pain.  - Continue ASA 81 daily.  - Goal LDL < 55, increase atorvastatin to 80 mg daily with lipids/LFTs in 2 months.  4. Suspect OSA: She says that she would not wear CPAP, so sleep study not ordered.  5. CKD stage 3: Last creatinine 1.76. BMET today.  6. Type 2 Diabetes: On insulin pump per PCP.  - On Farxiga - To start Mounjaro.  7. Obesity: Body mass index is 41.4 kg/m. - To start Mounjaro.  8. OHS: Uses oxygen prn.   9. Asymmetric left lower leg swelling: Chronic, venous dopplers in 1/24 showed no DVT.   Follow up 3 months with APP.   I spent 32 minutes reviewing records, interviewing/examining patient, and managing orders.   Marca Ancona  02/14/2024

## 2024-02-14 NOTE — Telephone Encounter (Signed)
 Pharmacy Patient Advocate Encounter  Received notification from [facility] ADVANTAGE/RX ADVANCE that Prior Authorization for mounjaro has been APPROVED from 02/14/24 to 02/13/25. Ran test claim, Copay is $0.00- one month. This test claim was processed through [facility]- copay amounts may vary at other pharmacies due to pharmacy/plan contracts, or as the patient moves through the different stages of their insurance plan.   PA #/Case ID/Reference #: [id number]

## 2024-02-14 NOTE — Telephone Encounter (Signed)
 Pharmacy Patient Advocate Encounter   Received notification from Pt Calls Messages that prior authorization for mounjaro is required/requested.   Insurance verification completed.   The patient is insured through [facility] ADVANTAGE/RX ADVANCE .   Per test claim: PA required; PA submitted to above mentioned insurance via CoverMyMeds Key/confirmation #/[id number] Status is pending

## 2024-02-14 NOTE — Patient Instructions (Signed)
We will start the prior authorization process to get [facility] covered by your insurance.   TIPS FOR SUCCESS Write down the reasons why you want to lose weight and post it in a place where you'll see it often. Start small and work your way up. Keep in mind that it takes time to achieve goals, and small steps add up. Any additional movements help to burn calories. Taking the stairs rather than the elevator and parking at the far end of your parking lot are easy ways to start. Brisk walking for at least 30 minutes 4 or more days of the week is an excellent goal to work toward  [employer] WHAT IT MEANS TO FEEL FULL Did you know that it can take 15 minutes or more for your brain to receive the message that you've eaten? That means that, if you eat less food, but consume it slower, you may still feel satisfied. Eating a lot of fruits and vegetables can also help you feel fuller. Eat off of smaller plates so that moderate portions don't seem too small  TITRATION PLAN Will plan to follow the titration plan as below, pending patient is tolerating each dose before increasing to the next. Can slow titration if needed for tolerability.    -Weeks 1-4: Inject 2.5 mg SQ once weekly  -Weeks 5-8: Inject 5 mg SQ once weekly  -Weeks 9-12: Inject 7.5 mg SQ once weekly  -Weeks 13-16: Inject 10 mg SQ once weekly   Follow up in 3 months.  If you have any questions or concerns, please reach out to [place].  [name]/Chris at [phone].  THANK YOU FOR CHOOSING [facility]

## 2024-02-14 NOTE — Telephone Encounter (Signed)
 Please do PA for Mounjaro (pt has dx of DM2)

## 2024-02-14 NOTE — Patient Instructions (Addendum)
 INCREASE Atorvastatin to 80 mg daily.  RESTART Farxiga 10 mg daily.  Labs done today, your results will be available in MyChart, we will contact you for abnormal readings.  Repeat blood work in 10 days and again in 2 months.  Your physician recommends that you schedule a follow-up appointment in: 4 months.  If you have any questions or concerns before your next appointment please send [place] a message through [place] or call our office at [phone].    TO LEAVE A MESSAGE FOR THE NURSE SELECT OPTION 2, PLEASE LEAVE A MESSAGE INCLUDING: YOUR NAME DATE OF BIRTH CALL BACK NUMBER REASON FOR CALL**this is important as we prioritize the call backs  YOU WILL RECEIVE A CALL BACK THE SAME DAY AS LONG AS YOU CALL BEFORE 4:00 PM  At the [facility], you and your health needs are our priority. As part of our continuing mission to provide you with exceptional heart care, we have created designated Provider Care Teams. These Care Teams include your primary Cardiologist (physician) and Advanced Practice Providers (APPs- Physician Assistants and Nurse Practitioners) who all work together to provide you with the care you need, when you need it.   You may see any of the following providers on your designated Care Team at your next follow up: Dr Arvilla Meres Dr Marca Ancona Dr. Dorthula Nettles Dr. Clearnce Hasten Amy Filbert Schilder, NP Robbie Lis, [place] [facility] [place], [place] Brynda Peon, NP [place] Lee, NP Clarisa Kindred, NP Karle Plumber, PharmD Enos Fling, PharmD   Please be sure to bring in all your medications bottles to every appointment.    Thank you for choosing [facility]

## 2024-02-19 MED ORDER — MOUNJARO 5 MG/0.5ML ~~LOC~~ SOAJ
5.0000 mg | SUBCUTANEOUS | 0 refills | Status: DC
Start: 2024-02-19 — End: 2024-06-26

## 2024-02-19 MED ORDER — MOUNJARO 2.5 MG/0.5ML ~~LOC~~ SOAJ: 2.5000 mg | SUBCUTANEOUS | 0 refills | Status: DC

## 2024-02-19 NOTE — Addendum Note (Signed)
 Addended by: Rosalee Kaufman on: 02/19/2024 05:04 PM   Modules accepted: Orders

## 2024-02-25 ENCOUNTER — Other Ambulatory Visit (HOSPITAL_COMMUNITY): Payer: Self-pay | Admitting: Cardiology

## 2024-02-25 DIAGNOSIS — I5032 Chronic diastolic (congestive) heart failure: Secondary | ICD-10-CM

## 2024-02-29 ENCOUNTER — Telehealth (HOSPITAL_COMMUNITY): Payer: Self-pay | Admitting: Cardiology

## 2024-02-29 LAB — LIPID PANEL
Chol/HDL Ratio: 3 ratio (ref 0.0–4.4)
Cholesterol, Total: 130 mg/dL (ref 100–199)
HDL: 43 mg/dL (ref >39)
LDL Chol Calc (NIH): 62 mg/dL (ref 0–99)
Triglycerides: 146 mg/dL (ref 0–149)
VLDL Cholesterol Cal: 25 mg/dL (ref 5–40)

## 2024-02-29 LAB — BASIC METABOLIC PANEL WITH GFR
BUN/Creatinine Ratio: 17 (ref 12–28)
BUN: 34 mg/dL — ABNORMAL HIGH (ref 8–27)
CO2: 28 mmol/L (ref 20–29)
Calcium: 9.8 mg/dL (ref 8.7–10.3)
Chloride: 88 mmol/L — ABNORMAL LOW (ref 96–106)
Creatinine, Ser: 2.02 mg/dL — ABNORMAL HIGH (ref 0.57–1.00)
Glucose: 190 mg/dL — ABNORMAL HIGH (ref 70–99)
Potassium: 5 mmol/L (ref 3.5–5.2)
Sodium: 135 mmol/L (ref 134–144)
eGFR: 25 mL/min/{1.73_m2} — ABNORMAL LOW (ref >59)

## 2024-02-29 LAB — HEPATIC FUNCTION PANEL
ALT: 5 IU/L (ref 0–32)
AST: 9 IU/L (ref 0–40)
Albumin: 4.2 g/dL (ref 3.8–4.8)
Alkaline Phosphatase: 126 IU/L — ABNORMAL HIGH (ref 44–121)
Bilirubin Total: 0.4 mg/dL (ref 0.0–1.2)
Bilirubin, Direct: 0.15 mg/dL (ref 0.00–0.40)
Total Protein: 6.8 g/dL (ref 6.0–8.5)

## 2024-02-29 NOTE — Telephone Encounter (Signed)
-----   Message from Marca Ancona sent at 02/29/2024  9:08 AM EDT ----- Creatinine and K are higher.  Would hold torsemide for 1 day, then decrease dose to 80 mg bid and decrease KCl to 60 bid. BMET 1 week.

## 2024-03-03 ENCOUNTER — Encounter (HOSPITAL_COMMUNITY): Payer: Self-pay

## 2024-03-05 ENCOUNTER — Other Ambulatory Visit (HOSPITAL_COMMUNITY): Payer: Self-pay | Admitting: Cardiology

## 2024-03-18 LAB — BASIC METABOLIC PANEL WITH GFR
BUN/Creatinine Ratio: 19 (ref 12–28)
BUN: 40 mg/dL — ABNORMAL HIGH (ref 8–27)
CO2: 31 mmol/L — ABNORMAL HIGH (ref 20–29)
Calcium: 9.4 mg/dL (ref 8.7–10.3)
Chloride: 85 mmol/L — ABNORMAL LOW (ref 96–106)
Creatinine, Ser: 2.1 mg/dL — ABNORMAL HIGH (ref 0.57–1.00)
Glucose: 254 mg/dL — ABNORMAL HIGH (ref 70–99)
Potassium: 4 mmol/L (ref 3.5–5.2)
Sodium: 134 mmol/L (ref 134–144)
eGFR: 24 mL/min/{1.73_m2} — ABNORMAL LOW (ref >59)

## 2024-03-20 ENCOUNTER — Telehealth (HOSPITAL_COMMUNITY): Payer: Self-pay | Admitting: Cardiology

## 2024-03-20 NOTE — Telephone Encounter (Signed)
-----   Message from [facility] [place] [facility] sent at 03/18/2024  3:00 PM EDT ----- Creatinine remains elevated. Please confirm what patient is taking. Med list reflects different doses of Torsemide  and KCL than what is in Dr. Charline Contras recs from result note 04/04

## 2024-03-20 NOTE — Telephone Encounter (Signed)
 Decrease Torsemide  to 80/40 and KCL to 60/40. Please update medication list to reflect changes. BMET 2 weeks Basic metabolic panel View older events  Allison, Emer M, RN to Arleene Belt, [place]  [facility]     03/19/24  3:42 PM  Called patient she confirmed she was taking 80 mg Twice daily of Torsemide  and 60mEq KCL bid    Pt aware and voiced understanding Will return call to schedule labs

## 2024-04-18 ENCOUNTER — Other Ambulatory Visit (HOSPITAL_COMMUNITY): Payer: Self-pay | Admitting: Cardiology

## 2024-04-18 DIAGNOSIS — I5032 Chronic diastolic (congestive) heart failure: Secondary | ICD-10-CM

## 2024-04-24 ENCOUNTER — Other Ambulatory Visit (HOSPITAL_COMMUNITY): Payer: Self-pay | Admitting: Cardiology

## 2024-05-02 LAB — BASIC METABOLIC PANEL WITH GFR
BUN/Creatinine Ratio: 22 (ref 12–28)
BUN: 44 mg/dL — ABNORMAL HIGH (ref 8–27)
CO2: 28 mmol/L (ref 20–29)
Calcium: 9.3 mg/dL (ref 8.7–10.3)
Chloride: 89 mmol/L — ABNORMAL LOW (ref 96–106)
Creatinine, Ser: 2.02 mg/dL — ABNORMAL HIGH (ref 0.57–1.00)
Glucose: 234 mg/dL — ABNORMAL HIGH (ref 70–99)
Potassium: 5.4 mmol/L — ABNORMAL HIGH (ref 3.5–5.2)
Sodium: 133 mmol/L — ABNORMAL LOW (ref 134–144)
eGFR: 25 mL/min/{1.73_m2} — ABNORMAL LOW (ref >59)

## 2024-05-05 ENCOUNTER — Other Ambulatory Visit (HOSPITAL_COMMUNITY): Payer: Self-pay | Admitting: Cardiology

## 2024-05-06 ENCOUNTER — Ambulatory Visit (HOSPITAL_COMMUNITY): Payer: Self-pay | Admitting: Physician Assistant

## 2024-05-06 DIAGNOSIS — I5032 Chronic diastolic (congestive) heart failure: Secondary | ICD-10-CM

## 2024-05-06 DIAGNOSIS — N183 Chronic kidney disease, stage 3 unspecified: Secondary | ICD-10-CM

## 2024-05-07 MED ORDER — POTASSIUM CHLORIDE CRYS ER 10 MEQ PO TBCR: 20.0000 meq | EXTENDED_RELEASE_TABLET | Freq: Every day | ORAL | 3 refills | Status: DC

## 2024-05-07 MED ORDER — TORSEMIDE 20 MG PO TABS: 80.0000 mg | ORAL_TABLET | Freq: Two times a day (BID) | ORAL | 6 refills | Status: DC

## 2024-05-15 ENCOUNTER — Other Ambulatory Visit (HOSPITAL_COMMUNITY): Payer: Self-pay | Admitting: Cardiology

## 2024-05-15 DIAGNOSIS — I5032 Chronic diastolic (congestive) heart failure: Secondary | ICD-10-CM

## 2024-05-16 LAB — BASIC METABOLIC PANEL WITH GFR
BUN/Creatinine Ratio: 20 (ref 12–28)
BUN: 39 mg/dL — ABNORMAL HIGH (ref 8–27)
CO2: 29 mmol/L (ref 20–29)
Calcium: 9.4 mg/dL (ref 8.7–10.3)
Chloride: 89 mmol/L — ABNORMAL LOW (ref 96–106)
Creatinine, Ser: 1.97 mg/dL — ABNORMAL HIGH (ref 0.57–1.00)
Glucose: 213 mg/dL — ABNORMAL HIGH (ref 70–99)
Potassium: 3.7 mmol/L (ref 3.5–5.2)
Sodium: 137 mmol/L (ref 134–144)
eGFR: 26 mL/min/{1.73_m2} — ABNORMAL LOW (ref >59)

## 2024-05-19 ENCOUNTER — Ambulatory Visit (HOSPITAL_COMMUNITY): Payer: Self-pay | Admitting: Physician Assistant

## 2024-06-02 ENCOUNTER — Other Ambulatory Visit (HOSPITAL_COMMUNITY): Payer: Self-pay | Admitting: Cardiology

## 2024-06-16 ENCOUNTER — Encounter (HOSPITAL_COMMUNITY)

## 2024-06-25 ENCOUNTER — Telehealth (HOSPITAL_COMMUNITY): Payer: Self-pay

## 2024-06-25 NOTE — Progress Notes (Signed)
 PCP: Orpha Yancey LABOR, MD Cardiology: Dr. Alvan HF Cardiology: Dr. Rolan  Chief complaint: CHF  78 y.o. with history of CAD, diastolic CHF, and poorly controlled HTN was referred by Dr. Alvan for CHF evaluation. Patient had CABG in 2008.  She has poorly controlled HTN and CKD stage 3.  She was admitted in 10/21 with hypertensive urgency and CHF due to medication noncompliance.  She was admitted again in 3/22 with acute diastolic CHF, AKI to 2.9. HS-TnI was 1777 but cath was not done due to AKI.  Echo showed EF 60-65% with normal RV.  She was again admitted (this time to [facility]) in 5/22 with CHF.  No med changes were made.  Given multiple admission with CHF and difficulty managing volume, she was referred to CHF clinic.  Torsemide  was increased and Jardiance  was started.   LHC/RHC in 6/22 showed mildly elevated RA pressure, normal PCWP.  LIMA-LAD and SVG-PDA were patent, sequential SVG-OM and ramus only touched down on OM.  Native RCA has a heavily calcified ostium with 95% stenosis, very difficult to seat the catheter due to calcification.  The native PDA is occluded at the ostium, and SVG-PDA only fills the PDA (does not back-fill to PLV and RCA).  Therefore, PLV territory is likely jeopardized by the ostial RCA stenosis. I reviewed the films with Dr. [place].  Potentially could intervene on ostial RCA using atherectomy (would be difficult).  As patient's primary symptoms have been CHF-related and this was improved with diuresis, will manage medically now with option for PCI if she develops chest pain, etc.  Echo was done today and reviewed, EF 55-60%, mild LVH, moderate diastolic dysfunction, normal RV size/systolic function.   Today she returns for HF follow up. Weight up 3 lbs.  She stopped taking Farxiga  as it was too expensive.  She is going to be starting Mounjaro .  She is short of breath walking across the house.  She is mildly short of breath dressing and bathing.  No chest pain.  No  lightheadedness.  SBP generally around 130 at home.    Cardiomems today: PADP 19, goal 18   ECG (personally reviewed): NSR, inferior and anterolateral Qs  Labs (5/22): Pro-BNP 2347, K 4.1, creatinine 1.46 Labs (6/22): K 3.9, creatinine 1.75 Labs (11/22): K 4.3, creatinine 1.5 Labs (5/23): LDL 59 Labs (6/23): K 4.7, creatinine 1.99 Labs (9/23): K 4.9, creatinine 1.75 Labs (10/23): K 4.2, creatinine 1.85 Labs (12/23): K 4.3, creatinine 1.71, LDL 60 Labs (1/24): K 3.8, creatinine 1.64 Labs (12/24): LDL 65, K 3.8, creatinine 8.23, BNP 534  PMH: 1. Hyperlipidemia 2. HTN 3. CAD: CABG 2008 with LIMA-LAD, SVG-ramus, SVG-OM, SVG-PDA.  - Cardiolite (11/21): Small reversible inferolateral defect.  - LHC (6/22): LIMA-LAD and SVG-PDA were patent, sequential SVG-OM and ramus only touched down on OM.  Native RCA has a heavily calcified ostium with 95% stenosis, very difficult to seat the catheter due to calcification.  The native PDA is occluded at the ostium, and SVG-PDA only fills the PDA (does not back-fill to PLV and RCA).  Therefore, PLV territory is likely jeopardized by the ostial RCA stenosis. I reviewed the films with Dr. [place].  Potentially could intervene on ostial RCA using atherectomy (would be difficult).  As patient's primary symptoms are CHF-related and this is improved with diuresis, will manage medically now with option for PCI if she develops chest pain, etc. 4. Chronic diastolic CHF: Echo (3/22) with EF 60-65%, normal RV.  - Multiple admission with CHF.  -  RHC (6/22): mean RA 10, PA 42/15 mean 25, mean PCWP 13, CI 3.72, PVR 1.7 WU.  - Cardiomems - Echo (3/25): EF 55-60%, mild LVH, moderate diastolic dysfunction, normal RV size/systolic function.  5. CKD: Stage 3.   SH: Lives in [place], retired from [employer], lives alone, has a son in [place].  Quit smoking in 2003, no ETOH.   Family History  Problem Relation Age of Onset   Cancer Other        Family Hx  of Cancer   Coronary artery disease Other        Family Hx of CAD   ROS: All systems reviewed and negative except as per HPI.   Current Outpatient Medications  Medication Sig Dispense Refill   acetaminophen  (TYLENOL ) 500 MG tablet Take 500-1,000 mg by mouth every 6 (six) hours as needed (pain).     aspirin  EC 81 MG tablet Take 81 mg by mouth every evening. Swallow whole.     atorvastatin  (LIPITOR) 80 MG tablet Take 1 tablet (80 mg total) by mouth every evening. 90 tablet 3   FARXIGA  10 MG TABS tablet Take 1 tablet (10 mg total) by mouth daily before breakfast. 90 tablet 3   HUMALOG 100 UNIT/ML injection Inject 0.75 Units into the skin with breakfast, with lunch, and with evening meal. With insulin  pump 24 hours adjust sometime when eat     hydrALAZINE  (APRESOLINE ) 50 MG tablet Take 50 mg by mouth 3 (three) times daily.     Insulin  Disposable Pump (OMNIPOD DASH PODS, GEN 4,) MISC Take by mouth every 3 (three) days.     ipratropium (ATROVENT) 0.03 % nasal spray Place 1 spray into both nostrils 4 (four) times daily as needed (allergies).     isosorbide  dinitrate (ISORDIL ) 20 MG tablet TAKE 1 TABLET BY MOUTH THREE TIMES DAILY 90 tablet 3   levocetirizine (XYZAL) 5 MG tablet Take 5 mg by mouth as needed for allergies.     loratadine (CLARITIN) 10 MG tablet Take 10 mg by mouth daily as needed for allergies.     metolazone  (ZAROXOLYN ) 2.5 MG tablet Take 1 tablet by mouth once a week 12 tablet 1   nitroGLYCERIN  (NITROSTAT ) 0.4 MG SL tablet Place 1 tablet (0.4 mg total) under the tongue every 5 (five) minutes as needed for chest pain. 25 tablet 1   omeprazole (PRILOSEC) 20 MG capsule Take 20 mg by mouth in the morning.     OXYGEN  Inhale 2 L into the lungs daily as needed (Active).     PE-Triprolidine-DM-GG-APAP (MUCINEX CNG/CGH/COLD/FLU DY/NT PO) Patient takes 2 tablets every 4 hours as needed.     potassium chloride  (KLOR-CON  M) 10 MEQ tablet Take 2 tablets (20 mEq total) by mouth daily. 60 tablet 3    spironolactone  (ALDACTONE ) 25 MG tablet Take 1 tablet by mouth once daily 90 tablet 0   tirzepatide  (MOUNJARO ) 2.5 MG/0.5ML Pen Inject 2.5 mg into the skin once a week. 2 mL 0   tirzepatide  (MOUNJARO ) 5 MG/0.5ML Pen Inject 5 mg into the skin once a week. 2 mL 0   torsemide  (DEMADEX ) 20 MG tablet Take 4 tablets (80 mg total) by mouth 2 (two) times daily. 240 tablet 6   traMADol (ULTRAM) 50 MG tablet Take 50 mg by mouth 3 (three) times daily as needed (arthritis pain).     TRELEGY ELLIPTA 200-62.5-25 MCG/ACT AEPB Inhale 1 puff into the lungs daily.     No current facility-administered medications for this  visit.   Wt Readings from Last 3 Encounters:  02/14/24 96.2 kg (212 lb)  02/14/24 96.3 kg (212 lb 6.4 oz)  11/09/23 95 kg (209 lb 8 oz)   There were no vitals taken for this visit. General: NAD Neck: JVP 8-9 cm, no thyromegaly or thyroid  nodule.  Lungs: Clear to auscultation bilaterally with normal respiratory effort. CV: Nondisplaced PMI.  Heart regular S1/S2, no S3/S4, no murmur.  1+ left ankle edema.  No carotid bruit.  Normal pedal pulses.  Abdomen: Soft, nontender, no hepatosplenomegaly, no distention.  Skin: Intact without lesions or rashes.  Neurologic: Alert and oriented x 3.  Psych: Normal affect. Extremities: No clubbing or cyanosis.  HEENT: Normal.   Assessment/Plan: 1. Chronic diastolic CHF: Echo in 3/22 with EF 60-65%, normal RV.  She has had only mild LVH on echo so suspect cardiac amyloidosis is unlikely.  Multiple CHF admissions.  Volume management is complicated by CKD stage 3.  Echo today showed EF 55-60%, mild LVH, moderate diastolic dysfunction, normal RV size/systolic function. NYHA ***. Volume**, CardioMEMs PADP at *** (goal 18).  - Continue metolazone  to 2.5 mg once weekly.   - Continue torsemide  100 mg bid and KCl 100 bid - Continue Farxiga  10 mg daily  - Continue spironolactone  25 mg daily - Check BMP today  2. HTN: BP controlled.  - Continue Hydralazine   50 mg tid.  - Continue Spironolactone  25 mg daily  3. CAD: S/p CABG in 2008.  HS-TnI up to 1777 during 3/22 admission but no cath due to AKI.  She has prominent exertional dyspnea but only atypical chest pain.  LHC in 6/22 showed that LIMA-LAD and SVG-PDA were patent, sequential SVG-OM and ramus only touched down on OM.  Native RCA has a heavily calcified ostium with 95% stenosis, very difficult to seat the catheter due to calcification.  The native PDA is occluded at the ostium, and SVG-PDA only fills the PDA (does not back-fill to PLV and RCA).  Therefore, PLV territory is likely jeopardized by the ostial RCA stenosis. I reviewed the films with Dr. Swaziland.  Potentially could intervene on ostial RCA using atherectomy (would be difficult).  As patient's primary symptoms are CHF-related, will manage medically now with option for PCI if she develops chest pain, etc. No chest pain.  - Continue ASA 81 daily.  - Goal LDL < 55, Atorvastatin  increased to 80 mg daily last OV. Repeat LP and HFTs today  4. Suspect OSA: She says that she would not wear CPAP, so sleep study not ordered.  5. CKD stage 3: baseline SCr 1.8-2.0 - on SGLT2i - check BMP today  6. Type 2 Diabetes: On insulin  pump per PCP.  - On Farxiga  - To start Mounjaro .  7. Obesity: There is no height or weight on file to calculate BMI. - To start Mounjaro .  8. OHS: Uses oxygen  prn.   9. Asymmetric left lower leg swelling: Chronic, venous dopplers in 1/24 showed no DVT.   F/u    Caffie Shed  06/25/2024

## 2024-06-25 NOTE — Telephone Encounter (Signed)
 Called to confirm/remind patient of their appointment at the [facility] on 06/26/24.   Appointment:   [x] Confirmed  [] Left mess   [] No answer/No voice mail  [] VM Full/unable to leave message  [] Phone not in service  Patient reminded to bring all medications and/or complete list.  Confirmed patient has transportation. Gave directions, instructed to utilize valet parking.

## 2024-06-26 ENCOUNTER — Other Ambulatory Visit (HOSPITAL_COMMUNITY): Payer: Self-pay | Admitting: Cardiology

## 2024-06-26 ENCOUNTER — Inpatient Hospital Stay (HOSPITAL_COMMUNITY)
Admission: RE | Admit: 2024-06-26 | Discharge: 2024-06-26 | Disposition: A | Discharge status: Home / Self Care | Source: Ambulatory Visit | Attending: Cardiology | Admitting: Cardiology

## 2024-06-26 ENCOUNTER — Encounter (HOSPITAL_COMMUNITY): Payer: Self-pay

## 2024-06-26 ENCOUNTER — Ambulatory Visit (HOSPITAL_COMMUNITY)
Admission: RE | Admit: 2024-06-26 | Discharge: 2024-06-26 | Disposition: A | Discharge status: Home / Self Care | Source: Ambulatory Visit | Attending: Cardiology | Admitting: Cardiology

## 2024-06-26 VITALS — BP 130/70 | HR 43 | Wt 199.4 lb

## 2024-06-26 DIAGNOSIS — E669 Obesity, unspecified: Secondary | ICD-10-CM | POA: Insufficient documentation

## 2024-06-26 DIAGNOSIS — M7989 Other specified soft tissue disorders: Secondary | ICD-10-CM | POA: Insufficient documentation

## 2024-06-26 DIAGNOSIS — Z794 Long term (current) use of insulin: Secondary | ICD-10-CM | POA: Insufficient documentation

## 2024-06-26 DIAGNOSIS — Z87891 Personal history of nicotine dependence: Secondary | ICD-10-CM | POA: Diagnosis not present

## 2024-06-26 DIAGNOSIS — N183 Chronic kidney disease, stage 3 unspecified: Secondary | ICD-10-CM | POA: Diagnosis not present

## 2024-06-26 DIAGNOSIS — Z6838 Body mass index (BMI) 38.0-38.9, adult: Secondary | ICD-10-CM | POA: Diagnosis not present

## 2024-06-26 DIAGNOSIS — Z79899 Other long term (current) drug therapy: Secondary | ICD-10-CM | POA: Diagnosis not present

## 2024-06-26 DIAGNOSIS — R001 Bradycardia, unspecified: Secondary | ICD-10-CM

## 2024-06-26 DIAGNOSIS — Z7984 Long term (current) use of oral hypoglycemic drugs: Secondary | ICD-10-CM | POA: Diagnosis not present

## 2024-06-26 DIAGNOSIS — Z951 Presence of aortocoronary bypass graft: Secondary | ICD-10-CM | POA: Insufficient documentation

## 2024-06-26 DIAGNOSIS — Z9981 Dependence on supplemental oxygen: Secondary | ICD-10-CM | POA: Diagnosis not present

## 2024-06-26 DIAGNOSIS — I5032 Chronic diastolic (congestive) heart failure: Secondary | ICD-10-CM | POA: Diagnosis present

## 2024-06-26 DIAGNOSIS — Z9641 Presence of insulin pump (external) (internal): Secondary | ICD-10-CM | POA: Insufficient documentation

## 2024-06-26 DIAGNOSIS — Z7982 Long term (current) use of aspirin: Secondary | ICD-10-CM | POA: Diagnosis not present

## 2024-06-26 DIAGNOSIS — I2510 Atherosclerotic heart disease of native coronary artery without angina pectoris: Secondary | ICD-10-CM | POA: Insufficient documentation

## 2024-06-26 DIAGNOSIS — R0609 Other forms of dyspnea: Secondary | ICD-10-CM | POA: Diagnosis not present

## 2024-06-26 DIAGNOSIS — R0789 Other chest pain: Secondary | ICD-10-CM | POA: Diagnosis not present

## 2024-06-26 DIAGNOSIS — E1122 Type 2 diabetes mellitus with diabetic chronic kidney disease: Secondary | ICD-10-CM | POA: Diagnosis not present

## 2024-06-26 DIAGNOSIS — I130 Hypertensive heart and chronic kidney disease with heart failure and stage 1 through stage 4 chronic kidney disease, or unspecified chronic kidney disease: Secondary | ICD-10-CM | POA: Diagnosis not present

## 2024-06-26 LAB — BASIC METABOLIC PANEL WITH GFR
Anion gap: 13 (ref 5–15)
BUN: 43 mg/dL — ABNORMAL HIGH (ref 8–23)
CO2: 33 mmol/L — ABNORMAL HIGH (ref 22–32)
Calcium: 9 mg/dL (ref 8.9–10.3)
Chloride: 85 mmol/L — ABNORMAL LOW (ref 98–111)
Creatinine, Ser: 1.95 mg/dL — ABNORMAL HIGH (ref 0.44–1.00)
GFR, Estimated: 26 mL/min — ABNORMAL LOW (ref >60)
Glucose, Bld: 175 mg/dL — ABNORMAL HIGH (ref 70–99)
Potassium: 3.5 mmol/L (ref 3.5–5.1)
Sodium: 131 mmol/L — ABNORMAL LOW (ref 135–145)

## 2024-06-26 LAB — TSH: TSH: 2.352 u[IU]/mL (ref 0.350–4.500)

## 2024-06-26 LAB — LIPID PANEL
Cholesterol: 127 mg/dL (ref 0–200)
HDL: 43 mg/dL (ref >40)
LDL Cholesterol: 57 mg/dL (ref 0–99)
Total CHOL/HDL Ratio: 3 ratio
Triglycerides: 135 mg/dL (ref <150)
VLDL: 27 mg/dL (ref 0–40)

## 2024-06-26 LAB — HEPATIC FUNCTION PANEL
ALT: 9 U/L (ref 0–44)
AST: 9 U/L — ABNORMAL LOW (ref 15–41)
Albumin: 3.2 g/dL — ABNORMAL LOW (ref 3.5–5.0)
Alkaline Phosphatase: 84 U/L (ref 38–126)
Bilirubin, Direct: <0.1 mg/dL (ref 0.0–0.2)
Total Bilirubin: 0.7 mg/dL (ref 0.0–1.2)
Total Protein: 6.5 g/dL (ref 6.5–8.1)

## 2024-06-26 NOTE — Patient Instructions (Signed)
 Thank you for coming in today  If you had labs drawn today, any labs that are abnormal the clinic will call you No news is good news  Your provider has recommended that  you wear a Zio Patch for 7 days.  This monitor will record your heart rhythm for our review.  IF you have any symptoms while wearing the monitor please press the button.  If you have any issues with the patch or you notice a red or orange light on it please call the company at [phone].  Once you remove the patch please mail it back to the company as soon as possible so we can get the results.   Medications: No changes  Follow up appointments:  Your physician recommends that you schedule a follow-up appointment in:  2 weeks With Dr. Rolan    Do the following things EVERYDAY: Weigh yourself in the morning before breakfast. Write it down and keep it in a log. Take your medicines as prescribed Eat low salt foods--Limit salt (sodium) to 2000 mg per day.  Stay as active as you can everyday Limit all fluids for the day to less than 2 liters   At the [facility], you and your health needs are our priority. As part of our continuing mission to provide you with exceptional heart care, we have created designated Provider Care Teams. These Care Teams include your primary Cardiologist (physician) and Advanced Practice Providers (APPs- Physician Assistants and Nurse Practitioners) who all work together to provide you with the care you need, when you need it.   You may see any of the following providers on your designated Care Team at your next follow up: Dr Toribio Fuel Dr Ezra Rolan Dr. Ria Gardenia Greig Lenetta, NP Caffie Shed, [place] [facility] [place], [place] Beckey Coe, NP Tinnie Redman, PharmD   Please be sure to bring in all your medications bottles to every appointment.    Thank you for choosing [facility]  If you have any  questions or concerns before your next appointment please send us  a message through [place] or call our office at [phone].    TO LEAVE A MESSAGE FOR THE NURSE SELECT OPTION 2, PLEASE LEAVE A MESSAGE INCLUDING: YOUR NAME DATE OF BIRTH CALL BACK NUMBER REASON FOR CALL**this is important as we prioritize the call backs  YOU WILL RECEIVE A CALL BACK THE SAME DAY AS LONG AS YOU CALL BEFORE 4:00 PM

## 2024-06-27 ENCOUNTER — Ambulatory Visit (HOSPITAL_COMMUNITY): Payer: Self-pay | Admitting: Cardiology

## 2024-07-10 ENCOUNTER — Encounter (HOSPITAL_COMMUNITY): Payer: Self-pay | Admitting: Cardiology

## 2024-07-10 ENCOUNTER — Ambulatory Visit (HOSPITAL_COMMUNITY)
Admission: RE | Admit: 2024-07-10 | Discharge: 2024-07-10 | Disposition: A | Discharge status: Home / Self Care | Source: Ambulatory Visit | Attending: Cardiology | Admitting: Cardiology

## 2024-07-10 ENCOUNTER — Ambulatory Visit (HOSPITAL_COMMUNITY): Payer: Self-pay | Admitting: Cardiology

## 2024-07-10 VITALS — BP 140/58 | HR 50 | Ht 60.0 in | Wt 200.6 lb

## 2024-07-10 DIAGNOSIS — J449 Chronic obstructive pulmonary disease, unspecified: Secondary | ICD-10-CM

## 2024-07-10 DIAGNOSIS — R001 Bradycardia, unspecified: Secondary | ICD-10-CM | POA: Diagnosis not present

## 2024-07-10 DIAGNOSIS — E1122 Type 2 diabetes mellitus with diabetic chronic kidney disease: Secondary | ICD-10-CM | POA: Insufficient documentation

## 2024-07-10 DIAGNOSIS — R9431 Abnormal electrocardiogram [ECG] [EKG]: Secondary | ICD-10-CM | POA: Insufficient documentation

## 2024-07-10 DIAGNOSIS — Z91199 Patient's noncompliance with other medical treatment and regimen due to unspecified reason: Secondary | ICD-10-CM | POA: Insufficient documentation

## 2024-07-10 DIAGNOSIS — I5032 Chronic diastolic (congestive) heart failure: Secondary | ICD-10-CM | POA: Diagnosis present

## 2024-07-10 DIAGNOSIS — E669 Obesity, unspecified: Secondary | ICD-10-CM | POA: Insufficient documentation

## 2024-07-10 DIAGNOSIS — M549 Dorsalgia, unspecified: Secondary | ICD-10-CM | POA: Insufficient documentation

## 2024-07-10 DIAGNOSIS — Z794 Long term (current) use of insulin: Secondary | ICD-10-CM | POA: Insufficient documentation

## 2024-07-10 DIAGNOSIS — I130 Hypertensive heart and chronic kidney disease with heart failure and stage 1 through stage 4 chronic kidney disease, or unspecified chronic kidney disease: Secondary | ICD-10-CM | POA: Diagnosis not present

## 2024-07-10 DIAGNOSIS — R5383 Other fatigue: Secondary | ICD-10-CM | POA: Diagnosis not present

## 2024-07-10 DIAGNOSIS — Z6839 Body mass index (BMI) 39.0-39.9, adult: Secondary | ICD-10-CM | POA: Insufficient documentation

## 2024-07-10 DIAGNOSIS — Z7982 Long term (current) use of aspirin: Secondary | ICD-10-CM | POA: Insufficient documentation

## 2024-07-10 DIAGNOSIS — R0789 Other chest pain: Secondary | ICD-10-CM | POA: Diagnosis not present

## 2024-07-10 DIAGNOSIS — I2510 Atherosclerotic heart disease of native coronary artery without angina pectoris: Secondary | ICD-10-CM | POA: Diagnosis not present

## 2024-07-10 DIAGNOSIS — M25559 Pain in unspecified hip: Secondary | ICD-10-CM | POA: Diagnosis not present

## 2024-07-10 DIAGNOSIS — Z9641 Presence of insulin pump (external) (internal): Secondary | ICD-10-CM | POA: Insufficient documentation

## 2024-07-10 DIAGNOSIS — Z7984 Long term (current) use of oral hypoglycemic drugs: Secondary | ICD-10-CM | POA: Insufficient documentation

## 2024-07-10 DIAGNOSIS — N183 Chronic kidney disease, stage 3 unspecified: Secondary | ICD-10-CM | POA: Insufficient documentation

## 2024-07-10 DIAGNOSIS — Z79899 Other long term (current) drug therapy: Secondary | ICD-10-CM | POA: Insufficient documentation

## 2024-07-10 DIAGNOSIS — Z87891 Personal history of nicotine dependence: Secondary | ICD-10-CM | POA: Insufficient documentation

## 2024-07-10 DIAGNOSIS — Z951 Presence of aortocoronary bypass graft: Secondary | ICD-10-CM | POA: Insufficient documentation

## 2024-07-10 LAB — CBC
HCT: 37.6 % (ref 36.0–46.0)
Hemoglobin: 12.5 g/dL (ref 12.0–15.0)
MCH: 27.4 pg (ref 26.0–34.0)
MCHC: 33.2 g/dL (ref 30.0–36.0)
MCV: 82.5 fL (ref 80.0–100.0)
Platelets: 249 K/uL (ref 150–400)
RBC: 4.56 MIL/uL (ref 3.87–5.11)
RDW: 15.5 % (ref 11.5–15.5)
WBC: 10.7 K/uL — ABNORMAL HIGH (ref 4.0–10.5)
nRBC: 0 % (ref 0.0–0.2)

## 2024-07-10 LAB — BASIC METABOLIC PANEL WITH GFR
Anion gap: 14 (ref 5–15)
BUN: 41 mg/dL — ABNORMAL HIGH (ref 8–23)
CO2: 29 mmol/L (ref 22–32)
Calcium: 8.8 mg/dL — ABNORMAL LOW (ref 8.9–10.3)
Chloride: 89 mmol/L — ABNORMAL LOW (ref 98–111)
Creatinine, Ser: 1.92 mg/dL — ABNORMAL HIGH (ref 0.44–1.00)
GFR, Estimated: 27 mL/min — ABNORMAL LOW (ref >60)
Glucose, Bld: 247 mg/dL — ABNORMAL HIGH (ref 70–99)
Potassium: 3.7 mmol/L (ref 3.5–5.1)
Sodium: 132 mmol/L — ABNORMAL LOW (ref 135–145)

## 2024-07-10 LAB — MAGNESIUM: Magnesium: 2.1 mg/dL (ref 1.7–2.4)

## 2024-07-10 LAB — BRAIN NATRIURETIC PEPTIDE: B Natriuretic Peptide: 556.9 pg/mL — ABNORMAL HIGH (ref 0.0–100.0)

## 2024-07-10 NOTE — Progress Notes (Signed)
 PCP: Orpha Yancey LABOR, MD Cardiology: Dr. Alvan HF Cardiology: Dr. Rolan  Chief complaint: CHF  78 y.o. with history of CAD, diastolic CHF, and poorly controlled HTN was referred by Dr. Alvan for CHF evaluation. Patient had CABG in 2008.  She has poorly controlled HTN and CKD stage 3.  She was admitted in 10/21 with hypertensive urgency and CHF due to medication noncompliance.  She was admitted again in 3/22 with acute diastolic CHF, AKI to 2.9. HS-TnI was 1777 but cath was not done due to AKI.  Echo showed EF 60-65% with normal RV.  She was again admitted (this time to [facility]) in 5/22 with CHF.  No med changes were made.  Given multiple admission with CHF and difficulty managing volume, she was referred to CHF clinic.  Torsemide  was increased and Jardiance  was started.   LHC/RHC in 6/22 showed mildly elevated RA pressure, normal PCWP.  LIMA-LAD and SVG-PDA were patent, sequential SVG-OM and ramus only touched down on OM.  Native RCA has a heavily calcified ostium with 95% stenosis, very difficult to seat the catheter due to calcification.  The native PDA is occluded at the ostium, and SVG-PDA only fills the PDA (does not back-fill to PLV and RCA).  Therefore, PLV territory is likely jeopardized by the ostial RCA stenosis. I reviewed the films with Dr. [place]azil[place].  Potentially could intervene on ostial RCA using atherectomy (would be difficult).  As patient's primary symptoms have been CHF-related and this was improved with diuresis, will manage medically now with option for PCI if she develops chest pain, etc.  Echo in 3/25 showed EF 55-60%, mild LVH, moderate diastolic dysfunction, normal RV size/systolic function.   Bradycardia has been noted, she completed a Zio monitor but results are not yet back.   Today she returns for HF follow up. Weight stable.  She is taking torsemide  100 mg daily and metolazone  once weekly. BP mildly elevated today but lower when she checks at home.  HR around 50  today, HR increases to 60s walking in hall.  She is in NSR. Oxygen  saturation dropped to 85% with her walk, she has oxygen  at home but has not been using it. Breathing stable, more limited by hip and back pain. She rides the cart in the grocery store.  No problems walking around her house with a cane. No chest pain.  Fatigues easily walking outside the house.  No lightheadedness, syncope/presyncope. No palpitations.   Cardiomems today: PADP 13, goal 18   ECG (personally reviewed): NSR rate 47, low voltage P's, LVH with repolarization abnormality.   Labs (12/23): K 4.3, creatinine 1.71, LDL 60 Labs (1/24): K 3.8, creatinine 1.64 Labs (12/24): LDL 65, K 3.8, creatinine 8.23, BNP 534 Labs (7/25): LDL 57, TSH normal, K 3.5, creatinine 1.95  PMH: 1. Hyperlipidemia 2. HTN 3. CAD: CABG 2008 with LIMA-LAD, SVG-ramus, SVG-OM, SVG-PDA.  - Cardiolite (11/21): Small reversible inferolateral defect.  - LHC (6/22): LIMA-LAD and SVG-PDA were patent, sequential SVG-OM and ramus only touched down on OM.  Native RCA has a heavily calcified ostium with 95% stenosis, very difficult to seat the catheter due to calcification.  The native PDA is occluded at the ostium, and SVG-PDA only fills the PDA (does not back-fill to PLV and RCA).  Therefore, PLV territory is likely jeopardized by the ostial RCA stenosis. I reviewed the films with Dr. [place].  Potentially could intervene on ostial RCA using atherectomy (would be difficult).  As patient's primary symptoms are CHF-related and  this is improved with diuresis, will manage medically now with option for PCI if she develops chest pain, etc. 4. Chronic diastolic CHF: Echo (3/22) with EF 60-65%, normal RV.  - Multiple admission with CHF.  - RHC (6/22): mean RA 10, PA 42/15 mean 25, mean PCWP 13, CI 3.72, PVR 1.7 WU.  - Cardiomems - Echo (3/25): EF 55-60%, mild LVH, moderate diastolic dysfunction, normal RV size/systolic function.  5. CKD: Stage 3.  6. Sinus  bradycardia 7. COPD  SH: Lives in [place], retired from [employer], lives alone, has a son in [place].  Quit smoking in 2003, no ETOH.   Family History  Problem Relation Age of Onset   Cancer Other        Family Hx of Cancer   Coronary artery disease Other        Family Hx of CAD   ROS: All systems reviewed and negative except as per HPI.   Current Outpatient Medications  Medication Sig Dispense Refill   acetaminophen  (TYLENOL ) 500 MG tablet Take 500-1,000 mg by mouth every 6 (six) hours as needed (pain).     aspirin  EC 81 MG tablet Take 81 mg by mouth every evening. Swallow whole.     atorvastatin  (LIPITOR) 80 MG tablet Take 1 tablet (80 mg total) by mouth every evening. 90 tablet 3   FARXIGA  10 MG TABS tablet Take 1 tablet (10 mg total) by mouth daily before breakfast. 90 tablet 3   HUMALOG 100 UNIT/ML injection Inject 0.75 Units into the skin with breakfast, with lunch, and with evening meal. With insulin  pump 24 hours adjust sometime when eat     hydrALAZINE  (APRESOLINE ) 50 MG tablet Take 50 mg by mouth 3 (three) times daily.     Insulin  Disposable Pump (OMNIPOD DASH PODS, GEN 4,) MISC Take by mouth every 3 (three) days.     ipratropium (ATROVENT) 0.03 % nasal spray Place 1 spray into both nostrils 4 (four) times daily as needed (allergies).     isosorbide  dinitrate (ISORDIL ) 20 MG tablet TAKE 1 TABLET BY MOUTH THREE TIMES DAILY 90 tablet 3   linaCLOtide (LINZESS PO) Take 1 tablet by mouth daily.     loratadine (CLARITIN) 10 MG tablet Take 10 mg by mouth daily as needed for allergies.     metolazone  (ZAROXOLYN ) 2.5 MG tablet Take 1 tablet by mouth once a week 12 tablet 1   nitroGLYCERIN  (NITROSTAT ) 0.4 MG SL tablet Place 1 tablet (0.4 mg total) under the tongue every 5 (five) minutes as needed for chest pain. 25 tablet 1   omeprazole (PRILOSEC) 20 MG capsule Take 20 mg by mouth in the morning.     OXYGEN  Inhale 2 L into the lungs daily as needed (Active).      PE-Triprolidine-DM-GG-APAP (MUCINEX CNG/CGH/COLD/FLU DY/NT PO) Patient takes 2 tablets every 4 hours as needed.     potassium chloride  (KLOR-CON  M) 10 MEQ tablet Take 2 tablets (20 mEq total) by mouth daily. 60 tablet 3   spironolactone  (ALDACTONE ) 25 MG tablet Take 1 tablet by mouth once daily 90 tablet 0   torsemide  (DEMADEX ) 20 MG tablet Take 80 mg by mouth daily. (Patient taking differently: Take 100 mg by mouth daily.)     traMADol (ULTRAM) 50 MG tablet Take 50 mg by mouth 3 (three) times daily as needed (arthritis pain).     TRELEGY ELLIPTA 200-62.5-25 MCG/ACT AEPB Inhale 1 puff into the lungs daily.     No current facility-administered medications  for this encounter.   Wt Readings from Last 3 Encounters:  07/10/24 91 kg (200 lb 9.6 oz)  06/26/24 90.4 kg (199 lb 6.4 oz)  02/14/24 96.2 kg (212 lb)   BP (!) 140/58   Pulse (!) 50   Ht 5' (1.524 m)   Wt 91 kg (200 lb 9.6 oz)   SpO2 94%   BMI 39.18 kg/m  General: NAD Neck: No JVD, no thyromegaly or thyroid  nodule.  Lungs: Distant breath sounds.  CV: Nondisplaced PMI.  Heart regular S1/S2, no S3/S4, no murmur.  No peripheral edema.  No carotid bruit.  Normal pedal pulses.  Abdomen: Soft, nontender, no hepatosplenomegaly, no distention.  Skin: Intact without lesions or rashes.  Neurologic: Alert and oriented x 3.  Psych: Normal affect. Extremities: No clubbing or cyanosis.  HEENT: Normal.   Assessment/Plan: 1. Chronic diastolic CHF: Echo in 3/22 with EF 60-65%, normal RV.  She has had only mild LVH on echo so suspect cardiac amyloidosis is unlikely.  Multiple CHF admissions.  Volume management is complicated by CKD stage 3.  Echo in 3/25 showed EF 55-60%, mild LVH, moderate diastolic dysfunction, normal RV size/systolic function. Chronic NYHA class III symptoms.  Symptoms complicated by suspected COPD (on Trelegy), oxygen  saturation dropped to 85% on room air with walking in hall. She is not volume overloaded by exam or Cardiomems.   - Continue metolazone  to 2.5 mg once weekly.  BMET/BNP today. - Continue torsemide  100 mg bid and KCl at current dose.  - Continue Farxiga  10 mg daily.   - Continue spironolactone  25 mg daily 2. HTN: BP controlled at home.  - Continue hydralazine  50 tid.  - Continue spironolactone  25 mg daily.  3. CAD: S/p CABG in 2008.  HS-TnI up to 1777 during 3/22 admission but no cath due to AKI.  She has prominent exertional dyspnea but only atypical chest pain.  LHC in 6/22 showed that LIMA-LAD and SVG-PDA were patent, sequential SVG-OM and ramus only touched down on OM.  Native RCA has a heavily calcified ostium with 95% stenosis, very difficult to seat the catheter due to calcification.  The native PDA is occluded at the ostium, and SVG-PDA only fills the PDA (does not back-fill to PLV and RCA).  Therefore, PLV territory is likely jeopardized by the ostial RCA stenosis. I reviewed the films with Dr. [place].  Potentially could intervene on ostial RCA using atherectomy (would be difficult).  As patient's primary symptoms are CHF-related, will manage medically now with option for PCI if she develops chest pain, etc. No chest pain.  - Continue ASA 81 daily.  - Continue atorvastatin , good LDL in 7/25.  4. Suspect OSA: She says that she would not wear CPAP, so sleep study not ordered.  5. CKD stage 3: Last creatinine 1.95. BMET today.  6. Type 2 Diabetes: On insulin  pump per PCP.  - On Farxiga  7. Obesity: Body mass index is 39.18 kg/m. - Consider GLP-1 agonist.  8. COPD: Suspect COPD, she is on Trelegy.  Prior smoker.  Oxygen  saturation drops to 85% with exertion.  - Needs to use oxygen  with exertion, has at home.  - I will arrange for PFTs.  9. Sinus bradycardia: Mild, HR upper 40s-50s, increases to 60s walking in hall. No lightheadedness.  - Avoid nodal blockers.   - Wore Zio monitor x 2 wks, awaiting results.   Follow up 3 months with APP.   I spent 32 minutes reviewing records,  interviewing/examining patient, and managing orders.  Ezra Shuck  07/10/2024

## 2024-07-10 NOTE — Patient Instructions (Addendum)
 Medication Changes:  None, continue current medications  Lab Work:  Labs done today, your results will be available in MyChart, we will contact you for abnormal readings.  Testing:  Your physician has recommended that you have a pulmonary function test. Pulmonary Function Tests are a group of tests that measure how well air moves in and out of your lungs.  Special Instructions // Education:  Do the following things EVERYDAY: Weigh yourself in the morning before breakfast. Write it down and keep it in a log. Take your medicines as prescribed Eat low salt foods--Limit salt (sodium) to 2000 mg per day.  Stay as active as you can everyday Limit all fluids for the day to less than 2 liters   Follow-Up in: 3 months    At the [facility], you and your health needs are our priority. We have a designated team specialized in the treatment of Heart Failure. This Care Team includes your primary Heart Failure Specialized Cardiologist (physician), Advanced Practice Providers (APPs- Physician Assistants and Nurse Practitioners), and Pharmacist who all work together to provide you with the care you need, when you need it.   You may see any of the following providers on your designated Care Team at your next follow up:  Dr. Toribio Fuel Dr. Ezra Shuck Dr. Ria Commander Dr. Odis Brownie Greig Mosses, NP Caffie Shed, [place] [facility] [place], [place] Beckey Coe, NP [place] Lee, NP Tinnie Redman, PharmD   Please be sure to bring in all your medications bottles to every appointment.   Need to Contact Us :  If you have any questions or concerns before your next appointment please send us  a message through [place] or call our office at [phone].    TO LEAVE A MESSAGE FOR THE NURSE SELECT OPTION 2, PLEASE LEAVE A MESSAGE INCLUDING: YOUR NAME DATE OF BIRTH CALL BACK NUMBER REASON FOR CALL**this is important as we prioritize the call backs  YOU WILL  RECEIVE A CALL BACK THE SAME DAY AS LONG AS YOU CALL BEFORE 4:00 PM

## 2024-07-15 NOTE — Addendum Note (Signed)
 Encounter addended by: Debarah Garrison MATSU, RN on: 07/15/2024 1:02 PM  Actions taken: Imaging Exam ended

## 2024-07-16 ENCOUNTER — Ambulatory Visit (HOSPITAL_COMMUNITY): Payer: Self-pay | Admitting: Cardiology

## 2024-07-16 NOTE — Telephone Encounter (Signed)
 Called patient per Dr. Rolan with following heart monitor results:  No worrisome bradycardia.  Pt verbalized understanding of same. No further questions at this time.

## 2024-07-19 ENCOUNTER — Other Ambulatory Visit (HOSPITAL_COMMUNITY): Payer: Self-pay | Admitting: Cardiology

## 2024-07-23 ENCOUNTER — Other Ambulatory Visit (HOSPITAL_COMMUNITY): Payer: Self-pay | Admitting: Cardiology

## 2024-08-06 ENCOUNTER — Telehealth: Payer: Self-pay | Admitting: Family

## 2024-08-06 NOTE — Telephone Encounter (Signed)
 Spoke to patient regarding her cardiomems readings not being done daily. She says that she's been having a lot of back pain which makes it difficult for her to lay down to transmit a reading. Last couple of weeks, she's been transmitting 1 reading / week. Encouraged her to try and transmit a few readings a week if she's able with her back pain. No issues with her SOB, edema or weight gain.   Seen in [facility] 08/25 and has upcoming appointment 11/25.

## 2024-08-26 ENCOUNTER — Ambulatory Visit (HOSPITAL_COMMUNITY)
Admission: RE | Admit: 2024-08-26 | Discharge: 2024-08-26 | Disposition: A | Discharge status: Home / Self Care | Source: Ambulatory Visit | Attending: Cardiology | Admitting: Cardiology

## 2024-08-26 DIAGNOSIS — I5032 Chronic diastolic (congestive) heart failure: Secondary | ICD-10-CM | POA: Diagnosis present

## 2024-08-26 LAB — PULMONARY FUNCTION TEST
DL/VA % pred: 81 %
DL/VA: 3.46 ml/min/mmHg/L
DLCO unc % pred: 51 %
DLCO unc: 8.47 ml/min/mmHg
FEF 25-75 Post: 0.44 L/s
FEF 25-75 Pre: 0.43 L/s
FEF2575-%Change-Post: 1 %
FEF2575-%Pred-Post: 31 %
FEF2575-%Pred-Pre: 31 %
FEV1-%Change-Post: 0 %
FEV1-%Pred-Post: 51 %
FEV1-%Pred-Pre: 51 %
FEV1-Post: 0.88 L
FEV1-Pre: 0.88 L
FEV1FVC-%Change-Post: -3 %
FEV1FVC-%Pred-Pre: 85 %
FEV6-%Change-Post: 2 %
FEV6-%Pred-Post: 64 %
FEV6-%Pred-Pre: 62 %
FEV6-Post: 1.41 L
FEV6-Pre: 1.37 L
FEV6FVC-%Change-Post: 0 %
FEV6FVC-%Pred-Post: 103 %
FEV6FVC-%Pred-Pre: 104 %
FVC-%Change-Post: 3 %
FVC-%Pred-Post: 62 %
FVC-%Pred-Pre: 60 %
FVC-Post: 1.43 L
FVC-Pre: 1.39 L
Post FEV1/FVC ratio: 62 %
Post FEV6/FVC ratio: 98 %
Pre FEV1/FVC ratio: 64 %
Pre FEV6/FVC Ratio: 99 %
RV % pred: 84 %
RV: 1.79 L
TLC % pred: 78 %
TLC: 3.48 L

## 2024-08-26 MED ORDER — ALBUTEROL SULFATE (2.5 MG/3ML) 0.083% IN NEBU
2.5000 mg | INHALATION_SOLUTION | Freq: Once | RESPIRATORY_TRACT | Status: AC
Start: 2024-08-26 — End: 2024-08-26
  Administered 2024-08-26: 2.5 mg via RESPIRATORY_TRACT

## 2024-09-05 ENCOUNTER — Ambulatory Visit: Admitting: Internal Medicine

## 2024-09-05 ENCOUNTER — Encounter: Payer: Self-pay | Admitting: Internal Medicine

## 2024-09-05 VITALS — BP 120/69 | HR 51 | Ht 60.0 in | Wt 199.0 lb

## 2024-09-05 DIAGNOSIS — J449 Chronic obstructive pulmonary disease, unspecified: Secondary | ICD-10-CM

## 2024-09-05 DIAGNOSIS — J9611 Chronic respiratory failure with hypoxia: Secondary | ICD-10-CM | POA: Diagnosis not present

## 2024-09-05 MED ORDER — STIOLTO RESPIMAT 2.5-2.5 MCG/ACT IN AERS: 2.0000 | INHALATION_SPRAY | Freq: Every day | RESPIRATORY_TRACT | Status: AC

## 2024-09-05 MED ORDER — STIOLTO RESPIMAT 2.5-2.5 MCG/ACT IN AERS
2.0000 | INHALATION_SPRAY | Freq: Every day | RESPIRATORY_TRACT | 11 refills | Status: AC
Start: 2024-09-05 — End: ?

## 2024-09-05 NOTE — Assessment & Plan Note (Addendum)
 Stopped smoking  2003 at wt  approx 175/ took to scooter  to shop in 2014  -  PFT's  07/10/24  FEV1 0.88 (51 % ) ratio 0.62  p 0 % improvement from saba p Breztri prior to study with DLCO  8.5 (51%)   and FV curve mildly concave and ERV 21% at wt 200  - 09/05/2024   Walked on RA @ wt  199   x    approx 75   ft  @ slow/ cane pace, stopped due to desats with lowest 02 sats 87% > advised use POC walking more than room to room at home and outside always   - 09/05/2024  After extensive coaching inhaler (smi) device,  effectiveness =  80% with respimat (short ti)       Combination of obesity / deconditioning and GOLD 2 / group B symptom and risk so best rx = LAMA/LABA  >>> try stiolot 2 each am x2 week rx and stop trelegy for now

## 2024-09-05 NOTE — Assessment & Plan Note (Addendum)
 Desats to 87% x slow walkx 62ft 09/05/2024   Advised: Make sure you check your oxygen  saturation  AT  your highest level of activity (not after you stop)   to be sure it stays over 90% and adjust  02 flow upward to maintain this level if needed but remember to turn it back to previous settings when you stop (to conserve your supply).   Will  also consider 02 at hs and ONO to prove adequate Noct sats given issues with fluid retention but she is reluctant for now to do so   Each maintenance medication was reviewed in detail including emphasizing most importantly the difference between maintenance and prns and under what circumstances the prns are to be triggered using an action plan format where appropriate.  Total time for H and P, chart review, counseling, reviewing SMI (respimat) /02/pulse ox device(s) , directly observing portions of ambulatory 02 saturation study/ and generating customized AVS unique to this office visit / same day charting = 63 min new pt eval

## 2024-09-05 NOTE — Progress Notes (Signed)
 [name], female    DOB: 06-Nov-1946    MRN: [id number]   Brief patient profile:  72   yowf  quit smoking 2003 at 89  then cabg  2008   referred to pulmonary clinic in [place]  09/05/2024 by Rolan  for doe since she retired in 2014 progressive since then.   Cardiac eval significant for small PFO with L > R shunt as of 11/09/23 echo and hyperdynamic LV with mild LAE  Pt not previously seen by PCCM service.    History of Present Illness  09/05/2024  Pulmonary/ 1st office eval/ [name] / [place] [facility] on trelegy q d x 2 month  Chief Complaint  Patient presents with   Establish Care    Shob - coughing  Pft 07/11/2023 in chart showed copd  Dyspnea:  rides scooter at [employer] since 2014 walks from [facility] to scooter maybe 50 ft  Cough: minimal in am with trelegy  Sleep: flat bed with lots of pillows  SABA use: don't really help 02: prn 2lpm plus POC / concentrator     No obvious day to day or daytime pattern/variability or assoc excess/ purulent sputum or mucus plugs or hemoptysis or cp or chest tightness, subjective wheeze or overt sinus or hb symptoms.    Also denies any obvious fluctuation of symptoms with weather or environmental changes or other aggravating or alleviating factors except as outlined above   No unusual exposure hx or h/o childhood pna/ asthma or knowledge of premature birth.  Current Allergies, Complete Past Medical History, Past Surgical History, Family History, and Social History were reviewed in [employer] record.  ROS  The following are not active complaints unless bolded Hoarseness, sore throat, dysphagia, dental problems, itching, sneezing,  nasal congestion or discharge of excess mucus or purulent secretions, ear ache,   fever, chills, sweats, unintended wt loss or wt gain, classically pleuritic or exertional cp,  orthopnea pnd or arm/hand swelling  or leg swelling, presyncope, palpitations, abdominal pain, anorexia, nausea,  vomiting, diarrhea  or change in bowel habits or change in bladder habits, change in stools or change in urine, dysuria, hematuria,  rash, arthralgias, visual complaints, headache, numbness, weakness or ataxia or problems with walking or coordination,  change in mood or  memory.            Outpatient Medications Prior to Visit  Medication Sig Dispense Refill   acetaminophen  (TYLENOL ) 500 MG tablet Take 500-1,000 mg by mouth every 6 (six) hours as needed (pain).     aspirin  EC 81 MG tablet Take 81 mg by mouth every evening. Swallow whole.     atorvastatin  (LIPITOR) 80 MG tablet Take 1 tablet (80 mg total) by mouth every evening. 90 tablet 3   FARXIGA  10 MG TABS tablet Take 1 tablet (10 mg total) by mouth daily before breakfast. 90 tablet 3   HUMALOG 100 UNIT/ML injection Inject 0.75 Units into the skin with breakfast, with lunch, and with evening meal. With insulin  pump 24 hours adjust sometime when eat     hydrALAZINE  (APRESOLINE ) 50 MG tablet Take 50 mg by mouth 3 (three) times daily.     Insulin  Disposable Pump (OMNIPOD DASH PODS, GEN 4,) MISC Take by mouth every 3 (three) days.     ipratropium (ATROVENT) 0.03 % nasal spray Place 1 spray into both nostrils 4 (four) times daily as needed (allergies).     isosorbide  dinitrate (ISORDIL ) 20 MG tablet TAKE 1 TABLET BY MOUTH THREE  TIMES DAILY 90 tablet 3   linaCLOtide (LINZESS PO) Take 1 tablet by mouth daily.     loratadine (CLARITIN) 10 MG tablet Take 10 mg by mouth daily as needed for allergies.     metolazone  (ZAROXOLYN ) 2.5 MG tablet Take 1 tablet by mouth once a week 12 tablet 0   nitroGLYCERIN  (NITROSTAT ) 0.4 MG SL tablet Place 1 tablet (0.4 mg total) under the tongue every 5 (five) minutes as needed for chest pain. 25 tablet 1   omeprazole (PRILOSEC) 20 MG capsule Take 20 mg by mouth in the morning.     OXYGEN  Inhale 2 L into the lungs daily as needed (Active).     PE-Triprolidine-DM-GG-APAP (MUCINEX CNG/CGH/COLD/FLU DY/NT PO) Patient takes  2 tablets every 4 hours as needed.     potassium chloride  (KLOR-CON  M) 10 MEQ tablet Take 2 tablets (20 mEq total) by mouth daily. 60 tablet 3   spironolactone  (ALDACTONE ) 25 MG tablet Take 1 tablet by mouth once daily 90 tablet 0   torsemide  (DEMADEX ) 20 MG tablet Take 80 mg by mouth daily. (Patient taking differently: Take 100 mg by mouth daily.)     traMADol (ULTRAM) 50 MG tablet Take 50 mg by mouth 3 (three) times daily as needed (arthritis pain).     TRELEGY ELLIPTA 200-62.5-25 MCG/ACT AEPB Inhale 1 puff into the lungs daily.     No facility-administered medications prior to visit.    Past Medical History:  Diagnosis Date   (HFpEF) heart failure with preserved ejection fraction (HCC)    a. 08/2020 Echo ([facility]): EF 65-70%, gr2 DD, mod dil LA, mild to mod TR. Mild PAH - . Mildly dil RA.   Asthma    Childhood   CAD (coronary artery disease)    a. 05/2007 s/p CABG x 4 (LIMA->LAD, VG->RI->OM, VG->RPDA; b. 09/2020 MV: small, mild apical to basal inflat reversible defect. Intermediate risk. EF 62%.   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    Type II or unspecified type diabetes mellitus without mention of complication, not stated as uncontrolled       Objective:     BP 120/69   Pulse (!) 51   Ht 5' (1.524 m)   Wt 199 lb (90.3 kg)   SpO2 91% Comment: ra  BMI 38.86 kg/m   SpO2: 91 % (ra) amb mod obese (by BMI) wf freq throat clearing/  walks with cane    HEENT : Oropharynx  clear      Nasal turbinates nl    NECK :  without  apparent JVD/ palpable Nodes/TM    LUNGS: no acc muscle use,  Nl contour chest which is clear to A and P bilaterally without cough on insp or exp maneuvers   CV:  RRR  no  s3 or murmur or increase in P2, and L>R pitting LE edema  ABD: obese  soft and nontender   MS:  Gait nl   ext warm without deformities Or obvious joint restrictions  calf tenderness, cyanosis or clubbing    SKIN: warm and dry without lesions    NEURO:  alert, approp, nl sensorium with  no motor or cerebellar deficits apparent.  Assessment   Assessment & Plan COPD GOLD 2 with desats walking Stopped smoking  2003 at wt  approx 175/ took to scooter  to shop in 2014  -  PFT's  07/10/24  FEV1 0.88 (51 % ) ratio 0.62  p 0 % improvement from saba p Breztri prior to study with DLCO  8.5 (51%)   and FV curve mildly concave and ERV 21% at wt 200  - 09/05/2024   Walked on RA @ wt  199   x    approx 75   ft  @ slow/ cane pace, stopped due to desats with lowest 02 sats 87% > advised use POC walking more than room to room at home and outside always   - 09/05/2024  After extensive coaching inhaler (smi) device,  effectiveness =  80% with respimat (short ti)       Combination of obesity / deconditioning and GOLD 2 / group B symptom and risk so best rx = LAMA/LABA  >>> try stiolot 2 each am x2 week rx and stop trelegy for now     Chronic respiratory failure with hypoxia (HCC) Desats to 87% x slow walkx 64ft 09/05/2024   Advised: Make sure you check your oxygen  saturation  AT  your highest level of activity (not after you stop)   to be sure it stays over 90% and adjust  02 flow upward to maintain this level if needed but remember to turn it back to previous settings when you stop (to conserve your supply).   Will  also consider 02 at hs and ONO to prove adequate Noct sats given issues with fluid retention but she is reluctant for now to do so   Each maintenance medication was reviewed in detail including emphasizing most importantly the difference between maintenance and prns and under what circumstances the prns are to be triggered using an action plan format where appropriate.  Total time for  H and P, chart review, counseling, reviewing SMI (respimat) /02/pulse ox device(s) , directly observing portions of ambulatory 02 saturation study/ and generating customized AVS unique to this office visit / same day charting = 63 min new pt eval                  AVS  Patient Instructions  Try off trelegy and start Stiolto 2 puffs each am x 2 weeks and fill the prescription if it helps your breathing while walking    Make sure you check your oxygen  saturation  AT  your highest level of activity (not after you stop)   to be sure it stays over 90% and adjust  02 flow upward to maintain this level if needed but remember to turn it back to previous settings when you stop (to conserve your supply).     Please schedule a follow up office visit in 6 weeks, call sooner if needed with all respiratory medications /inhalers and your portable 02   Add needs cxr on return       Ozell America, MD 09/05/2024

## 2024-09-05 NOTE — Patient Instructions (Addendum)
 Try off trelegy and start Stiolto 2 puffs each am x 2 weeks and fill the prescription if it helps your breathing while walking    Make sure you check your oxygen  saturation  AT  your highest level of activity (not after you stop)   to be sure it stays over 90% and adjust  02 flow upward to maintain this level if needed but remember to turn it back to previous settings when you stop (to conserve your supply).     Please schedule a follow up office visit in 6 weeks, call sooner if needed with all respiratory medications /inhalers and your portable 02   Add needs cxr on return

## 2024-09-20 ENCOUNTER — Other Ambulatory Visit (HOSPITAL_COMMUNITY): Payer: Self-pay | Admitting: Cardiology

## 2024-10-10 ENCOUNTER — Encounter (HOSPITAL_COMMUNITY)

## 2024-10-13 ENCOUNTER — Ambulatory Visit: Admitting: Internal Medicine

## 2024-10-13 DIAGNOSIS — J449 Chronic obstructive pulmonary disease, unspecified: Secondary | ICD-10-CM

## 2024-10-13 DIAGNOSIS — J9611 Chronic respiratory failure with hypoxia: Secondary | ICD-10-CM

## 2024-10-13 NOTE — Progress Notes (Deleted)
 [name], female    DOB: [birth year]    MRN: [id number]   Brief patient profile:  3   yowf  quit smoking 2003 at 89  then cabg  2008   referred to pulmonary clinic in [place]  09/05/2024 by Rolan  for doe since she retired in 2014 progressive since then.   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 walks from [facility] to scooter maybe 50 ft  Cough: minimal in am with trelegy  Sleep: flat bed with lots of pillows  SABA use: don't really help 02: prn 2lpm plus POC / concentrator Patient Instructions  Try off trelegy and start Stiolto 2 puffs each am x 2 weeks and fill the prescription if it helps your breathing while walking  Make sure you check your oxygen  saturation  AT  your highest level of activity (not after you stop)   to be sure it stays over 90%  Please schedule a follow up office visit in 6 weeks, call sooner if needed with all respiratory medications /inhalers and your portable 02   Add needs cxr on return      10/13/2024  f/u ov/[place] office/[name] re: GOLD 2 ccopd with desats walking  maint on ***  No chief complaint on file.   Dyspnea:  *** Cough: *** Sleeping: ***   resp cc  SABA use: *** 02: ***  Lung cancer screening: ***   No obvious day to day or daytime variability or assoc excess/ purulent sputum or mucus plugs or hemoptysis or cp or chest tightness, subjective wheeze or overt sinus or hb symptoms.    Also denies any obvious fluctuation of symptoms with weather or environmental changes or other aggravating or alleviating factors except as outlined above   No unusual exposure hx or h/o childhood  pna/ asthma or knowledge of premature birth.  Current Allergies, Complete Past Medical History, Past Surgical History, Family History, and Social History were reviewed in [employer] record.  ROS  The following are not active complaints unless bolded Hoarseness, sore throat, dysphagia, dental problems, itching, sneezing,  nasal congestion or discharge of excess mucus or purulent secretions, ear ache,   fever, chills, sweats, unintended wt loss or wt gain, classically pleuritic or exertional cp,  orthopnea pnd or arm/hand swelling  or leg swelling, presyncope, palpitations, abdominal pain, anorexia, nausea, vomiting, diarrhea  or change in bowel habits or change in bladder habits, change in stools or change in urine, dysuria, hematuria,  rash, arthralgias, visual complaints, headache, numbness, weakness or ataxia or problems with walking or coordination,  change in mood or  memory.        No outpatient medications have been marked as taking for the 10/13/24 encounter (Appointment) with Tanya Ozell NOVAK, MD.         Past Medical History:  Diagnosis Date   (HFpEF) heart failure with preserved ejection fraction (HCC)    a. 08/2020 Echo ([facility]): EF 65-70%, gr2 DD, mod dil LA, mild to mod TR. Mild PAH - . Mildly dil RA.   Asthma  Childhood   CAD (coronary artery disease)    a. 05/2007 s/p CABG x 4 (LIMA->LAD, VG->RI->OM, VG->RPDA; b. 09/2020 MV: small, mild apical to basal inflat reversible defect. Intermediate risk. EF 62%.   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       Assessment

## 2024-10-17 ENCOUNTER — Other Ambulatory Visit (HOSPITAL_COMMUNITY): Payer: Self-pay | Admitting: Cardiology

## 2024-10-20 ENCOUNTER — Other Ambulatory Visit (HOSPITAL_COMMUNITY): Payer: Self-pay | Admitting: Cardiology

## 2024-10-21 ENCOUNTER — Ambulatory Visit (HOSPITAL_COMMUNITY)

## 2024-10-29 ENCOUNTER — Telehealth (HOSPITAL_COMMUNITY): Payer: Self-pay

## 2024-10-29 NOTE — Telephone Encounter (Signed)
 Called to confirm/remind patient of their appointment at the [facility] on 10/30/24.   Appointment:   [x] Confirmed  [] Left mess   [] No answer/No voice mail  [] VM Full/unable to leave message  [] Phone not in service  Patient reminded to bring all medications and/or complete list.  Confirmed patient has transportation. Gave directions, instructed to utilize valet parking.

## 2024-10-30 ENCOUNTER — Ambulatory Visit (HOSPITAL_COMMUNITY)
Admission: RE | Admit: 2024-10-30 | Discharge: 2024-10-30 | Disposition: A | Source: Ambulatory Visit | Attending: Physician Assistant

## 2024-10-30 ENCOUNTER — Other Ambulatory Visit (HOSPITAL_COMMUNITY): Payer: Self-pay

## 2024-10-30 VITALS — BP 160/64 | HR 62 | Wt 181.8 lb

## 2024-10-30 DIAGNOSIS — I10 Essential (primary) hypertension: Secondary | ICD-10-CM

## 2024-10-30 DIAGNOSIS — N184 Chronic kidney disease, stage 4 (severe): Secondary | ICD-10-CM

## 2024-10-30 DIAGNOSIS — R001 Bradycardia, unspecified: Secondary | ICD-10-CM | POA: Diagnosis not present

## 2024-10-30 DIAGNOSIS — I5032 Chronic diastolic (congestive) heart failure: Secondary | ICD-10-CM

## 2024-10-30 MED ORDER — SPIRONOLACTONE 50 MG PO TABS: 50.0000 mg | ORAL_TABLET | Freq: Every day | ORAL | 1 refills | Status: AC

## 2024-10-30 NOTE — Patient Instructions (Addendum)
 Good to see you today!  INCREASE spironolactone  to 50 mg ( 1 tablet) daily  Lab work in 1 week in [place]   Your physician recommends that you schedule a follow-up appointment as scheduled  If you have any questions or concerns before your next appointment please send us  a message through [place] or call our office at [phone].    TO LEAVE A MESSAGE FOR THE NURSE SELECT OPTION 2, PLEASE LEAVE A MESSAGE INCLUDING: YOUR NAME DATE OF BIRTH CALL BACK NUMBER REASON FOR CALL**this is important as we prioritize the call backs  YOU WILL RECEIVE A CALL BACK THE SAME DAY AS LONG AS YOU CALL BEFORE 4:00 PM At the [facility], you and your health needs are our priority. As part of our continuing mission to provide you with exceptional heart care, we have created designated Provider Care Teams. These Care Teams include your primary Cardiologist (physician) and Advanced Practice Providers (APPs- Physician Assistants and Nurse Practitioners) who all work together to provide you with the care you need, when you need it.   You may see any of the following providers on your designated Care Team at your next follow up: Dr Toribio Fuel Dr Ezra Shuck Dr. Morene Brownie Greig Mosses, NP Caffie Shed, [place] [facility] [place], [place] Beckey Coe, NP Jordan Lee, NP Ellouise Class, NP Tinnie Redman, PharmD Jaun Bash, PharmD   Please be sure to bring in all your medications bottles to every appointment.    Thank you for choosing [facility]

## 2024-10-30 NOTE — Progress Notes (Addendum)
 PCP: Orpha Yancey LABOR, MD Cardiology: Dr. Alvan HF Cardiology: Dr. Rolan  Chief complaint: CHF  78 y.o. with history of CAD, diastolic CHF, and poorly controlled HTN was referred by Dr. Alvan for CHF evaluation. Patient had CABG in 2008.  She has poorly controlled HTN and CKD stage 3.  She was admitted in 10/21 with hypertensive urgency and CHF due to medication noncompliance.  She was admitted again in 3/22 with acute diastolic CHF, AKI to 2.9. HS-TnI was 1777 but cath was not done due to AKI.  Echo showed EF 60-65% with normal RV.  She was again admitted (this time to [facility]) in 5/22 with CHF.  No med changes were made.  Given multiple admission with CHF and difficulty managing volume, she was referred to CHF clinic.  Torsemide  was increased and Jardiance  was started.   LHC/RHC in 6/22 showed mildly elevated RA pressure, normal PCWP.  LIMA-LAD and SVG-PDA were patent, sequential SVG-OM and ramus only touched down on OM.  Native RCA has a heavily calcified ostium with 95% stenosis, very difficult to seat the catheter due to calcification.  The native PDA is occluded at the ostium, and SVG-PDA only fills the PDA (does not back-fill to PLV and RCA).  Therefore, PLV territory is likely jeopardized by the ostial RCA stenosis. I reviewed the films with Dr. Jordan.  Potentially could intervene on ostial RCA using atherectomy (would be difficult).  As patient's primary symptoms have been CHF-related and this was improved with diuresis, will manage medically now with option for PCI if she develops chest pain, etc.  Echo in 3/25 showed EF 55-60%, mild LVH, moderate diastolic dysfunction, normal RV size/systolic function.   Bradycardia has been noted, zio 07/25 with sinus brady, average HR 53 bpm, no significant pauses.  Here today for CHF follow-up. She has been recovering from a recent upper respiratory infection. Had nagging cough associated with chest wall soreness for about 2 weeks. No orthopnea,  PND or worsening lower extremity edema. States her left lower extremity has always been bigger than her right. States she lost 10-12 lbs while sick. Taking all medications as prescribed. 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 Mostly limited by chronic back pain that radiates into the hip.  Taking all meds as prescribed. SBP averages 140/80 at home.    Cardiomems today: PAd goal 18, no readings X 2 weeks while sick. Plans to restart readings soon.   PMH: 1. Hyperlipidemia 2. HTN 3. CAD: CABG 2008 with LIMA-LAD, SVG-ramus, SVG-OM, SVG-PDA.  - Cardiolite (11/21): Small reversible inferolateral defect.  - LHC (6/22): LIMA-LAD and SVG-PDA were patent, sequential SVG-OM and ramus only touched down on OM.  Native RCA has a heavily calcified ostium with 95% stenosis, very difficult to seat the catheter due to calcification.  The native PDA is occluded at the ostium, and SVG-PDA only fills the PDA (does not back-fill to PLV and RCA).  Therefore, PLV territory is likely jeopardized by the ostial RCA stenosis. I reviewed the films with Dr. Jordan.  Potentially could intervene on ostial RCA using atherectomy (would be difficult).  As patient's primary symptoms are CHF-related and this is improved with diuresis, will manage medically now with option for PCI if she develops chest pain, etc. 4. Chronic diastolic CHF: Echo (3/22) with EF 60-65%, normal RV.  - Multiple admission with CHF.  - RHC (6/22): mean RA 10, PA 42/15 mean 25, mean PCWP 13, CI 3.72, PVR 1.7 WU.  - Cardiomems - Echo (  3/25): EF 55-60%, mild LVH, moderate diastolic dysfunction, normal RV size/systolic function.  5. CKD: Stage 3.  6. Sinus bradycardia 7. COPD  SH: Lives in [place], retired from [employer], lives alone, has a son in [place].  Quit smoking in 2003, no ETOH.   Family History  Problem Relation Age of Onset   Cancer Other        Family Hx of Cancer   Coronary artery disease Other        Family  Hx of CAD   ROS: All systems reviewed and negative except as per HPI.   Current Outpatient Medications  Medication Sig Dispense Refill   acetaminophen  (TYLENOL ) 500 MG tablet Take 500-1,000 mg by mouth every 6 (six) hours as needed (pain).     aspirin  EC 81 MG tablet Take 81 mg by mouth every evening. Swallow whole.     atorvastatin  (LIPITOR) 80 MG tablet Take 1 tablet (80 mg total) by mouth every evening. 90 tablet 3   FARXIGA  10 MG TABS tablet Take 1 tablet (10 mg total) by mouth daily before breakfast. 90 tablet 3   HUMALOG 100 UNIT/ML injection Inject 0.75 Units into the skin with breakfast, with lunch, and with evening meal. With insulin  pump 24 hours adjust sometime when eat     hydrALAZINE  (APRESOLINE ) 50 MG tablet Take 50 mg by mouth 3 (three) times daily.     Insulin  Disposable Pump (OMNIPOD DASH PODS, GEN 4,) MISC Take by mouth every 3 (three) days.     ipratropium (ATROVENT) 0.03 % nasal spray Place 1 spray into both nostrils 4 (four) times daily as needed (allergies).     isosorbide  dinitrate (ISORDIL ) 20 MG tablet TAKE 1 TABLET BY MOUTH THREE TIMES DAILY 90 tablet 3   linaCLOtide (LINZESS PO) Take 1 tablet by mouth daily.     loratadine (CLARITIN) 10 MG tablet Take 10 mg by mouth daily as needed for allergies.     metolazone  (ZAROXOLYN ) 2.5 MG tablet Take 1 tablet by mouth once a week 12 tablet 0   nitroGLYCERIN  (NITROSTAT ) 0.4 MG SL tablet Place 1 tablet (0.4 mg total) under the tongue every 5 (five) minutes as needed for chest pain. 25 tablet 1   omeprazole (PRILOSEC) 20 MG capsule Take 20 mg by mouth in the morning.     OXYGEN  Inhale 2 L into the lungs daily as needed (Active).     PE-Triprolidine-DM-GG-APAP (MUCINEX CNG/CGH/COLD/FLU DY/NT PO) Patient takes 2 tablets every 4 hours as needed.     potassium chloride  (KLOR-CON  M) 10 MEQ tablet Take 2 tablets (20 mEq total) by mouth daily. 60 tablet 3   Tiotropium Bromide-Olodaterol (STIOLTO RESPIMAT ) 2.5-2.5 MCG/ACT AERS Inhale 2  puffs into the lungs daily.     torsemide  (DEMADEX ) 20 MG tablet Take 80 mg by mouth daily.     traMADol (ULTRAM) 50 MG tablet Take 50 mg by mouth 3 (three) times daily as needed (arthritis pain).     spironolactone  (ALDACTONE ) 50 MG tablet Take 1 tablet (50 mg total) by mouth daily. 90 tablet 1   Tiotropium Bromide-Olodaterol (STIOLTO RESPIMAT ) 2.5-2.5 MCG/ACT AERS Inhale 2 puffs into the lungs daily. 1 each 11   No current facility-administered medications for this encounter.   Wt Readings from Last 3 Encounters:  10/30/24 82.5 kg (181 lb 12.8 oz)  09/05/24 90.3 kg (199 lb)  07/10/24 91 kg (200 lb 9.6 oz)   BP (!) 160/64 (BP Location: Right Arm, Patient Position: Sitting)  Pulse 62   Wt 82.5 kg (181 lb 12.8 oz)   SpO2 92%   BMI 35.51 kg/m  General:  Elderly female. No distress. Arrived in wheelchair. Cor: Regular rate & rhythm. No murmurs. Lungs: diminished Abdomen: obese, soft, nontender, nondistended. Extremities: trace - 1 + edema on left, no edema on right Neuro: alert & orientedx3. Affect pleasant  Labs at PCP's office 10/15/24: Scr 1.8, Na 140, K 3.2, Cl 94, A1c 8.5%, LDL 65, proBNP 2175  Assessment/Plan: 1. Chronic diastolic CHF: Echo in 3/22 with EF 60-65%, normal RV.  She has had only mild LVH on echo so suspect cardiac amyloidosis is unlikely.  Multiple CHF admissions.  Volume management is complicated by CKD stage 3.  Echo in 3/25 showed EF 55-60%, mild LVH, moderate diastolic dysfunction, normal RV size/systolic function. Chronic NYHA class III symptoms.  Symptoms complicated by COPD (on Trelegy) and chronic back pain. Not overloaded by exam. Plans to restart Cardiomems readings. - Continue 80 mg Torsemide  daily and 2.5 mg metolazone  once weekly. - Continue Farxiga  10 mg daily.   - Increase spiro to 50 mg daily d/t low K and elevated BP. - BMET 1 week 2. HTN: BP elevated here and at home. - Continue hydralazine  50 tid (thinks she takes BID sometimes) - Increase  spiro as above 3. CAD: S/p CABG in 2008.  HS-TnI up to 1777 during 3/22 admission but no cath due to AKI.  She has prominent exertional dyspnea but only atypical chest pain.  LHC in 6/22 showed that LIMA-LAD and SVG-PDA were patent, sequential SVG-OM and ramus only touched down on OM.  Native RCA has a heavily calcified ostium with 95% stenosis, very difficult to seat the catheter due to calcification.  The native PDA is occluded at the ostium, and SVG-PDA only fills the PDA (does not back-fill to PLV and RCA).  Therefore, PLV territory is likely jeopardized by the ostial RCA stenosis. I reviewed the films with Dr. Jordan.  Potentially could intervene on ostial RCA using atherectomy (would be difficult).  As patient's primary symptoms are CHF-related, will manage medically now with option for PCI if she develops chest pain, etc. No chest pain.  - Continue ASA 81 daily.  - Continue atorvastatin , LDL 65 in 11/25 4. Suspect OSA: She says that she would not wear CPAP, so sleep study not ordered.  5. CKD stage 3b/4: Last creatinine 1.8 - PCP referred to Nephrology 6. Type 2 Diabetes: On insulin  pump per PCP.  - On Farxiga  - Last A1c was 8.8 7. Obesity: Body mass index is 35.51 kg/m. - Consider GLP-1 agonist.  8. COPD: Suspect COPD, she is on Trelegy.  Prior smoker.  Oxygen  saturation dropped to 85% with exertion last visit in 8/25.  - Needs to use oxygen  with exertion, has at home.  - PFTs in 8/25 with severe obstructive airway disease 9. Sinus bradycardia:  No lightheadedness.  - Avoid nodal blockers.   - Wore Zio monitor x 2 wks 7/25, sinus brady average rate 50s with no significant pauses  Follow up 4 months with APP   [facility], Monifah Freehling N  10/30/2024

## 2024-11-06 ENCOUNTER — Other Ambulatory Visit (HOSPITAL_COMMUNITY): Payer: Self-pay | Admitting: Cardiology

## 2024-11-12 ENCOUNTER — Ambulatory Visit (HOSPITAL_COMMUNITY)
Admission: RE | Admit: 2024-11-12 | Discharge: 2024-11-12 | Disposition: A | Discharge status: Home / Self Care | Source: Ambulatory Visit | Attending: Internal Medicine | Admitting: Internal Medicine

## 2024-11-12 ENCOUNTER — Encounter: Payer: Self-pay | Admitting: Internal Medicine

## 2024-11-12 ENCOUNTER — Ambulatory Visit: Admitting: Internal Medicine

## 2024-11-12 VITALS — BP 136/53 | HR 57 | Ht 60.0 in | Wt 193.0 lb

## 2024-11-12 DIAGNOSIS — J449 Chronic obstructive pulmonary disease, unspecified: Secondary | ICD-10-CM | POA: Diagnosis present

## 2024-11-12 DIAGNOSIS — Z87891 Personal history of nicotine dependence: Secondary | ICD-10-CM | POA: Diagnosis not present

## 2024-11-12 DIAGNOSIS — J9611 Chronic respiratory failure with hypoxia: Secondary | ICD-10-CM

## 2024-11-12 NOTE — Patient Instructions (Addendum)
 Please remember to go to the  x-ray department  @  [facility] for your tests - we will call you with the results when they are available     Work on inhaler technique:  relax and gently blow all the way out then take a nice smooth full deep breath back in, triggering the inhaler at same time you start breathing in.  Hold breath in for at least  5 seconds if you can.   Make sure you check your oxygen  saturation  AT  your highest level of activity (not after you stop)   to be sure it stays over 90% and adjust  02 flow upward to maintain this level if needed but remember to turn it back to previous settings when you stop (to conserve your supply).   Please schedule a follow up visit in 6  months but call sooner if needed

## 2024-11-12 NOTE — Assessment & Plan Note (Addendum)
 Desats to 87% x slow walk x 31ft 09/05/2024   She has minimal symptoms from chronic hypoxemia as she has such a low Ventilatory demand due to her extremely sedentary lifestyle but is not interested in pulmonary rehab as long as she is so limited by her back  Advised on dangers of polycythemia/ RH failure and rec:  >>> adjust 02 to keep sats low 90s       Each maintenance medication was reviewed in detail including emphasizing most importantly the difference between maintenance and prns and under what circumstances the prns are to be triggered using an action plan format where appropriate.  Total time for H and P, chart review, counseling, reviewing SMI (respimat) 02 and pulse ox  device(s) and generating customized AVS unique to this office visit / same day charting = 32 m min

## 2024-11-12 NOTE — Progress Notes (Signed)
 [name], female    DOB: [birth year]    MRN: [id number]   Brief patient profile:  9   yowf  quit smoking 2003 at 89  then cabg  2008   referred to pulmonary clinic in [place]  09/05/2024 by Rolan  for doe since she retired in 2014 progressive since then.   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 walks from [facility] to scooter maybe 50 ft  Cough: minimal in am with trelegy  Sleep: flat bed with lots of pillows  SABA use: don't really help 02: prn 2lpm plus POC / concentrator Patient Instructions  Try off trelegy and start Stiolto 2 puffs each am x 2 weeks and fill the prescription if it helps your breathing while walking  Make sure you check your oxygen  saturation  AT  your highest level of activity (not after you stop)   to be sure it stays over 90%  Please schedule a follow up office visit in 6 weeks, call sooner if needed with all respiratory medications /inhalers and your portable 02         11/12/2024  f/u ov/[place] office/Asher Torpey re: GOLD 2 copd with desats walking  maint on stiloto  Chief Complaint  Patient presents with   COPD    F/u   Dyspnea:  limited by back more than breathing  Cough: not  a problem  Sleeping: flat bed 5 pillows on side > usually no noct    resp cc  SABA use: none  02: prn     No obvious day to day or daytime variability or assoc excess/ purulent sputum or mucus plugs or hemoptysis or cp or chest tightness, subjective wheeze or overt sinus or hb symptoms.    Also denies any obvious fluctuation of symptoms with weather or environmental changes or other aggravating or alleviating factors  except as outlined above   No unusual exposure hx or h/o childhood pna/ asthma or knowledge of premature birth.  Current Allergies, Complete Past Medical History, Past Surgical History, Family History, and Social History were reviewed in [employer] record.  ROS  The following are not active complaints unless bolded Hoarseness, sore throat, dysphagia, dental problems, itching, sneezing,  nasal congestion or discharge of excess mucus or purulent secretions, ear ache,   fever, chills, sweats, unintended wt loss or wt gain, classically pleuritic or exertional cp,  orthopnea pnd or arm/hand swelling  or leg swelling, presyncope, palpitations, abdominal pain, anorexia, nausea, vomiting, diarrhea  or change in bowel habits or change in bladder habits, change in stools or change in urine, dysuria, hematuria,  rash, arthralgias, visual complaints, headache, numbness, weakness or ataxia or problems with walking or coordination,  change in mood or  memory.        Current Meds  Medication Sig   acetaminophen  (TYLENOL ) 500 MG tablet Take 500-1,000 mg by mouth every 6 (six) hours as needed (pain).   aspirin  EC 81 MG tablet Take 81 mg by mouth every evening. Swallow whole.   atorvastatin  (LIPITOR) 80 MG tablet Take 1  tablet (80 mg total) by mouth every evening.   FARXIGA  10 MG TABS tablet Take 1 tablet (10 mg total) by mouth daily before breakfast.   HUMALOG 100 UNIT/ML injection Inject 0.75 Units into the skin with breakfast, with lunch, and with evening meal. With insulin  pump 24 hours adjust sometime when eat   hydrALAZINE  (APRESOLINE ) 50 MG tablet Take 50 mg by mouth 3 (three) times daily.   Insulin  Disposable Pump (OMNIPOD DASH PODS, GEN 4,) MISC Take by mouth every 3 (three) days.   ipratropium (ATROVENT) 0.03 % nasal spray Place 1 spray into both nostrils 4 (four) times daily as needed (allergies).   isosorbide  dinitrate (ISORDIL ) 20 MG tablet TAKE 1 TABLET BY MOUTH THREE TIMES  DAILY   linaCLOtide (LINZESS PO) Take 1 tablet by mouth daily.   loratadine (CLARITIN) 10 MG tablet Take 10 mg by mouth daily as needed for allergies.   metolazone  (ZAROXOLYN ) 2.5 MG tablet Take 1 tablet by mouth once a week   nitroGLYCERIN  (NITROSTAT ) 0.4 MG SL tablet Place 1 tablet (0.4 mg total) under the tongue every 5 (five) minutes as needed for chest pain.   omeprazole (PRILOSEC) 20 MG capsule Take 20 mg by mouth in the morning.   OXYGEN  Inhale 2 L into the lungs daily as needed (Active).   PE-Triprolidine-DM-GG-APAP (MUCINEX CNG/CGH/COLD/FLU DY/NT PO) Patient takes 2 tablets every 4 hours as needed.   potassium chloride  (KLOR-CON  M) 10 MEQ tablet Take 2 tablets (20 mEq total) by mouth daily.   spironolactone  (ALDACTONE ) 50 MG tablet Take 1 tablet (50 mg total) by mouth daily.   Tiotropium Bromide-Olodaterol (STIOLTO RESPIMAT ) 2.5-2.5 MCG/ACT AERS Inhale 2 puffs into the lungs daily.   torsemide  (DEMADEX ) 20 MG tablet Take 80 mg by mouth daily.   traMADol (ULTRAM) 50 MG tablet Take 50 mg by mouth 3 (three) times daily as needed (arthritis pain).         Past Medical History:  Diagnosis Date   (HFpEF) heart failure with preserved ejection fraction (HCC)    a. 08/2020 Echo ([facility]): EF 65-70%, gr2 DD, mod dil LA, mild to mod TR. Mild PAH - . Mildly dil RA.   Asthma    Childhood   CAD (coronary artery disease)    a. 05/2007 s/p CABG x 4 (LIMA->LAD, VG->RI->OM, VG->RPDA; b. 09/2020 MV: small, mild apical to basal inflat reversible defect. Intermediate risk. EF 62%.   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    Type II or unspecified type diabetes mellitus without mention of complication, not stated as  uncontrolled       Objective:     Wt Readings from Last 3 Encounters:  11/12/24 193 lb (87.5 kg)  10/30/24 181 lb 12.8 oz (82.5 kg)  09/05/24 199 lb (90.3 kg)    Vital signs reviewed  11/12/2024  - Note at rest 02 sats  86% on RA   General appearance:    elderly wf slt tremor walks with cane   Late exp rhonchi        HEENT : Oropharynx  clear   Nasal turbinates nl    NECK :  without  apparent JVD/ palpable Nodes/TM    LUNGS: no  acc muscle use,  Mild barrel  contour chest wall with bilateral  Distant exp rhonchi nd  without cough on insp or exp maneuvers  and mild  Hyperresonant  to  percussion bilaterally     CV:  RRR  no s3 or murmur or increase in P2, and pitting edema L > R LE   ABD:  soft and nontender   MS:   ext warm without deformities Or obvious joint restrictions  calf tenderness, cyanosis or clubbing    SKIN: warm and dry without lesions    NEURO:  alert, approp, nl sensorium with  no motor or cerebellar deficits apparent.    CXR PA and Lateral:   11/12/2024 :    I personally reviewed images and impression is as follows:     Mod kyphosis, mild CM, no overt ILD or CHF    Assessment   Assessment & Plan COPD GOLD 2 with desats walking Stopped smoking  2003 at wt  approx 175/ took to scooter  to shop in 2014  -  PFT's  07/10/24  FEV1 0.88 (51 % ) ratio 0.62  p 0 % improvement from saba p Breztri prior to study with DLCO  8.5 (51%)   and FV curve mildly concave and ERV 21% at wt 200  - 09/05/2024   Walked on RA @ wt  199   x    approx 75   ft  @ slow/ cane pace, stopped due to desats with lowest 02 sats 87% > advised use POC walking more than room to room at home and outside always    - 09/05/2024  After extensive coaching inhaler (smi) device,  effectiveness =  80% with respimat (short ti)     -11/12/2024  After extensive coaching inhaler device,  effectiveness =    75% with SMI (inps vol low, late insp or early trigger)    Pt is Group B in terms of  symptom/risk and laba/lama therefore appropriate rx at this point >>>  no change rx = stiolto 2 puffs each am     Chronic respiratory failure with hypoxia (HCC) Desats to 87% x slow walk x 40ft 09/05/2024   She has minimal symptoms from chronic hypoxemia as she has such a low Ventilatory demand due to her extremely sedentary lifestyle but is not interested in pulmonary rehab as long as she is so limited by her back  Advised on dangers of polycythemia/ RH failure and rec:  >>> adjust 02 to keep sats low 90s       Each maintenance medication was reviewed in detail including emphasizing most importantly the difference between maintenance and prns and under what circumstances the prns are to be triggered using an action plan format where appropriate.  Total time for H and P, chart review, counseling, reviewing SMI (respimat) 02 and pulse ox  device(s) and generating customized AVS unique to this office visit / same day charting = 32 m min          AVS  Patient Instructions  Please remember to go to the  x-ray department  @  [facility] for your tests - we will call you with the results when they are available     Work on inhaler technique:  relax and gently blow all the way out then take a nice smooth full deep breath back in, triggering the inhaler at same time you start breathing in.  Hold breath in for at least  5 seconds if  you can.   Make sure you check your oxygen  saturation  AT  your highest level of activity (not after you stop)   to be sure it stays over 90% and adjust  02 flow upward to maintain this level if needed but remember to turn it back to previous settings when you stop (to conserve your supply).   Please schedule a follow up visit in 6  months but call sooner if needed              Ozell America, MD 11/12/2024

## 2024-11-12 NOTE — Assessment & Plan Note (Signed)
 Stopped smoking  2003 at wt  approx 175/ took to scooter  to shop in 2014  -  PFT's  07/10/24  FEV1 0.88 (51 % ) ratio 0.62  p 0 % improvement from saba p Breztri prior to study with DLCO  8.5 (51%)   and FV curve mildly concave and ERV 21% at wt 200  - 09/05/2024   Walked on RA @ wt  199   x    approx 75   ft  @ slow/ cane pace, stopped due to desats with lowest 02 sats 87% > advised use POC walking more than room to room at home and outside always    - 09/05/2024  After extensive coaching inhaler (smi) device,  effectiveness =  80% with respimat (short ti)     -11/12/2024  After extensive coaching inhaler device,  effectiveness =    75% with SMI (inps vol low, late insp or early trigger)    Pt is Group B in terms of symptom/risk and laba/lama therefore appropriate rx at this point >>>  no change rx = stiolto 2 puffs each am

## 2024-11-13 ENCOUNTER — Ambulatory Visit (HOSPITAL_COMMUNITY): Payer: Self-pay | Admitting: Physician Assistant

## 2024-11-13 DIAGNOSIS — I5032 Chronic diastolic (congestive) heart failure: Secondary | ICD-10-CM

## 2024-11-13 LAB — BASIC METABOLIC PANEL WITH GFR
BUN/Creatinine Ratio: 19 (ref 12–28)
BUN: 38 mg/dL — ABNORMAL HIGH (ref 8–27)
CO2: 28 mmol/L (ref 20–29)
Calcium: 9.3 mg/dL (ref 8.7–10.3)
Chloride: 91 mmol/L — ABNORMAL LOW (ref 96–106)
Creatinine, Ser: 2 mg/dL — ABNORMAL HIGH (ref 0.57–1.00)
Glucose: 155 mg/dL — ABNORMAL HIGH (ref 70–99)
Potassium: 5.1 mmol/L (ref 3.5–5.2)
Sodium: 135 mmol/L (ref 134–144)
eGFR: 25 mL/min/1.73 — ABNORMAL LOW (ref >59)

## 2024-11-30 ENCOUNTER — Other Ambulatory Visit (HOSPITAL_COMMUNITY): Payer: Self-pay | Admitting: Cardiology

## 2024-12-01 ENCOUNTER — Ambulatory Visit: Payer: Self-pay | Admitting: Internal Medicine

## 2024-12-02 NOTE — Progress Notes (Signed)
 Called and relayed recs to pt and she confirmed

## 2024-12-02 NOTE — Progress Notes (Signed)
 Called and relayed results to pt and she confirm understanding   Pt states she does have swelling in her left leg every so often she mentioned what to increase her fluid pill to if she notice swelling - she is already doing this when she see her leg swelling but wants to make sure she is increasing it correctly

## 2024-12-03 ENCOUNTER — Other Ambulatory Visit (HOSPITAL_COMMUNITY): Payer: Self-pay | Admitting: Cardiology

## 2024-12-03 DIAGNOSIS — I5032 Chronic diastolic (congestive) heart failure: Secondary | ICD-10-CM

## 2024-12-04 ENCOUNTER — Ambulatory Visit (HOSPITAL_COMMUNITY): Payer: Self-pay | Admitting: Physician Assistant

## 2024-12-04 LAB — BASIC METABOLIC PANEL WITH GFR
BUN/Creatinine Ratio: 14 (ref 12–28)
BUN: 24 mg/dL (ref 8–27)
CO2: 31 mmol/L — ABNORMAL HIGH (ref 20–29)
Calcium: 9.3 mg/dL (ref 8.7–10.3)
Chloride: 90 mmol/L — ABNORMAL LOW (ref 96–106)
Creatinine, Ser: 1.76 mg/dL — ABNORMAL HIGH (ref 0.57–1.00)
Glucose: 220 mg/dL — ABNORMAL HIGH (ref 70–99)
Potassium: 4.1 mmol/L (ref 3.5–5.2)
Sodium: 135 mmol/L (ref 134–144)
eGFR: 30 mL/min/1.73 — ABNORMAL LOW

## 2024-12-19 ENCOUNTER — Other Ambulatory Visit (HOSPITAL_COMMUNITY): Payer: Self-pay | Admitting: Nephrology

## 2024-12-19 DIAGNOSIS — I129 Hypertensive chronic kidney disease with stage 1 through stage 4 chronic kidney disease, or unspecified chronic kidney disease: Secondary | ICD-10-CM

## 2024-12-19 DIAGNOSIS — N184 Chronic kidney disease, stage 4 (severe): Secondary | ICD-10-CM

## 2025-02-27 ENCOUNTER — Ambulatory Visit (HOSPITAL_COMMUNITY)
